# Patient Record
Sex: Male | Born: 1935 | Race: White | Hispanic: No | Marital: Married | Smoking: Former smoker
Health system: Southern US, Community
[De-identification: ages and names within clinical notes are randomized; demographics above are authoritative.]

## PROBLEM LIST (undated history)

## (undated) DIAGNOSIS — C61 Malignant neoplasm of prostate: Secondary | ICD-10-CM

## (undated) DIAGNOSIS — C801 Malignant (primary) neoplasm, unspecified: Secondary | ICD-10-CM

## (undated) HISTORY — PX: COLOSTOMY: SHX63

## (undated) HISTORY — PX: MELANOMA EXCISION: SHX5266

---

## 1989-05-30 HISTORY — PX: PROSTATE SURGERY: SHX751

## 2014-05-30 DIAGNOSIS — C19 Malignant neoplasm of rectosigmoid junction: Secondary | ICD-10-CM

## 2014-05-30 HISTORY — DX: Malignant neoplasm of rectosigmoid junction: C19

## 2019-02-22 ENCOUNTER — Telehealth: Payer: Self-pay | Admitting: Oncology

## 2019-02-22 NOTE — Telephone Encounter (Signed)
I received a referral from [facility] for Cody Hurley to establish care with [name]. I attempted 2x to call the number provider but the phone line was busy. I will try back at a later time.

## 2019-03-11 ENCOUNTER — Telehealth: Payer: Self-pay | Admitting: Oncology

## 2019-03-11 NOTE — Telephone Encounter (Signed)
Spoke with wife re 10/26 new patient appointment with [name]. Per wife insurance is medicare and freedom blue. Patient will bring insurance card to visit. Reached out per GI navigator, transfer from [place], [facility] would like to see patient before end of month.

## 2019-03-12 ENCOUNTER — Telehealth: Payer: Self-pay

## 2019-03-12 ENCOUNTER — Telehealth: Payer: Self-pay | Admitting: Oncology

## 2019-03-12 NOTE — Telephone Encounter (Signed)
Was able to reach patient's wife. She reports that patient records should be at [facility] in [place]. Request for information faxed to medical records.

## 2019-03-12 NOTE — Telephone Encounter (Signed)
Attempted to call patient multiple times. Busy signal each time.

## 2019-03-12 NOTE — Telephone Encounter (Signed)
Patient scheduled with [name] on 10/26 for initial appointment for rectal cancer surveillance. Dr. Morton Stall has sent referral packet with information regarding lower anterior resection of rectum performed in 2017. I called patient and left VM requesting a call back to discuss hx of chemotherapy so that records can be obtained an I am waiting call back. Staff message sent to HIM for assistance.

## 2019-03-12 NOTE — Telephone Encounter (Signed)
I attempted to call Cody Hurley to schedule an appt, but when I do, the phone line is busy. I will mail the pt a letter w/the appt on 10/26 w/[name].

## 2019-03-13 ENCOUNTER — Encounter: Payer: Self-pay | Admitting: Oncology

## 2019-03-13 ENCOUNTER — Telehealth: Payer: Self-pay | Admitting: Oncology

## 2019-03-13 NOTE — Telephone Encounter (Signed)
Letter mailed

## 2019-03-22 ENCOUNTER — Telehealth: Payer: Self-pay

## 2019-03-22 NOTE — Telephone Encounter (Signed)
Called and spoke with Cody Hurley to advise that I have not been successful getting copies of patient's oncology medical records. Oncologist's office was purchased by [employer]. She will reach out to medical records at [facility] to assist.

## 2019-03-25 ENCOUNTER — Telehealth: Payer: Self-pay | Admitting: Oncology

## 2019-03-25 ENCOUNTER — Inpatient Hospital Stay: Payer: Medicare Other | Attending: Oncology | Admitting: Oncology

## 2019-03-25 ENCOUNTER — Other Ambulatory Visit: Payer: Self-pay

## 2019-03-25 VITALS — BP 145/69 | HR 70 | Temp 98.7°F | Resp 18 | Wt 175.4 lb

## 2019-03-25 DIAGNOSIS — C20 Malignant neoplasm of rectum: Secondary | ICD-10-CM

## 2019-03-25 NOTE — Telephone Encounter (Signed)
Scheduled per 10/26 los, patient received after visit summary and calender.  °

## 2019-03-25 NOTE — Progress Notes (Signed)
[facility] New Patient Consult   Requesting MD: Cheryll Cockayne, [facility] [place],  [place] [address]   Rafael Bihari 85 y.o.  1925-07-11    Reason for Consult: Rectal cancer   HPI: Mr. Pacek was diagnosed with rectal cancer in November 2016 with a tumor at 10 cm from the anus.  He was treated with neoadjuvant Xeloda and radiation in [place] beginning on June 01, 2015.  He completed 20 out of 28 planned doses of radiation when he developed severe diarrhea and was hospitalized.  He was transferred to [facility].  He reports being hospitalized for over 2 weeks.  He was referred to Dr. Morton Stall.  The tumor was staged as a clinical T3 N0 rectal cancer based on an endoscopic ultrasound 05/15/2015.  He underwent a Hartman's procedure in September 2017 with the final pathology confirming a moderate to poorly differentiated adenocarcinoma,ypT4aN2a, with negative resection margins.  Mr. Garris was seen by medical oncology at [facility].  Adjuvant chemotherapy was not recommended.  He has been followed by medical oncology in [place] and by Dr. Rudean Hitt the past several years.  He last underwent CTs in April 2019.  There was a new 6 mm right upper lobe nodule, no evidence of metastatic disease in the abdomen or pelvis.  The CEA returned at 1.8 on 09/26/2017, the CEA was elevated at presentation.  Mr. Liefer lives in [place].  He relocates to [place] each winter.  He reports feeling well at present.  He is referred for ongoing oncology follow-up.  Past medical history: 1.  Stage III rectal cancer, 2017,ypT4aN2 2.  Prostate cancer in 1990s 3.  TIA 12 years ago 4.  Carpal tunnel syndrome 5.  History of multiple skin cancers  Past surgical history: 1.  Low anterior resection September 2017 2.  Prostatectomy 1990  Family history: No family history of cancer   Medications: Reviewed  Allergies: Penicillin G  Social  History:   He lives with his wife in [place].  He worked as a Manufacturing engineer.  He quit smoking cigarettes in 1951.  No alcohol use.  He received autologous blood at the prostatectomy procedure  ROS:   Positives include: Low back pain following rectal surgery, right hand pain-not improved by carpal tunnel surgery urinary incontinence since the prostatectomy procedure  A complete ROS was otherwise negative.  Physical Exam:  Blood pressure (!) 145/69, pulse 70, temperature 98.7 F (37.1 C), temperature source Temporal, resp. rate 18, weight 175 lb 6.4 oz (79.6 kg), SpO2 98 %.  HEENT: Neck without mass Lungs: Scattered end inspiratory rhonchi, no respiratory distress Cardiac: Regular rhythm with premature beats Abdomen: The liver edge is palpable a few fingers below the right costal margin at the medial right costal margin and lateral costal margin, the liver edge is smooth.  No tenderness.  No splenomegaly.  No mass.  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 Stage III rectal cancer,ypT4a,N2 status post a low anterior resection in September 2017  Presenting [place] 2016 with a mass at 10 cm from the anus, clinical stage II by EUS-T3N0  Neoadjuvant Xeloda and radiation beginning 06/01/2015, completed  20 of 28 planned fractions, discontinued secondary to toxicity  Elevated pretreatment CEA  2. Prostate cancer 1990 treated with a prostatectomy 3. History of a TIA 4. Carpal tunnel syndrome   Disposition:   Mr. Palacio was diagnosed with rectal cancer in November 2016.  He remains in clinical remission.  He last underwent restaging  CTs and a CEA in April 2019.  The CT showed no evidence of metastatic disease in the abdomen or pelvis.  He had a new lung nodule.  He is now 4 years out from diagnosis, but remains at risk of developing recurrent disease.  Mr. Weiand appears well.  I can feel the liver edge on exam today.  This is likely a benign finding.  I will refer him for an abdominal ultrasound to look for hepatomegaly and liver lesions.  We will check the CEA on the day of the ultrasound procedure.  We will also check a chest CT to follow-up on the lung nodule noted 2019.  Mr. Calvi will return for an office visit in 6 months if the above evaluation is negative.  Betsy Coder, MD  03/25/2019, 2:13 PM

## 2019-03-27 ENCOUNTER — Telehealth: Payer: Self-pay | Admitting: *Deleted

## 2019-03-27 NOTE — Telephone Encounter (Addendum)
Per [name]: Needs CEA day of abd [place] and also needs CT chest on same day to f/u nodule noted on CT at [facility] 08/2017. Left VM for patient to call to discuss MD new orders. Mrs. Deats returned call and was provided rationale for CEA and CT scan. She understands and agrees.

## 2019-04-08 ENCOUNTER — Other Ambulatory Visit: Payer: Medicare Other

## 2019-04-10 ENCOUNTER — Ambulatory Visit (HOSPITAL_COMMUNITY)
Admission: RE | Admit: 2019-04-10 | Discharge: 2019-04-10 | Disposition: A | Discharge status: Home / Self Care | Payer: Medicare Other | Source: Ambulatory Visit | Attending: Oncology | Admitting: Oncology

## 2019-04-10 ENCOUNTER — Ambulatory Visit
Admission: RE | Admit: 2019-04-10 | Discharge: 2019-04-10 | Disposition: A | Discharge status: Home / Self Care | Payer: Self-pay | Source: Ambulatory Visit | Attending: Oncology | Admitting: Oncology

## 2019-04-10 ENCOUNTER — Other Ambulatory Visit: Payer: Self-pay | Admitting: *Deleted

## 2019-04-10 ENCOUNTER — Encounter (HOSPITAL_COMMUNITY): Payer: Self-pay

## 2019-04-10 ENCOUNTER — Other Ambulatory Visit: Payer: Self-pay

## 2019-04-10 ENCOUNTER — Inpatient Hospital Stay: Payer: Medicare Other | Attending: Oncology

## 2019-04-10 DIAGNOSIS — C20 Malignant neoplasm of rectum: Secondary | ICD-10-CM

## 2019-04-10 HISTORY — DX: Malignant (primary) neoplasm, unspecified: C80.1

## 2019-04-10 LAB — CEA (IN HOUSE-CHCC): CEA (CHCC-In House): 1.67 ng/mL (ref 0.00–5.00)

## 2019-04-11 ENCOUNTER — Telehealth: Payer: Self-pay | Admitting: *Deleted

## 2019-04-11 NOTE — Telephone Encounter (Signed)
Wife called back to request last office note and xray reports be mailed to home since they go to [place] for the winter and want to have these records in case something happens there. Placed in mail as requested.

## 2019-04-11 NOTE — Telephone Encounter (Signed)
-----   Message from Ladell Pier, MD sent at 04/10/2019  8:51 PM EST ----- Please call patient, [place] and CT show no evidence of recurrent cancer, appears to have early changes of cirrhosis, can f/u with primary MD for this, f/u here as scheduled, copy CT and [place] report to primary MD

## 2019-04-11 NOTE — Telephone Encounter (Signed)
Per [name], called pt and talked to wife to make aware of [place] and CT showing no evidence of recurrent cancer, but appears to have changes of cirrhosis and can f/u with PCP for that and f/u here as scheduled. Wife verbalized understanding. Copy of CT and [place] report was faxed to PCP.

## 2019-10-24 ENCOUNTER — Inpatient Hospital Stay: Payer: Medicare Other | Attending: Oncology | Admitting: Oncology

## 2019-10-24 ENCOUNTER — Other Ambulatory Visit: Payer: Self-pay

## 2019-10-24 VITALS — BP 159/56 | HR 60 | Temp 98.1°F | Resp 16 | Wt 172.4 lb

## 2019-10-24 DIAGNOSIS — Z8673 Personal history of transient ischemic attack (TIA), and cerebral infarction without residual deficits: Secondary | ICD-10-CM | POA: Insufficient documentation

## 2019-10-24 DIAGNOSIS — C20 Malignant neoplasm of rectum: Secondary | ICD-10-CM | POA: Insufficient documentation

## 2019-10-24 DIAGNOSIS — Z8546 Personal history of malignant neoplasm of prostate: Secondary | ICD-10-CM | POA: Diagnosis not present

## 2019-10-24 DIAGNOSIS — Z9079 Acquired absence of other genital organ(s): Secondary | ICD-10-CM | POA: Insufficient documentation

## 2019-10-24 DIAGNOSIS — K760 Fatty (change of) liver, not elsewhere classified: Secondary | ICD-10-CM | POA: Insufficient documentation

## 2019-10-24 DIAGNOSIS — K469 Unspecified abdominal hernia without obstruction or gangrene: Secondary | ICD-10-CM | POA: Insufficient documentation

## 2019-10-24 DIAGNOSIS — Z933 Colostomy status: Secondary | ICD-10-CM | POA: Insufficient documentation

## 2019-10-24 DIAGNOSIS — G5600 Carpal tunnel syndrome, unspecified upper limb: Secondary | ICD-10-CM | POA: Diagnosis not present

## 2019-10-24 NOTE — Progress Notes (Signed)
  Cody Hurley   Diagnosis: Rectal cancer  INTERVAL HISTORY:   Cody Hurley returns as scheduled.  He has returned from several months in [place].  Good appetite and energy level.  He has discomfort at the bilateral sacral/hip areas.  This is worse with activity.  He has a hernia surrounding the colostomy.  Objective:  Vital signs in last 24 hours:  Blood pressure (!) 159/56, pulse 60, temperature 98.1 F (36.7 C), temperature source Temporal, resp. rate 16, weight 172 lb 6.4 oz (78.2 kg), SpO2 95 %.    Lymphatics: No cervical, supraclavicular, axillary, or inguinal nodes Resp: Scattered end inspiratory rhonchi Cardio: Regular rate and rhythm GI: Nontender, left lower quadrant colostomy with a parastomal hernia, liver edge is palpable a few fingers below the medial right costal margin.  No splenomegaly.  Nontender. Vascular: No leg edema   Lab Results:  No results found for: WBC, HGB, HCT, MCV, PLT, NEUTROABS  CMP  No results found for: NA, K, CL, CO2, GLUCOSE, BUN, CREATININE, CALCIUM, PROT, ALBUMIN, AST, ALT, ALKPHOS, BILITOT, GFRNONAA, GFRAA  Lab Results  Component Value Date   CEA1 1.67 04/10/2019    Medications: I have reviewed the patient's current medications.   Assessment/Plan: 1. Stage III rectal cancer,ypT4a,N2 status post a low anterior resection in September 2017  Presenting November 2016 with a mass at 10 cm from the anus, clinical stage II by EUS-T3N0  Neoadjuvant Xeloda and radiation beginning 06/01/2015, completed 20 of 28 planned fractions, discontinued secondary to toxicity  Elevated pretreatment CEA  CT chest 04/10/2019-no evidence of metastatic disease, changes of interstitial lung disease, changes suggestive of cirrhosis  2. Prostate cancer 1990 treated with a prostatectomy 3. History of a TIA 4. Carpal tunnel syndrome 5. Palpable liver edge-ultrasound abdomen 04/10/2019-fatty infiltration of the liver, normal  portal vein flow   Disposition: Cody Hurley is in clinical remission from rectal cancer.  He will return for an office visit and CEA in 6 months.  He is scheduled to see Dr. Morton Stall in August.  I recommended he discuss management of the parastomal hernia with Dr. Morton Stall. The lower back/hip discomfort is likely related to a benign musculoskeletal condition.   Betsy Coder, MD  10/24/2019  3:27 PM

## 2020-04-20 ENCOUNTER — Inpatient Hospital Stay: Payer: Medicare Other | Admitting: Oncology

## 2020-07-06 ENCOUNTER — Telehealth: Payer: Self-pay | Admitting: *Deleted

## 2020-07-06 NOTE — Telephone Encounter (Addendum)
Wife called--Trying to get an appointment at Hematology/Oncology in [place]. [place], [place] due to patient having diarrhea issues and they will not see him until they receive some records. Fax [phone]  AttEdwena Blow Ph # [phone] Last office note, demographics, CEA, CT report and med list faxed as requested by wife.

## 2020-10-21 ENCOUNTER — Telehealth: Payer: Self-pay

## 2020-10-21 NOTE — Telephone Encounter (Signed)
TC from Pt's wife stating she has to cancel appointment for 10/22/20 because she is not feeling well. Appointment canceled. Scheduling message sent to reschedule.

## 2020-10-22 ENCOUNTER — Ambulatory Visit: Payer: Medicare Other | Admitting: Oncology

## 2020-10-28 ENCOUNTER — Ambulatory Visit: Payer: Medicare Other | Admitting: Oncology

## 2020-11-16 ENCOUNTER — Ambulatory Visit: Payer: Medicare Other | Admitting: Oncology

## 2020-11-18 ENCOUNTER — Inpatient Hospital Stay: Payer: Medicare Other | Attending: Oncology | Admitting: Oncology

## 2020-11-18 ENCOUNTER — Other Ambulatory Visit: Payer: Self-pay

## 2020-11-18 ENCOUNTER — Inpatient Hospital Stay: Payer: Medicare Other

## 2020-11-18 VITALS — BP 124/65 | HR 95 | Temp 97.8°F | Resp 20 | Ht 67.0 in | Wt 148.0 lb

## 2020-11-18 DIAGNOSIS — C7971 Secondary malignant neoplasm of right adrenal gland: Secondary | ICD-10-CM | POA: Diagnosis present

## 2020-11-18 DIAGNOSIS — C20 Malignant neoplasm of rectum: Secondary | ICD-10-CM | POA: Insufficient documentation

## 2020-11-18 DIAGNOSIS — C7972 Secondary malignant neoplasm of left adrenal gland: Secondary | ICD-10-CM | POA: Diagnosis present

## 2020-11-18 DIAGNOSIS — Z8616 Personal history of COVID-19: Secondary | ICD-10-CM | POA: Diagnosis not present

## 2020-11-18 DIAGNOSIS — Z8673 Personal history of transient ischemic attack (TIA), and cerebral infarction without residual deficits: Secondary | ICD-10-CM | POA: Insufficient documentation

## 2020-11-18 DIAGNOSIS — R5381 Other malaise: Secondary | ICD-10-CM | POA: Diagnosis not present

## 2020-11-18 DIAGNOSIS — I2693 Single subsegmental pulmonary embolism without acute cor pulmonale: Secondary | ICD-10-CM | POA: Insufficient documentation

## 2020-11-18 DIAGNOSIS — R5383 Other fatigue: Secondary | ICD-10-CM

## 2020-11-18 DIAGNOSIS — Z8546 Personal history of malignant neoplasm of prostate: Secondary | ICD-10-CM | POA: Insufficient documentation

## 2020-11-18 LAB — CBC WITH DIFFERENTIAL (CANCER CENTER ONLY)
Abs Immature Granulocytes: 0.02 10*3/uL (ref 0.00–0.07)
Basophils Absolute: 0 10*3/uL (ref 0.0–0.1)
Basophils Relative: 1 %
Eosinophils Absolute: 0.2 10*3/uL (ref 0.0–0.5)
Eosinophils Relative: 2 %
HCT: 43.6 % (ref 39.0–52.0)
Hemoglobin: 14.5 g/dL (ref 13.0–17.0)
Immature Granulocytes: 0 %
Lymphocytes Relative: 11 %
Lymphs Abs: 0.7 10*3/uL (ref 0.7–4.0)
MCH: 34 pg (ref 26.0–34.0)
MCHC: 33.3 g/dL (ref 30.0–36.0)
MCV: 102.3 fL — ABNORMAL HIGH (ref 80.0–100.0)
Monocytes Absolute: 1 10*3/uL (ref 0.1–1.0)
Monocytes Relative: 16 %
Neutro Abs: 4.4 10*3/uL (ref 1.7–7.7)
Neutrophils Relative %: 70 %
Platelet Count: 115 10*3/uL — ABNORMAL LOW (ref 150–400)
RBC: 4.26 MIL/uL (ref 4.22–5.81)
RDW: 15.6 % — ABNORMAL HIGH (ref 11.5–15.5)
WBC Count: 6.3 10*3/uL (ref 4.0–10.5)
nRBC: 0 % (ref 0.0–0.2)

## 2020-11-18 LAB — CMP (CANCER CENTER ONLY)
ALT: 44 U/L (ref 0–44)
AST: 59 U/L — ABNORMAL HIGH (ref 15–41)
Albumin: 3.2 g/dL — ABNORMAL LOW (ref 3.5–5.0)
Alkaline Phosphatase: 80 U/L (ref 38–126)
Anion gap: 12 (ref 5–15)
BUN: 15 mg/dL (ref 8–23)
CO2: 27 mmol/L (ref 22–32)
Calcium: 10.4 mg/dL — ABNORMAL HIGH (ref 8.9–10.3)
Chloride: 97 mmol/L — ABNORMAL LOW (ref 98–111)
Creatinine: 0.57 mg/dL — ABNORMAL LOW (ref 0.61–1.24)
GFR, Estimated: >60 mL/min (ref >60)
Glucose, Bld: 70 mg/dL (ref 70–99)
Potassium: 4.1 mmol/L (ref 3.5–5.1)
Sodium: 136 mmol/L (ref 135–145)
Total Bilirubin: 1.3 mg/dL — ABNORMAL HIGH (ref 0.3–1.2)
Total Protein: 6.5 g/dL (ref 6.5–8.1)

## 2020-11-18 LAB — CEA (ACCESS): CEA (CHCC): 2.12 ng/mL (ref 0.00–5.00)

## 2020-11-18 NOTE — Progress Notes (Signed)
  [facility] OFFICE PROGRESS NOTE   Diagnosis: Rectal cancer  INTERVAL HISTORY:   Cody Hurley returns for a scheduled visit.  He is here with his wife.  He was diagnosed with COVID-19 infection approximately 10 days ago and completed a course of antiviral therapy.  He had a cough and sore throat.  He complains of malaise and anorexia.  He has lost approximately 20 pounds over the past 3 months.  He reports undergoing a negative restaging CT evaluation in March while in [place].  No pain or dyspnea.  No difficulty with the colostomy.  He has lower extremity edema.  Objective:  Vital signs in last 24 hours:  Blood pressure 124/65, pulse 95, temperature 97.8 F (36.6 C), temperature source Oral, resp. rate 20, height 5\' 7"  (1.702 m), weight 148 lb (67.1 kg), SpO2 97 %.    Lymphatics: No cervical, supraclavicular, axillary, or inguinal nodes Resp: Lungs clear bilaterally Cardio: Regular rate and rhythm GI: No splenomegaly, smooth palpable liver edge in the medial right subcostal region, no mass, nontender, left lower quadrant colostomy with brown stool Vascular: Trace pitting edema at the lower leg and ankle bilaterally    Lab Results:  Lab Results  Component Value Date   WBC 6.3 11/18/2020   HGB 14.5 11/18/2020   HCT 43.6 11/18/2020   MCV 102.3 (H) 11/18/2020   PLT 115 (L) 11/18/2020   NEUTROABS 4.4 11/18/2020    CMP  Lab Results  Component Value Date   NA 136 11/18/2020   K 4.1 11/18/2020   CL 97 (L) 11/18/2020   CO2 27 11/18/2020   GLUCOSE 70 11/18/2020   BUN 15 11/18/2020   CREATININE 0.57 (L) 11/18/2020   CALCIUM 10.4 (H) 11/18/2020   PROT 6.5 11/18/2020   ALBUMIN 3.2 (L) 11/18/2020   AST 59 (H) 11/18/2020   ALT 44 11/18/2020   ALKPHOS 80 11/18/2020   BILITOT 1.3 (H) 11/18/2020   GFRNONAA >60 11/18/2020    Lab Results  Component Value Date   CEA1 1.67 04/10/2019     Medications: I have reviewed the patient's current  medications.   Assessment/Plan:  Stage III rectal cancer,ypT4a,N2 status post a low anterior resection in September 2017 Presenting November 2016 with a mass at 10 cm from the anus, clinical stage II by EUS-T3N0 Neoadjuvant Xeloda and radiation beginning 06/01/2015, completed 20 of 28 planned fractions, discontinued secondary to toxicity Elevated pretreatment CEA CT chest 04/10/2019-no evidence of metastatic disease, changes of interstitial lung disease, changes suggestive of cirrhosis  Prostate cancer 1990 treated with a prostatectomy History of a TIA Carpal tunnel syndrome Palpable liver edge-ultrasound abdomen 04/10/2019-fatty infiltration of the liver, normal portal vein flow    Disposition: Cody Hurley has a history of rectal cancer.  He is almost 5 years out from diagnosis.  He remains in clinical remission.  The etiology of his weight loss and malaise is unclear.  I have a low clinical suspicion for recurrent rectal cancer.  He had a normal TSH at the [place] last month (his wife brought in lab reports).  We will check a CBC, chemistry panel, and CEA today.  He will return for an office visit in 1 month.  Betsy Coder, MD  11/18/2020  1:15 PM

## 2020-11-19 ENCOUNTER — Telehealth: Payer: Self-pay | Admitting: *Deleted

## 2020-11-19 LAB — CEA (IN HOUSE-CHCC): CEA (CHCC-In House): 2.57 ng/mL (ref 0.00–5.00)

## 2020-11-19 NOTE — Telephone Encounter (Signed)
Notified wife that an envelope with papers from [facility] were found in the restroom last night. She requests these be mailed to them with lab results from this week. Explained the CEA results to her on the phone as well as reason for the two results--parallel testing to compare lab methods.

## 2020-11-22 ENCOUNTER — Encounter (HOSPITAL_COMMUNITY): Payer: Self-pay | Admitting: *Deleted

## 2020-11-22 ENCOUNTER — Observation Stay (HOSPITAL_COMMUNITY)
Admission: EM | Admit: 2020-11-22 | Discharge: 2020-11-22 | Disposition: A | Discharge status: Home / Self Care | Payer: Medicare Other | Attending: Internal Medicine | Admitting: Internal Medicine

## 2020-11-22 ENCOUNTER — Emergency Department (HOSPITAL_COMMUNITY): Payer: Medicare Other

## 2020-11-22 ENCOUNTER — Other Ambulatory Visit: Payer: Self-pay

## 2020-11-22 DIAGNOSIS — R5383 Other fatigue: Secondary | ICD-10-CM | POA: Insufficient documentation

## 2020-11-22 DIAGNOSIS — U071 COVID-19: Secondary | ICD-10-CM

## 2020-11-22 DIAGNOSIS — R059 Cough, unspecified: Secondary | ICD-10-CM

## 2020-11-22 DIAGNOSIS — Z85038 Personal history of other malignant neoplasm of large intestine: Secondary | ICD-10-CM | POA: Diagnosis not present

## 2020-11-22 DIAGNOSIS — Z8546 Personal history of malignant neoplasm of prostate: Secondary | ICD-10-CM | POA: Diagnosis not present

## 2020-11-22 DIAGNOSIS — I2694 Multiple subsegmental pulmonary emboli without acute cor pulmonale: Secondary | ICD-10-CM

## 2020-11-22 DIAGNOSIS — R630 Anorexia: Secondary | ICD-10-CM | POA: Diagnosis not present

## 2020-11-22 DIAGNOSIS — R0602 Shortness of breath: Secondary | ICD-10-CM | POA: Diagnosis not present

## 2020-11-22 DIAGNOSIS — R531 Weakness: Secondary | ICD-10-CM | POA: Diagnosis present

## 2020-11-22 DIAGNOSIS — I2699 Other pulmonary embolism without acute cor pulmonale: Secondary | ICD-10-CM | POA: Diagnosis present

## 2020-11-22 HISTORY — DX: Malignant neoplasm of prostate: C61

## 2020-11-22 LAB — D-DIMER, QUANTITATIVE: D-Dimer, Quant: 10.82 ug/mL-FEU — ABNORMAL HIGH (ref 0.00–0.50)

## 2020-11-22 LAB — COMPREHENSIVE METABOLIC PANEL
ALT: 40 U/L (ref 0–44)
AST: 62 U/L — ABNORMAL HIGH (ref 15–41)
Albumin: 2.8 g/dL — ABNORMAL LOW (ref 3.5–5.0)
Alkaline Phosphatase: 89 U/L (ref 38–126)
Anion gap: 13 (ref 5–15)
BUN: 15 mg/dL (ref 8–23)
CO2: 25 mmol/L (ref 22–32)
Calcium: 10.1 mg/dL (ref 8.9–10.3)
Chloride: 97 mmol/L — ABNORMAL LOW (ref 98–111)
Creatinine, Ser: 0.63 mg/dL (ref 0.61–1.24)
GFR, Estimated: >60 mL/min (ref >60)
Glucose, Bld: 69 mg/dL — ABNORMAL LOW (ref 70–99)
Potassium: 4.1 mmol/L (ref 3.5–5.1)
Sodium: 135 mmol/L (ref 135–145)
Total Bilirubin: 1.1 mg/dL (ref 0.3–1.2)
Total Protein: 6.3 g/dL — ABNORMAL LOW (ref 6.5–8.1)

## 2020-11-22 LAB — CBC WITH DIFFERENTIAL/PLATELET
Abs Immature Granulocytes: 0.01 10*3/uL (ref 0.00–0.07)
Basophils Absolute: 0.1 10*3/uL (ref 0.0–0.1)
Basophils Relative: 1 %
Eosinophils Absolute: 0.2 10*3/uL (ref 0.0–0.5)
Eosinophils Relative: 4 %
HCT: 40.9 % (ref 39.0–52.0)
Hemoglobin: 13.5 g/dL (ref 13.0–17.0)
Immature Granulocytes: 0 %
Lymphocytes Relative: 16 %
Lymphs Abs: 0.8 10*3/uL (ref 0.7–4.0)
MCH: 34.5 pg — ABNORMAL HIGH (ref 26.0–34.0)
MCHC: 33 g/dL (ref 30.0–36.0)
MCV: 104.6 fL — ABNORMAL HIGH (ref 80.0–100.0)
Monocytes Absolute: 0.8 10*3/uL (ref 0.1–1.0)
Monocytes Relative: 17 %
Neutro Abs: 3.1 10*3/uL (ref 1.7–7.7)
Neutrophils Relative %: 62 %
Platelets: 120 10*3/uL — ABNORMAL LOW (ref 150–400)
RBC: 3.91 MIL/uL — ABNORMAL LOW (ref 4.22–5.81)
RDW: 15.4 % (ref 11.5–15.5)
WBC: 5 10*3/uL (ref 4.0–10.5)
nRBC: 0 % (ref 0.0–0.2)

## 2020-11-22 LAB — BRAIN NATRIURETIC PEPTIDE: B Natriuretic Peptide: 263 pg/mL — ABNORMAL HIGH (ref 0.0–100.0)

## 2020-11-22 LAB — TROPONIN I (HIGH SENSITIVITY)
Troponin I (High Sensitivity): 9 ng/L (ref <18)
Troponin I (High Sensitivity): 9 ng/L (ref <18)

## 2020-11-22 MED ORDER — IOHEXOL 350 MG/ML SOLN
100.0000 mL | Freq: Once | INTRAVENOUS | Status: AC | PRN
  Administered 2020-11-22: 100 mL via INTRAVENOUS

## 2020-11-22 MED ORDER — APIXABAN 5 MG PO TABS
10.0000 mg | ORAL_TABLET | Freq: Once | ORAL | Status: AC
  Administered 2020-11-22: 10 mg via ORAL
  Filled 2020-11-22: qty 2

## 2020-11-22 MED ORDER — APIXABAN (ELIQUIS) VTE STARTER PACK (10MG AND 5MG): ORAL_TABLET | ORAL | 0 refills | Status: DC

## 2020-11-22 NOTE — Progress Notes (Signed)
Called to admit patient Cody Hurley, 87 year old male with history of rectal and prostatic cancer, also colostomy status admitted with complaints of weakness, decreased appetite cough, and positive COVID test 6/13.  D-dimer markedly elevated at 10, subsequent CT chest with contrast showed subsegmental PE in lingula and left lower lobe, also scattered nonspecific airspace opacity, most conspicuous at the bilateral lung bases.  Also showed ill-defined soft tissue stranding about right kidney suspicious for malignancy, and new bilateral adrenal nodules concerning for metastatic disease.  Patient was treated with steroids and antibiotics as outpatient for COVID infection. On my entering the room, spouse present at bedside.  Patient and spouse asked if patient had a room right now they were tired of waiting and wanted to leave.  Unfortunately at the time of my evaluation patient did not have an assigned room, and they are both unwilling to wait.    On visual examination only, patient appeared comfortable, did not appear dyspneic, O2 sats > 93% on room air.  Assessment and plan- - would recommend starting Eliquis twice daily, pharmacy to determine dose.   -May benefit from continuing short course of steroids, ~ 5 more days for Ct lung findings.  No benefit to giving remdesivir at this point.   - Close follow-up with his oncologist as outpatient -considering CTA findings, he will need dedicated CT abdomen and pelvis with contrast.  LOS- No CHARGE.  Arlyce Dice, MD. TRH. 11/22/20 6:38 PM

## 2020-11-22 NOTE — ED Triage Notes (Signed)
Pt's wife reports pt was diagnosed with Covid on 11/09/20. She reports he was been very weak, had loss of appetite, hacking cough, new BLE swelling, loss of 20lbs. Pt was given antibiotic and steroids when he was diagnosed with Covid.

## 2020-11-22 NOTE — ED Provider Notes (Signed)
[facility] EMERGENCY DEPARTMENT Provider Note   CSN: [id number] Arrival date & time: 11/22/20  1043     History Chief Complaint  Patient presents with   Weakness    Covid +    Cody Hurley is a 87 y.o. male.  HPI  87 year old male with past medical history of prostate/colorectal cancer status post colostomy, presents to the emergency department with ongoing COVID symptoms.  Patient was diagnosed almost 2 weeks ago with COVID.  Initially placed on steroids and antibiotics.  The wife at bedside states that he has had a steady decline over the last week.  Becoming more weak, decreased appetite.  Intermittently productive cough and weight loss.  Patient has been having intermittent chest pain with coughs.  He feels short of breath and fatigued with any activity.  He has had mild swelling of his b/l ankles that gets worse throughout the day is resolved in the mornings.   Past Medical History:  Diagnosis Date   Colorectal cancer ([place]) 2016   Prostate cancer ([place])     There are no problems to display for this patient.   Past Surgical History:  Procedure Laterality Date   COLOSTOMY     MELANOMA EXCISION     ear   PROSTATE SURGERY  1991       No family history on file.  Social History   Tobacco Use   Smoking status: Never   Smokeless tobacco: Never  Vaping Use   Vaping Use: Never used  Substance Use Topics   Alcohol use: Never   Drug use: Never    Home Medications Prior to Admission medications   Medication Sig Start Date End Date Taking? Authorizing Provider  amLODipine (NORVASC) 5 MG tablet Take 5 mg by mouth daily. Patient not taking: Reported on 11/18/2020    [provider]  Cholecalciferol (VITAMIN D-1000 MAX ST) 25 MCG (1000 UT) tablet Take 2,000 Units by mouth daily.    [provider]  clopidogrel (PLAVIX) 75 MG tablet Take 75 mg by mouth daily. Patient not taking: Reported on 11/18/2020    [provider]  Multiple Vitamin  (MULTIVITAMIN) tablet Take 1 tablet by mouth daily.    [provider]  oxybutynin (DITROPAN) 5 MG tablet Take 5 mg by mouth daily. 09/18/20   [provider]  simvastatin (ZOCOR) 20 MG tablet Take 20 mg by mouth daily. Patient not taking: Reported on 11/18/2020    [provider]    Allergies    Penicillin g  Review of Systems   Review of Systems  Constitutional:  Positive for appetite change and fatigue. Negative for chills and fever.  HENT:  Negative for congestion.   Eyes:  Negative for visual disturbance.  Respiratory:  Positive for cough. Negative for shortness of breath.   Cardiovascular:  Positive for chest pain and leg swelling. Negative for palpitations.  Gastrointestinal:  Negative for abdominal pain, diarrhea and vomiting.  Genitourinary:  Negative for dysuria.  Musculoskeletal:  Negative for back pain and neck pain.  Skin:  Negative for rash.  Neurological:  Negative for headaches.   Physical Exam Updated Vital Signs BP 116/61   Pulse 72   Temp 98.6 F (37 C) (Oral)   Resp (!) 22   Ht 5\' 7"  (1.702 m)   Wt 67.1 kg   SpO2 96%   BMI 23.18 kg/m   Physical Exam Vitals and nursing note reviewed.  Constitutional:      General: He is not in acute  distress.    Appearance: Normal appearance. He is not diaphoretic.  HENT:     Head: Normocephalic.     Mouth/Throat:     Mouth: Mucous membranes are moist.  Cardiovascular:     Rate and Rhythm: Normal rate.  Pulmonary:     Effort: Pulmonary effort is normal. No respiratory distress.     Comments: Decreased at bases Abdominal:     Palpations: Abdomen is soft.     Tenderness: There is no abdominal tenderness.  Musculoskeletal:     Comments: Trace edema of b/l ankles, no calf swelling  Skin:    General: Skin is warm.  Neurological:     Mental Status: He is alert and oriented to person, place, and time. Mental status is at baseline.  Psychiatric:        Mood and Affect: Mood normal.     ED Results / Procedures / Treatments   Labs (all labs ordered are listed, but only abnormal results are displayed) Labs Reviewed  CBC WITH DIFFERENTIAL/PLATELET - Abnormal; Notable for the following components:      Result Value   RBC 3.91 (*)    MCV 104.6 (*)    MCH 34.5 (*)    Platelets 120 (*)    All other components within normal limits  COMPREHENSIVE METABOLIC PANEL  BRAIN NATRIURETIC PEPTIDE  TROPONIN I (HIGH SENSITIVITY)    EKG None  Radiology No results found.  Procedures Procedures   Medications Ordered in ED Medications - No data to display  ED Course  I have reviewed the triage vital signs and the nursing notes.  Pertinent labs & imaging results that were available during my care of the patient were reviewed by me and considered in my medical decision making (see chart for details).    MDM Rules/Calculators/A&P                          87 year old male presents emergency department with ongoing COVID symptoms including chest pain, shortness of breath, cough, fatigue, decreased appetite, ankle swelling.  He is intermittently tachypneic but otherwise stable vitals, afebrile.  Hard of hearing but seems comfortable at this time.  Lung sounds are clear, trace edema the bilateral ankles.  Blood work is reassuring, first troponin is negative, BNP is just slightly elevated, chest x-ray is clear without any evidence of pneumonia or CHF.  Given his history of cancer and COVID with chest pain/shortness of breath/tachypnea D-dimer was done which was significantly elevated.  He is pending CT PE study.  If this is negative I do anticipate that patient could go home to continue symptomatic treatment for COVID.  Patient and wife are currently in agreement with this plan.  Patient signed out pending CT PE study and reevaluation.  Final Clinical Impression(s) / ED Diagnoses Final diagnoses:  None    Rx / DC Orders ED Discharge Orders     None        Lorelle Gibbs, DO 11/22/20 1539

## 2020-11-22 NOTE — Discharge Instructions (Addendum)
Follow-up with [name].  Take the new medicine to help with the blood clot.

## 2020-11-22 NOTE — ED Notes (Signed)
Patient refusing to allow staff to assist with ADL. Repeatedly states that she needs to use bathroom and attempting to get OOB. Refusing further VS and yells when BP cuff placed on arm.

## 2020-11-22 NOTE — ED Provider Notes (Signed)
  Physical Exam  BP (!) 116/58   Pulse 72   Temp 98.6 F (37 C) (Oral)   Resp 20   Ht 5\' 7"  (1.702 m)   Wt 67.1 kg   SpO2 97%   BMI 23.18 kg/m   Physical Exam  ED Course/Procedures     Procedures  MDM  Received patient in signout.  Fatigue.  More shortness of breath and cough.  Diagnosed with COVID 2 weeks ago.  Recently seen by oncology.  Previous prostate and rectal cancer.  Reportedly had negative CT scan in March for restaging was negative.  However CT PE does show 2 small PEs but unfortunate does show some retroperitoneal abnormalities that are potentially metastatic disease versus lymphoma.  There is more swelling on the left leg compared to the right.  I think with patient doing worse overall.  Not eating and drinking.  Losing weight and pulmonary embolism in the setting of COVID and potentially malignancy I feels the patient would benefit from mission to the hospital for further work-up.  Will need reimaging of the abdomen pelvis.  Also likely imaging of the left lower extremity for the edema.  Will discuss with hospitalist       Davonna Belling, MD 11/22/20 [phone]

## 2020-11-23 ENCOUNTER — Telehealth: Payer: Self-pay

## 2020-11-23 NOTE — Telephone Encounter (Signed)
-----   Message from Ladell Pier, MD sent at 11/18/2020  1:25 PM EDT ----- Please call patient, calcium level is mildly elevated, repeat CMP in 2 weeks, add vitamin B12 and myeloma panel to labs from today

## 2020-11-23 NOTE — Telephone Encounter (Signed)
Called message left concerning most recent lab results on 11/18/2020  also encouraged to call for questions concerns or changes

## 2020-11-24 ENCOUNTER — Encounter: Payer: Self-pay | Admitting: *Deleted

## 2020-11-24 NOTE — Progress Notes (Signed)
Faxed labs of 11/18/20 to patient's home as he had requested.

## 2020-11-26 ENCOUNTER — Other Ambulatory Visit: Payer: Self-pay

## 2020-11-26 ENCOUNTER — Encounter (HOSPITAL_BASED_OUTPATIENT_CLINIC_OR_DEPARTMENT_OTHER): Payer: Self-pay | Admitting: Radiology

## 2020-11-26 ENCOUNTER — Inpatient Hospital Stay: Payer: Medicare Other

## 2020-11-26 ENCOUNTER — Ambulatory Visit (HOSPITAL_BASED_OUTPATIENT_CLINIC_OR_DEPARTMENT_OTHER)
Admission: RE | Admit: 2020-11-26 | Discharge: 2020-11-26 | Disposition: A | Discharge status: Home / Self Care | Payer: Medicare Other | Source: Ambulatory Visit | Attending: Oncology | Admitting: Oncology

## 2020-11-26 ENCOUNTER — Inpatient Hospital Stay (HOSPITAL_BASED_OUTPATIENT_CLINIC_OR_DEPARTMENT_OTHER): Payer: Medicare Other | Admitting: Oncology

## 2020-11-26 VITALS — BP 121/62 | HR 91 | Temp 98.4°F | Resp 18 | Ht 67.0 in | Wt 145.6 lb

## 2020-11-26 DIAGNOSIS — C20 Malignant neoplasm of rectum: Secondary | ICD-10-CM | POA: Diagnosis present

## 2020-11-26 LAB — BASIC METABOLIC PANEL - CANCER CENTER ONLY
Anion gap: 14 (ref 5–15)
BUN: 13 mg/dL (ref 8–23)
CO2: 23 mmol/L (ref 22–32)
Calcium: 10.4 mg/dL — ABNORMAL HIGH (ref 8.9–10.3)
Chloride: 97 mmol/L — ABNORMAL LOW (ref 98–111)
Creatinine: 0.58 mg/dL — ABNORMAL LOW (ref 0.61–1.24)
GFR, Estimated: >60 mL/min (ref >60)
Glucose, Bld: 67 mg/dL — ABNORMAL LOW (ref 70–99)
Potassium: 3.9 mmol/L (ref 3.5–5.1)
Sodium: 134 mmol/L — ABNORMAL LOW (ref 135–145)

## 2020-11-26 LAB — CORTISOL: Cortisol, Plasma: 8.7 ug/dL

## 2020-11-26 MED ORDER — IOHEXOL 300 MG/ML  SOLN
80.0000 mL | Freq: Once | INTRAMUSCULAR | Status: AC | PRN
  Administered 2020-11-26: 80 mL via INTRAVENOUS

## 2020-11-26 NOTE — Progress Notes (Signed)
[place] OFFICE PROGRESS NOTE   Diagnosis: Rectal cancer  INTERVAL HISTORY:   Cody Hurley was seen in the emergency room with shortness of breath and fatigue on 11/22/2020.  A CT of the chest revealed 2 foci of subsegmental pulmonary embolism in the left lung.  Scattered groundglass airspace opacity and interstitial opacity.  Changes of cirrhosis.  Bilateral adrenal nodules.  Extensive ill-defined soft tissue surrounding the right kidney within the adjacent perinephric fat.  He was placed on apixaban.  He is here today with his wife.  He continues to have anorexia and generalized weakness.  Objective:  Vital signs in last 24 hours:  Blood pressure 121/62, pulse 91, temperature 98.4 F (36.9 C), temperature source Oral, resp. rate 18, height 5\' 7"  (1.702 m), weight 145 lb 9.6 oz (66 kg), SpO2 97 %.     Resp: Lungs clear bilaterally Cardio: Regular rate and rhythm GI: No mass, no hepatosplenomegaly, left lower quadrant colostomy Vascular: Trace pitting edema at the left greater than right lower leg and foot  Lab Results:  Lab Results  Component Value Date   WBC 5.0 11/22/2020   HGB 13.5 11/22/2020   HCT 40.9 11/22/2020   MCV 104.6 (H) 11/22/2020   PLT 120 (L) 11/22/2020   NEUTROABS 3.1 11/22/2020    CMP  Lab Results  Component Value Date   NA 135 11/22/2020   K 4.1 11/22/2020   CL 97 (L) 11/22/2020   CO2 25 11/22/2020   GLUCOSE 69 (L) 11/22/2020   BUN 15 11/22/2020   CREATININE 0.63 11/22/2020   CALCIUM 10.1 11/22/2020   PROT 6.3 (L) 11/22/2020   ALBUMIN 2.8 (L) 11/22/2020   AST 62 (H) 11/22/2020   ALT 40 11/22/2020   ALKPHOS 89 11/22/2020   BILITOT 1.1 11/22/2020   GFRNONAA >60 11/22/2020    Lab Results  Component Value Date   CEA1 2.57 11/18/2020    No results found for: INR  Imaging:  CT Angio Chest PE W/Cm &/Or Wo Cm  Addendum Date: 11/22/2020   ADDENDUM REPORT: 11/22/2020 16:51 ADDENDUM: Addendum to correct original report:  Examination is positive for two foci of subsegmental embolus present in the lingula and left lower lobe. Findings were discussed by telephone with Dr. Alvino Chapel at 4:45 PM, 11/22/2020. Electronically Signed   By: Eddie Candle M.D.   On: 11/22/2020 16:51   Result Date: 11/22/2020 CLINICAL DATA:  Weakness, loss of appetite, cough, lower extremity swelling, concern for PE EXAM: CT ANGIOGRAPHY CHEST WITH CONTRAST TECHNIQUE: Multidetector CT imaging of the chest was performed using the standard protocol during bolus administration of intravenous contrast. Multiplanar CT image reconstructions and MIPs were obtained to evaluate the vascular anatomy. CONTRAST:  110mL OMNIPAQUE IOHEXOL 350 MG/ML SOLN COMPARISON:  04/10/2019 FINDINGS: Cardiovascular: Satisfactory opacification of the pulmonary arteries to the segmental level. No evidence of pulmonary embolism. Normal heart size. Three-vessel coronary artery calcifications and/or stents. No pericardial effusion. Aortic atherosclerosis. Mediastinum/Nodes: No enlarged mediastinal, hilar, or axillary lymph nodes. Small hiatal hernia. Thyroid gland, trachea, and esophagus demonstrate no significant findings. Lungs/Pleura: Moderate centrilobular emphysema. Scattered and dependent bilateral ground-glass airspace opacity. Irregular peripheral interstitial opacity interlobular septal thickening, most conspicuous at the bilateral lung bases. No pleural effusion or pneumothorax. Upper Abdomen: Coarse, nodular cirrhotic morphology of the liver. Trace perihepatic ascites. Numerous gallstones in gallbladder. There are new bilateral adrenal nodules, measuring 3.7 x 2.6 cm on the right (series 5, image 301) and 3.1 x 3.0 cm (series 5, image 347). There  is extensive, ill-defined soft tissue stranding about the right kidney and within the adjacent perinephric fat (series 5, image 285, 387). Musculoskeletal: No chest wall abnormality. No acute or significant osseous findings. Review of the MIP  images confirms the above findings. IMPRESSION: 1. Negative examination for pulmonary embolism. 2. Scattered and dependent bilateral ground-glass airspace opacity with irregular peripheral interstitial opacity and interlobular septal thickening, most conspicuous at the bilateral lung bases. Findings are nonspecific and infectious or inflammatory, including COVID-19 airspace disease if clinically suspected. 3. New bilateral adrenal nodules, concerning for metastatic disease. There is there is no intrathoracic evidence of malignancy. 4. There is extensive, ill-defined soft tissue stranding about the right kidney and within the adjacent perinephric fat. This is also highly suspicious for malignancy, perhaps lymphomatous involvement of the right kidney given appearance. 5. Recommend dedicated contrast enhanced imaging of the abdomen and pelvis to further evaluate above described abdominal findings. 6. Cholelithiasis. 7. Emphysema. 8. Coronary artery disease. Aortic Atherosclerosis (ICD10-I70.0) and Emphysema (ICD10-J43.9). Electronically Signed: By: Eddie Candle M.D. On: 11/22/2020 16:30   DG Chest Port 1 View  Result Date: 11/22/2020 CLINICAL DATA:  Pt's wife reports pt was diagnosed with Covid on 11/09/20. She reports he was been very weak, had loss of appetite, hacking cough, new BLE swelling, loss of 20lbs. Pt was given antibiotic and steroids when he was diagnosed with Covid EXAM: PORTABLE CHEST 1 VIEW COMPARISON:  01/18/2016 FINDINGS: Cardiac silhouette normal size.  No mediastinal or hilar masses. Prominent interstitial/bronchovascular markings are stable. No evidence of pneumonia or pulmonary edema. No pleural effusion or pneumothorax. Skeletal structures are grossly intact. IMPRESSION: No acute cardiopulmonary disease. Electronically Signed   By: Lajean Manes M.D.   On: 11/22/2020 14:26    Medications: I have reviewed the patient's current medications.   Assessment/Plan: Stage III rectal  cancer,ypT4a,N2 status post a low anterior resection in September 2017 Presenting November 2016 with a mass at 10 cm from the anus, clinical stage II by EUS-T3N0 Neoadjuvant Xeloda and radiation beginning 06/01/2015, completed 20 of 28 planned fractions, discontinued secondary to toxicity Elevated pretreatment CEA CT chest 04/10/2019-no evidence of metastatic disease, changes of interstitial lung disease, changes suggestive of cirrhosis CT chest 11/22/2020-scattered dependent groundglass airspace opacity and irregular interstitial opacity-nonspecific infectious or inflammatory, new bilateral adrenal nodules, extensive ill-defined soft tissue surrounding the right kidney suspicious for malignancy  Prostate cancer 1990 treated with a prostatectomy History of a TIA Carpal tunnel syndrome Palpable liver edge-ultrasound abdomen 04/10/2019-fatty infiltration of the liver, normal portal vein flow Left lung subsegmental pulmonary emboli 11/22/2020-apixaban Anorexia/weight loss COVID-19 infection June 2022      Disposition: Cody Hurley has a remote history of rectal cancer.  He has developed failure to thrive with anorexia/weight loss over the past several months.  He was recently diagnosed with COVID-19 infection.  He was seen in the emergency room on 11/22/2020.  A CT confirmed subsegmental pulmonary emboli in the left chest.  He is now taking apixaban.  The CT reveals evidence of metastatic disease involving the adrenal glands and perinephric fat.  I reviewed the CT images with Mr. Rainford and his wife.  He will be referred for a contrast CT abdomen/pelvis and return for an office visit next week.  We will check a cortisol level today.  The CEA returned normal when he was here on 11/18/2020.  Betsy Coder, MD  11/26/2020  11:39 AM

## 2020-12-01 ENCOUNTER — Inpatient Hospital Stay (HOSPITAL_COMMUNITY)
Admission: AD | Admit: 2020-12-01 | Discharge: 2020-12-19 | DRG: 840 | Disposition: A | Discharge status: Home / Self Care | Payer: Medicare Other | Source: Ambulatory Visit | Attending: Internal Medicine | Admitting: Internal Medicine

## 2020-12-01 ENCOUNTER — Inpatient Hospital Stay: Payer: Medicare Other | Attending: Oncology | Admitting: Oncology

## 2020-12-01 ENCOUNTER — Encounter (HOSPITAL_COMMUNITY): Payer: Self-pay | Admitting: Internal Medicine

## 2020-12-01 ENCOUNTER — Other Ambulatory Visit: Payer: Self-pay

## 2020-12-01 VITALS — BP 141/68 | HR 96 | Temp 98.0°F | Resp 20 | Ht 67.0 in | Wt 135.4 lb

## 2020-12-01 DIAGNOSIS — C8513 Unspecified B-cell lymphoma, intra-abdominal lymph nodes: Secondary | ICD-10-CM

## 2020-12-01 DIAGNOSIS — C8333 Diffuse large B-cell lymphoma, intra-abdominal lymph nodes: Secondary | ICD-10-CM

## 2020-12-01 DIAGNOSIS — U071 COVID-19: Secondary | ICD-10-CM | POA: Diagnosis not present

## 2020-12-01 DIAGNOSIS — Z86711 Personal history of pulmonary embolism: Secondary | ICD-10-CM

## 2020-12-01 DIAGNOSIS — Z8616 Personal history of COVID-19: Secondary | ICD-10-CM

## 2020-12-01 DIAGNOSIS — Z9049 Acquired absence of other specified parts of digestive tract: Secondary | ICD-10-CM

## 2020-12-01 DIAGNOSIS — R531 Weakness: Secondary | ICD-10-CM | POA: Diagnosis not present

## 2020-12-01 DIAGNOSIS — B370 Candidal stomatitis: Secondary | ICD-10-CM | POA: Diagnosis not present

## 2020-12-01 DIAGNOSIS — Z66 Do not resuscitate: Secondary | ICD-10-CM | POA: Diagnosis present

## 2020-12-01 DIAGNOSIS — C8580 Other specified types of non-Hodgkin lymphoma, unspecified site: Secondary | ICD-10-CM | POA: Diagnosis present

## 2020-12-01 DIAGNOSIS — K760 Fatty (change of) liver, not elsewhere classified: Secondary | ICD-10-CM | POA: Diagnosis present

## 2020-12-01 DIAGNOSIS — Z79899 Other long term (current) drug therapy: Secondary | ICD-10-CM

## 2020-12-01 DIAGNOSIS — R1900 Intra-abdominal and pelvic swelling, mass and lump, unspecified site: Secondary | ICD-10-CM | POA: Diagnosis present

## 2020-12-01 DIAGNOSIS — T451X5A Adverse effect of antineoplastic and immunosuppressive drugs, initial encounter: Secondary | ICD-10-CM | POA: Diagnosis not present

## 2020-12-01 DIAGNOSIS — I2699 Other pulmonary embolism without acute cor pulmonale: Secondary | ICD-10-CM | POA: Diagnosis present

## 2020-12-01 DIAGNOSIS — R634 Abnormal weight loss: Secondary | ICD-10-CM | POA: Diagnosis present

## 2020-12-01 DIAGNOSIS — J690 Pneumonitis due to inhalation of food and vomit: Secondary | ICD-10-CM | POA: Diagnosis not present

## 2020-12-01 DIAGNOSIS — E872 Acidosis, unspecified: Secondary | ICD-10-CM

## 2020-12-01 DIAGNOSIS — L89151 Pressure ulcer of sacral region, stage 1: Secondary | ICD-10-CM | POA: Diagnosis present

## 2020-12-01 DIAGNOSIS — Z0189 Encounter for other specified special examinations: Secondary | ICD-10-CM | POA: Diagnosis not present

## 2020-12-01 DIAGNOSIS — Z8546 Personal history of malignant neoplasm of prostate: Secondary | ICD-10-CM

## 2020-12-01 DIAGNOSIS — J189 Pneumonia, unspecified organism: Secondary | ICD-10-CM | POA: Diagnosis not present

## 2020-12-01 DIAGNOSIS — Z85048 Personal history of other malignant neoplasm of rectum, rectosigmoid junction, and anus: Secondary | ICD-10-CM | POA: Diagnosis not present

## 2020-12-01 DIAGNOSIS — H9193 Unspecified hearing loss, bilateral: Secondary | ICD-10-CM | POA: Diagnosis present

## 2020-12-01 DIAGNOSIS — R1909 Other intra-abdominal and pelvic swelling, mass and lump: Secondary | ICD-10-CM | POA: Diagnosis present

## 2020-12-01 DIAGNOSIS — Z933 Colostomy status: Secondary | ICD-10-CM

## 2020-12-01 DIAGNOSIS — C20 Malignant neoplasm of rectum: Secondary | ICD-10-CM

## 2020-12-01 DIAGNOSIS — M79672 Pain in left foot: Secondary | ICD-10-CM | POA: Diagnosis not present

## 2020-12-01 DIAGNOSIS — Z6821 Body mass index (BMI) 21.0-21.9, adult: Secondary | ICD-10-CM

## 2020-12-01 DIAGNOSIS — Z8673 Personal history of transient ischemic attack (TIA), and cerebral infarction without residual deficits: Secondary | ICD-10-CM | POA: Diagnosis not present

## 2020-12-01 DIAGNOSIS — Z515 Encounter for palliative care: Secondary | ICD-10-CM

## 2020-12-01 DIAGNOSIS — E162 Hypoglycemia, unspecified: Secondary | ICD-10-CM | POA: Diagnosis present

## 2020-12-01 DIAGNOSIS — D6959 Other secondary thrombocytopenia: Secondary | ICD-10-CM | POA: Diagnosis present

## 2020-12-01 DIAGNOSIS — R627 Adult failure to thrive: Secondary | ICD-10-CM

## 2020-12-01 DIAGNOSIS — Z7189 Other specified counseling: Secondary | ICD-10-CM | POA: Diagnosis not present

## 2020-12-01 DIAGNOSIS — Z7901 Long term (current) use of anticoagulants: Secondary | ICD-10-CM

## 2020-12-01 DIAGNOSIS — Z9079 Acquired absence of other genital organ(s): Secondary | ICD-10-CM

## 2020-12-01 DIAGNOSIS — E278 Other specified disorders of adrenal gland: Secondary | ICD-10-CM | POA: Diagnosis present

## 2020-12-01 DIAGNOSIS — Z880 Allergy status to penicillin: Secondary | ICD-10-CM

## 2020-12-01 DIAGNOSIS — K8020 Calculus of gallbladder without cholecystitis without obstruction: Secondary | ICD-10-CM | POA: Diagnosis present

## 2020-12-01 DIAGNOSIS — R0602 Shortness of breath: Secondary | ICD-10-CM

## 2020-12-01 DIAGNOSIS — Z923 Personal history of irradiation: Secondary | ICD-10-CM

## 2020-12-01 DIAGNOSIS — L8990 Pressure ulcer of unspecified site, unspecified stage: Secondary | ICD-10-CM | POA: Insufficient documentation

## 2020-12-01 DIAGNOSIS — I2510 Atherosclerotic heart disease of native coronary artery without angina pectoris: Secondary | ICD-10-CM | POA: Diagnosis present

## 2020-12-01 DIAGNOSIS — L89156 Pressure-induced deep tissue damage of sacral region: Secondary | ICD-10-CM | POA: Diagnosis not present

## 2020-12-01 DIAGNOSIS — C8590 Non-Hodgkin lymphoma, unspecified, unspecified site: Secondary | ICD-10-CM

## 2020-12-01 DIAGNOSIS — Z85820 Personal history of malignant melanoma of skin: Secondary | ICD-10-CM

## 2020-12-01 LAB — CBC WITH DIFFERENTIAL/PLATELET
Abs Immature Granulocytes: 0.03 10*3/uL (ref 0.00–0.07)
Basophils Absolute: 0.1 10*3/uL (ref 0.0–0.1)
Basophils Relative: 1 %
Eosinophils Absolute: 0.2 10*3/uL (ref 0.0–0.5)
Eosinophils Relative: 3 %
HCT: 44.2 % (ref 39.0–52.0)
Hemoglobin: 14.8 g/dL (ref 13.0–17.0)
Immature Granulocytes: 1 %
Lymphocytes Relative: 16 %
Lymphs Abs: 0.8 10*3/uL (ref 0.7–4.0)
MCH: 34.2 pg — ABNORMAL HIGH (ref 26.0–34.0)
MCHC: 33.5 g/dL (ref 30.0–36.0)
MCV: 102.1 fL — ABNORMAL HIGH (ref 80.0–100.0)
Monocytes Absolute: 0.8 10*3/uL (ref 0.1–1.0)
Monocytes Relative: 17 %
Neutro Abs: 3 10*3/uL (ref 1.7–7.7)
Neutrophils Relative %: 62 %
Platelets: 192 10*3/uL (ref 150–400)
RBC: 4.33 MIL/uL (ref 4.22–5.81)
RDW: 15.9 % — ABNORMAL HIGH (ref 11.5–15.5)
WBC: 4.8 10*3/uL (ref 4.0–10.5)
nRBC: 0 % (ref 0.0–0.2)

## 2020-12-01 LAB — COMPREHENSIVE METABOLIC PANEL
ALT: 32 U/L (ref 0–44)
AST: 63 U/L — ABNORMAL HIGH (ref 15–41)
Albumin: 3.3 g/dL — ABNORMAL LOW (ref 3.5–5.0)
Alkaline Phosphatase: 87 U/L (ref 38–126)
Anion gap: 19 — ABNORMAL HIGH (ref 5–15)
BUN: 15 mg/dL (ref 8–23)
CO2: 18 mmol/L — ABNORMAL LOW (ref 22–32)
Calcium: 11.3 mg/dL — ABNORMAL HIGH (ref 8.9–10.3)
Chloride: 99 mmol/L (ref 98–111)
Creatinine, Ser: 0.59 mg/dL — ABNORMAL LOW (ref 0.61–1.24)
GFR, Estimated: >60 mL/min (ref >60)
Glucose, Bld: 75 mg/dL (ref 70–99)
Potassium: 3.9 mmol/L (ref 3.5–5.1)
Sodium: 136 mmol/L (ref 135–145)
Total Bilirubin: 1.2 mg/dL (ref 0.3–1.2)
Total Protein: 7.2 g/dL (ref 6.5–8.1)

## 2020-12-01 LAB — TYPE AND SCREEN
ABO/RH(D): A POS
Antibody Screen: NEGATIVE

## 2020-12-01 LAB — HEPARIN LEVEL (UNFRACTIONATED): Heparin Unfractionated: >1.1 IU/mL — ABNORMAL HIGH (ref 0.30–0.70)

## 2020-12-01 LAB — APTT: aPTT: 39 seconds — ABNORMAL HIGH (ref 24–36)

## 2020-12-01 MED ORDER — ACETAMINOPHEN 325 MG PO TABS
650.0000 mg | ORAL_TABLET | Freq: Four times a day (QID) | ORAL | Status: DC | PRN
  Administered 2020-12-11 – 2020-12-18 (×5): 650 mg via ORAL
  Filled 2020-12-01 (×5): qty 2

## 2020-12-01 MED ORDER — BIOTENE DRY MOUTH MT LIQD: 15.0000 mL | OROMUCOSAL | Status: DC | PRN

## 2020-12-01 MED ORDER — ACETAMINOPHEN 650 MG RE SUPP: 650.0000 mg | Freq: Four times a day (QID) | RECTAL | Status: DC | PRN

## 2020-12-01 MED ORDER — HEPARIN (PORCINE) 25000 UT/250ML-% IV SOLN
1000.0000 [IU]/h | INTRAVENOUS | Status: DC
  Administered 2020-12-01: 850 [IU]/h via INTRAVENOUS
  Filled 2020-12-01: qty 250

## 2020-12-01 NOTE — Progress Notes (Signed)
[place] for IV heparin Indication: Recent Hx PE  Allergies  Allergen Reactions   Penicillin G Rash    Patient Measurements:   Heparin Dosing Weight: TBW  Vital Signs: Temp: 97.7 F (36.5 C) (07/05 1522) Temp Source: Oral (07/05 1522) BP: 141/76 (07/05 1522) Pulse Rate: 98 (07/05 1522)  Labs: Recent Labs    12/01/20 1715  HGB 14.8  HCT 44.2  PLT 192  APTT 39*  HEPARINUNFRC >1.10*  CREATININE 0.59*    Estimated Creatinine Clearance: 50.1 mL/min (A) (by C-G formula based on SCr of 0.59 mg/dL (L)).   Medical History: Past Medical History:  Diagnosis Date   Colorectal cancer ([place]) 2016   Prostate cancer ([place])     Medications:  Medications Prior to Admission  Medication Sig Dispense Refill Last Dose   acetaminophen (TYLENOL) 500 MG tablet Take 500-1,000 mg by mouth every 6 (six) hours as needed for mild pain (or headaches).   unk   APIXABAN (ELIQUIS) VTE STARTER PACK (10MG  AND 5MG ) Take as directed on package: start with two-5mg  tablets twice daily for 7 days. On day 8, switch to one-5mg  tablet twice daily. (Patient taking differently: Take 5-10 mg by mouth See admin instructions. Take 10 mg by mouth two times a day for 7 days, then decrease to 5 mg two times a day on "day 8") 1 each 0 12/01/2020 at 0730   Cholecalciferol (VITAMIN D-3) 25 MCG (1000 UT) CAPS Take 2,000 Units by mouth daily with breakfast.   12/01/2020 at am   Multiple Vitamin (MULTIVITAMIN) tablet Take 1 tablet by mouth daily.   12/01/2020   oxybutynin (DITROPAN) 5 MG tablet Take 5 mg by mouth daily.   12/01/2020 at am   vitamin B-12 (CYANOCOBALAMIN) 100 MCG tablet Take 100 mcg by mouth daily.   12/01/2020   Scheduled:  Infusions:   heparin     Assessment: 59 yoM with PMH rectal cancer, recently started on Eliquis for PE 11/22/20, admitted directly from [facility] for inpatient workup for possible metastatic malignancy. Eliquis held for retroperitoneal/renal Bx, with Pharmacy  to dose heparin in its place.  Baseline aPTT slightly elevated; baseline heparin level markedly elevated as expected with recent DOAC Prior anticoagulation: Eliquis 10 mg bid, transitioned to 5 mg BID today 7/5 with last dose at 0730  Significant events:  Today, 12/01/2020: CBC: WNL SCr WNL & at baseline No bleeding or infusion issues per nursing  Goal of Therapy: Heparin level 0.3-0.7 units/ml Monitor platelets by anticoagulation protocol: Yes  Plan: Heparin 850 units/hr (14 units/kg/hr) IV infusion to start at [place] today; no bolus Check aPTT 8 hrs after start Daily CBC and heparin level; aPTT as needed while DOAC anti-Xa effects persist Monitor for signs of bleeding or thrombosis F/u for ability to resume [place], PharmD, BCPS [phone] 12/01/2020, 7:10 PM

## 2020-12-01 NOTE — H&P (Signed)
History and Physical    Cody Hurley FXT:[id number] DOB: 1926/01/01 DOA: 12/01/2020  PCP: [name], MD  Patient coming from: Home.  Chief Complaint: Weakness.  HPI: Cody Hurley is a 87 y.o. male with history of rectal cancer last treated on 2017-experiencing increasing weakness and poor appetite.  Patient was diagnosed with COVID infection about 2 weeks ago was treated with antiviral and p.o. prednisone.  Subsequent which patient also had come to the ER on June 26 with shortness of breath and at the time patient CT angiogram showed 2 small pulmonary embolism and was placed on apixaban.  The CT scan also showed retroperitoneal lesions concerning for metastatic cancer.  Patient had subsequently followed with Dr. Malachy Mood patient's oncologist who ordered a CT abdomen pelvis confirms multiple lesions and since patient has been progressively worsening and finding it difficult to ambulate has poor appetite lost at least 20 to 30 pounds in the last month.  Patient admitted for further work-up.  On exam patient appears generally weak otherwise nonfocal.  Has mild swelling of the legs.  ED Course: Patient is a direct admit.  Review of Systems: As per HPI, rest all negative.   Past Medical History:  Diagnosis Date   Colorectal cancer ([place]) 2016   Prostate cancer [facility])     Past Surgical History:  Procedure Laterality Date   COLOSTOMY     MELANOMA EXCISION     ear   [place]     reports that he has never smoked. He has never used smokeless tobacco. He reports that he does not drink alcohol and does not use drugs.  Allergies  Allergen Reactions   Penicillin G Rash    History reviewed. No pertinent family history.  Prior to Admission medications   Medication Sig Start Date End Date Taking? Authorizing Provider  acetaminophen (TYLENOL) 500 MG tablet Take 500-1,000 mg by mouth every 6 (six) hours as needed for mild pain (or headaches).   Yes [provider]   APIXABAN Arne [place]) VTE STARTER PACK (10MG  AND 5MG ) Take as directed on package: start with two-5mg  tablets twice daily for 7 days. On day 8, switch to one-5mg  tablet twice daily. Patient taking differently: Take 5-10 mg by mouth See admin instructions. Take 10 mg by mouth two times a day for 7 days, then decrease to 5 mg two times a day on "day 8" 11/22/20  Yes Davonna Belling, MD  Cholecalciferol (VITAMIN D-3) 25 MCG (1000 UT) CAPS Take 2,000 Units by mouth daily with breakfast.   Yes [provider]  Multiple Vitamin (MULTIVITAMIN) tablet Take 1 tablet by mouth daily.   Yes [provider]  oxybutynin (DITROPAN) 5 MG tablet Take 5 mg by mouth daily. 09/18/20  Yes [provider]  vitamin B-12 (CYANOCOBALAMIN) 100 MCG tablet Take 100 mcg by mouth daily. 09/18/20  Yes [provider]    Physical Exam: Constitutional: Moderately built and nourished. Vitals:   12/01/20 1522  BP: (!) 141/76  Pulse: 98  Temp: 97.7 F (36.5 C)  TempSrc: Oral  SpO2: 96%   Eyes: Anicteric no pallor. ENMT: No discharge from the ears eyes nose and mouth. Neck: No mass felt.  No neck rigidity. Respiratory: No rhonchi or crepitations. Cardiovascular: S1-S2 heard. Abdomen: Soft nontender bowel sounds present. Musculoskeletal: Mild edema of the lower extremities. Skin: No rash. Neurologic: Alert awake oriented to time place and person.  Moves all extremities. Psychiatric: Appears normal.  Normal affect.   Labs on Admission:  I have personally reviewed following labs and imaging studies  CBC: Recent Labs  Lab 12/01/20 1715  WBC 4.8  NEUTROABS 3.0  HGB 14.8  HCT 44.2  MCV 102.1*  PLT 675   Basic Metabolic Panel: Recent Labs  Lab 11/26/20 1203  NA 134*  K 3.9  CL 97*  CO2 23  GLUCOSE 67*  BUN 13  CREATININE 0.58*  CALCIUM 10.4*   GFR: Estimated Creatinine Clearance: 50.1 mL/min (A) (by C-G formula based on SCr of 0.58 mg/dL (L)). Liver Function Tests: No  results for input(s): AST, ALT, ALKPHOS, BILITOT, PROT, ALBUMIN in the last 168 hours. No results for input(s): LIPASE, AMYLASE in the last 168 hours. No results for input(s): AMMONIA in the last 168 hours. Coagulation Profile: No results for input(s): INR, PROTIME in the last 168 hours. Cardiac Enzymes: No results for input(s): CKTOTAL, CKMB, CKMBINDEX, TROPONINI in the last 168 hours. BNP (last 3 results) No results for input(s): PROBNP in the last 8760 hours. HbA1C: No results for input(s): HGBA1C in the last 72 hours. CBG: No results for input(s): GLUCAP in the last 168 hours. Lipid Profile: No results for input(s): CHOL, HDL, LDLCALC, TRIG, CHOLHDL, LDLDIRECT in the last 72 hours. Thyroid Function Tests: No results for input(s): TSH, T4TOTAL, FREET4, T3FREE, THYROIDAB in the last 72 hours. Anemia Panel: No results for input(s): VITAMINB12, FOLATE, FERRITIN, TIBC, IRON, RETICCTPCT in the last 72 hours. Urine analysis: No results found for: COLORURINE, APPEARANCEUR, LABSPEC, PHURINE, GLUCOSEU, HGBUR, BILIRUBINUR, KETONESUR, PROTEINUR, UROBILINOGEN, NITRITE, LEUKOCYTESUR Sepsis Labs: @LABRCNTIP (procalcitonin:4,lacticidven:4) )No results found for this or any previous visit (from the past 240 hour(s)).   Radiological Exams on Admission: No results found.    Assessment/Plan Active Problems:   Retroperitoneal mass    Retroperitoneal renal lesions concerning for metastatic cancer for which Dr. Learta Codding has planned to get IR for biopsy.  Patient apixaban is on hold and place patient on heparin which has been stopped prior to biopsy. Recent diagnosis of pulm embolism on apixaban which is on hold in place.  On heparin and is discontinued will have to hold heparin prior to biopsy. History of rectal cancer and prostate cancer being followed by [name]. Recent COVID infection.  Since patient has retroperitoneal mass and also has PE on anticoagulation will need inpatient status to  work-up further.   Since patient had recent COVID infection 2 weeks ago.  COVID was not done.   All labs are pending.   DVT prophylaxis: Heparin. Code Status: Full code. Family Communication: Patient's wife at bedside. Disposition Plan: Home. Consults called: Oncologist. Admission status: Inpatient.   Rise Patience MD [facility] Pager [phone].  If 7PM-7AM, please contact night-coverage [email or web address] Password [facility]  12/01/2020, 5:52 PM

## 2020-12-01 NOTE — Progress Notes (Signed)
[name]   Diagnosis: Rectal cancer  INTERVAL HISTORY:   Cody Hurley returns as scheduled.  He is here with his wife.  He complains of generalized weakness and anorexia.  No pain.  No bleeding.  He last took apixaban this morning.  Cody Hurley is having difficulty caring for him in the home.  He has become very debilitated.  Objective:  Vital signs in last 24 hours:  Blood pressure (!) 141/68, pulse 96, temperature 98 F (36.7 C), temperature source Oral, resp. rate 20, height 5\' 7"  (1.702 m), weight 135 lb 6.4 oz (61.4 kg), SpO2 100 %.    HEENT: Mild thrush over the tongue and right buccal mucosa Lymphatics: No cervical, supraclavicular, axillary, or inguinal nodes Resp: Lungs clear bilaterally Cardio: Regular rate and rhythm GI: No hepatosplenomegaly, left lower quadrant colostomy Vascular: Pitting edema at the lower leg and ankle on the left greater than right Neuro: Alert and oriented   Portacath/PICC-without erythema  Lab Results:  Lab Results  Component Value Date   WBC 5.0 11/22/2020   HGB 13.5 11/22/2020   HCT 40.9 11/22/2020   MCV 104.6 (H) 11/22/2020   PLT 120 (L) 11/22/2020   NEUTROABS 3.1 11/22/2020    CMP  Lab Results  Component Value Date   NA 134 (L) 11/26/2020   K 3.9 11/26/2020   CL 97 (L) 11/26/2020   CO2 23 11/26/2020   GLUCOSE 67 (L) 11/26/2020   BUN 13 11/26/2020   CREATININE 0.58 (L) 11/26/2020   CALCIUM 10.4 (H) 11/26/2020   PROT 6.3 (L) 11/22/2020   ALBUMIN 2.8 (L) 11/22/2020   AST 62 (H) 11/22/2020   ALT 40 11/22/2020   ALKPHOS 89 11/22/2020   BILITOT 1.1 11/22/2020   GFRNONAA >60 11/26/2020    Lab Results  Component Value Date   CEA1 2.57 11/18/2020     Medications: I have reviewed the patient's current medications.   Assessment/Plan: Stage III rectal cancer,ypT4a,N2 status post a low anterior resection in September 2017 Presenting November 2016 with a mass at 10 cm from the anus,  clinical stage II by EUS-T3N0 Neoadjuvant Xeloda and radiation beginning 06/01/2015, completed 20 of 28 planned fractions, discontinued secondary to toxicity Elevated pretreatment CEA CT chest 04/10/2019-no evidence of metastatic disease, changes of interstitial lung disease, changes suggestive of cirrhosis CT chest 11/22/2020-scattered dependent groundglass airspace opacity and irregular interstitial opacity-nonspecific infectious or inflammatory, new bilateral adrenal nodules, extensive ill-defined soft tissue surrounding the right kidney suspicious for malignancy CT abdomen/pelvis 11/26/2020-bilateral adrenal masses, multinodular masslike soft tissue density throughout the right perinephric space and renal sinus, new left periaortic and retroperitoneal adenopathy, cirrhosis  Prostate cancer 1990 treated with a prostatectomy History of a TIA Carpal tunnel syndrome Palpable liver edge-ultrasound abdomen 04/10/2019-fatty infiltration of the liver, normal portal vein flow Left lung subsegmental pulmonary emboli 11/22/2020-apixaban Anorexia/weight loss COVID-19 infection June 2022     Disposition: Cody Hurley has a history of rectal cancer dating to September 2017.  He has developed failure to thrive and anorexia/weight loss over the past several months.  A CT of the chest 11/22/2020 and a CT abdomen/pelvis 11/26/2020 revealed evidence of malignancy.  He has bilateral adrenal masses and soft tissue nodularity surrounding the right kidney.  I discussed the CT findings and reviewed the images with Cody Hurley and his wife.  We discussed the differential diagnosis.  He most likely has recurrent rectal cancer, but the CT findings could be related to lymphoma or another malignancy.  He has a very poor performance status at present.  His wife can no longer manage his care at home.  I recommend hospital admission for a diagnostic biopsy.  We can decide on treatment versus hospice care based on the biopsy  finding.  The cortisol level returned in the low normal range last week.  I recommend a repeat cortisol level during this admission to be sure he has not developed adrenal insufficiency.  I discussed CODE STATUS with Cody Hurley and his wife.  He has not previously f thought about resuscitation.  We will continue these discussions while he is in the hospital.  I discussed the case with the hospitalist service.  He has been accepted for admission to the medical service at [facility].  Recommendations: Admit to [facility] Interventional radiology consult for biopsy of right perinephric soft tissue Hold apixaban Bilateral leg Dopplers  Betsy Coder, MD  12/01/2020  11:52 AM

## 2020-12-02 ENCOUNTER — Telehealth: Payer: Self-pay | Admitting: Oncology

## 2020-12-02 DIAGNOSIS — R627 Adult failure to thrive: Secondary | ICD-10-CM

## 2020-12-02 DIAGNOSIS — Z85048 Personal history of other malignant neoplasm of rectum, rectosigmoid junction, and anus: Secondary | ICD-10-CM

## 2020-12-02 DIAGNOSIS — I2699 Other pulmonary embolism without acute cor pulmonale: Secondary | ICD-10-CM

## 2020-12-02 DIAGNOSIS — Z8546 Personal history of malignant neoplasm of prostate: Secondary | ICD-10-CM

## 2020-12-02 DIAGNOSIS — U071 COVID-19: Secondary | ICD-10-CM

## 2020-12-02 DIAGNOSIS — R1900 Intra-abdominal and pelvic swelling, mass and lump, unspecified site: Secondary | ICD-10-CM

## 2020-12-02 LAB — CORTISOL-AM, BLOOD: Cortisol - AM: 12.1 ug/dL (ref 6.7–22.6)

## 2020-12-02 LAB — CBC
HCT: 42 % (ref 39.0–52.0)
Hemoglobin: 14.2 g/dL (ref 13.0–17.0)
MCH: 34.6 pg — ABNORMAL HIGH (ref 26.0–34.0)
MCHC: 33.8 g/dL (ref 30.0–36.0)
MCV: 102.4 fL — ABNORMAL HIGH (ref 80.0–100.0)
Platelets: 182 10*3/uL (ref 150–400)
RBC: 4.1 MIL/uL — ABNORMAL LOW (ref 4.22–5.81)
RDW: 16 % — ABNORMAL HIGH (ref 11.5–15.5)
WBC: 5.6 10*3/uL (ref 4.0–10.5)
nRBC: 0 % (ref 0.0–0.2)

## 2020-12-02 LAB — ABO/RH: ABO/RH(D): A POS

## 2020-12-02 LAB — APTT
aPTT: 64 seconds — ABNORMAL HIGH (ref 24–36)
aPTT: 123 seconds — ABNORMAL HIGH (ref 24–36)
aPTT: 133 seconds — ABNORMAL HIGH (ref 24–36)

## 2020-12-02 LAB — PROTIME-INR
INR: 1.3 — ABNORMAL HIGH (ref 0.8–1.2)
Prothrombin Time: 16 seconds — ABNORMAL HIGH (ref 11.4–15.2)

## 2020-12-02 MED ORDER — LIP MEDEX EX OINT
TOPICAL_OINTMENT | CUTANEOUS | Status: DC | PRN
  Filled 2020-12-02: qty 7

## 2020-12-02 MED ORDER — HEPARIN (PORCINE) 25000 UT/250ML-% IV SOLN
900.0000 [IU]/h | INTRAVENOUS | Status: DC
  Filled 2020-12-02: qty 250

## 2020-12-02 MED ORDER — SODIUM CHLORIDE 0.9 % IV SOLN: INTRAVENOUS | Status: DC

## 2020-12-02 MED ORDER — HEPARIN (PORCINE) 25000 UT/250ML-% IV SOLN
750.0000 [IU]/h | INTRAVENOUS | Status: DC
  Filled 2020-12-02 (×2): qty 250

## 2020-12-02 NOTE — Progress Notes (Signed)
IP PROGRESS NOTE  Subjective:   Cody Hurley complains of a dry mouth.  His wife is at the bedside.  He remains very weak.  He is not capable of self-care.  Cody Hurley says she can not take care of him in his current condition.  Objective: Vital signs in last 24 hours: Blood pressure 115/61, pulse 96, temperature 97.9 F (36.6 C), temperature source Oral, resp. rate 16, SpO2 95 %.  Intake/Output from previous day: 07/05 0701 - 07/06 0700 In: 327.3 [P.O.:240; I.V.:87.3] Out: 400 [Urine:400]  Physical Exam:  HEENT: Dryness of the tongue and buccal mucosa Lungs: Clear anteriorly Cardiac: Regular rate and rhythm Abdomen: No hepatosplenomegaly, no mass, nontender, left lower quadrant colostomy Extremities: No leg edema Neurologic: Alert, follows commands, oriented   Lab Results: Recent Labs    12/01/20 1715 12/02/20 0247  WBC 4.8 5.6  HGB 14.8 14.2  HCT 44.2 42.0  PLT 192 182    BMET Recent Labs    12/01/20 1715  NA 136  K 3.9  CL 99  CO2 18*  GLUCOSE 75  BUN 15  CREATININE 0.59*  CALCIUM 11.3*    Lab Results  Component Value Date   CEA1 2.57 11/18/2020    Studies/Results: No results found.  Medications: I have reviewed the patient's current medications.  Assessment/Plan: Stage III rectal cancer,ypT4a,N2 status post a low anterior resection in September 2017 Presenting November 2016 with a mass at 10 cm from the anus, clinical stage II by [id number] Neoadjuvant Xeloda and radiation beginning 06/01/2015, completed 20 of 28 planned fractions, discontinued secondary to toxicity Elevated pretreatment CEA CT chest 04/10/2019-no evidence of metastatic disease, changes of interstitial lung disease, changes suggestive of cirrhosis CT chest 11/22/2020-scattered dependent groundglass airspace opacity and irregular interstitial opacity-nonspecific infectious or inflammatory, new bilateral adrenal nodules, extensive ill-defined soft tissue surrounding the right kidney  suspicious for malignancy CT abdomen/pelvis 11/26/2020-bilateral adrenal masses, multinodular masslike soft tissue density throughout the right perinephric space and renal sinus, new left periaortic and retroperitoneal adenopathy, cirrhosis   Prostate cancer 1990 treated with a prostatectomy History of a TIA Carpal tunnel syndrome Palpable liver edge-ultrasound abdomen 04/10/2019-fatty infiltration of the liver, normal portal vein flow Left lung subsegmental pulmonary emboli 11/22/2020-apixaban Anorexia/weight loss COVID-19 infection June 2022 Admission 12/01/2020 with failure to thrive Elevated calcium-likely hypercalcemia of malignancy     Cody Hurley appears unchanged.  He continues to have anorexia and weakness.  I suspect he has a progressive malignancy, most likely recurrent rectal cancer.  He is scheduled for a diagnostic biopsy tomorrow.  I will initiate a transition of care consult to begin discussions with his wife regarding placement.  The calcium was elevated today.  I will increase the intravenous hydration and repeat the calcium level tomorrow morning.  If still elevated we will prescribe biphosphonate therapy.  I discussed CODE STATUS with Cody Hurley and his wife again today.  He will remain on a full CODE STATUS for now.   LOS: 1 day   Betsy Coder, MD   12/02/2020, 6:59 AM

## 2020-12-02 NOTE — Progress Notes (Signed)
[place] for IV heparin (PTA apixaban) Indication: Recent Hx PE  Allergies  Allergen Reactions   Penicillin G Rash    Patient Measurements:   Heparin Dosing Weight: TBW  Vital Signs: Temp: 97.9 F (36.6 C) (07/06 0355) Temp Source: Oral (07/06 0355) BP: 115/61 (07/06 0355) Pulse Rate: 96 (07/06 0355)  Labs: Recent Labs    12/01/20 1715 12/02/20 0247 12/02/20 1044  HGB 14.8 14.2  --   HCT 44.2 42.0  --   PLT 192 182  --   APTT 39* 64* 123*  LABPROT  --   --  16.0*  INR  --   --  1.3*  HEPARINUNFRC >1.10*  --   --   CREATININE 0.59*  --   --      Estimated Creatinine Clearance: 50.1 mL/min (A) (by C-G formula based on SCr of 0.59 mg/dL (L)).   Medical History: Past Medical History:  Diagnosis Date   Colorectal cancer ([place]) 2016   Prostate cancer ([place])     Medications:  Medications Prior to Admission  Medication Sig Dispense Refill Last Dose   acetaminophen (TYLENOL) 500 MG tablet Take 500-1,000 mg by mouth every 6 (six) hours as needed for mild pain (or headaches).   unk   APIXABAN (ELIQUIS) VTE STARTER PACK (10MG  AND 5MG ) Take as directed on package: start with two-5mg  tablets twice daily for 7 days. On day 8, switch to one-5mg  tablet twice daily. (Patient taking differently: Take 5-10 mg by mouth See admin instructions. Take 10 mg by mouth two times a day for 7 days, then decrease to 5 mg two times a day on "day 8") 1 each 0 12/01/2020 at 0730   Cholecalciferol (VITAMIN D-3) 25 MCG (1000 UT) CAPS Take 2,000 Units by mouth daily with breakfast.   12/01/2020 at am   Multiple Vitamin (MULTIVITAMIN) tablet Take 1 tablet by mouth daily.   12/01/2020   oxybutynin (DITROPAN) 5 MG tablet Take 5 mg by mouth daily.   12/01/2020 at am   vitamin B-12 (CYANOCOBALAMIN) 100 MCG tablet Take 100 mcg by mouth daily.   12/01/2020   Scheduled:  Infusions:   sodium chloride 75 mL/hr at 12/02/20 0953   heparin 1,000 Units/hr (12/02/20 0341)    Assessment: 70 yoM with PMH rectal cancer, recently started on Eliquis for PE 11/22/20, admitted directly from [facility] for inpatient workup for possible metastatic malignancy. Eliquis held for retroperitoneal/renal Bx, with Pharmacy to dose heparin in its place.  Baseline aPTT slightly elevated; baseline heparin level markedly elevated as expected with recent DOAC Prior anticoagulation: Eliquis 10 mg bid, transitioned to 5 mg BID today 7/5 with last dose at 0730  Significant events:  Today, 12/02/2020: aPTT now SUPRAtherapeutic on current IV heparin rate of 1000 units/hr CBC: WNL Per RN, no bleeding or issues to report  Goal of Therapy: aPTT 66-102 sec Heparin level 0.3-0.7 units/ml Monitor platelets by anticoagulation protocol: Yes  Plan: Decrease Heparin from 1000 units/hr to 900 units/hr Recheck aPTT 8 hrs after heparin rate decrease  Daily CBC and heparin level; aPTT as needed while DOAC anti-Xa effects persist Monitor for signs of bleeding or thrombosis F/u for ability to resume Eliquis Biopsy tentatively scheduled for 7/7 - f/up need to hold heparin prior to procedure and when to restart post biopsy    Adrian Saran, PharmD, BCPS Secure Chat if ?s 12/02/2020 12:01 PM

## 2020-12-02 NOTE — Progress Notes (Addendum)
PROGRESS NOTE    Cody Hurley  UTM:[id number] DOB: 05/18/1926 DOA: 12/01/2020 PCP: Cody Sos, MD    No chief complaint on file.   Brief Narrative:  HPI per Dr. Doylene Hurley is a 87 y.o. male with history of rectal cancer last treated on 2017-experiencing increasing weakness and poor appetite.  Patient was diagnosed with COVID infection about 2 weeks ago was treated with antiviral and p.o. prednisone.  Subsequent which patient also had come to the ER on June 26 with shortness of breath and at the time patient CT angiogram showed 2 small pulmonary embolism and was placed on apixaban.  The CT scan also showed retroperitoneal lesions concerning for metastatic cancer.  Patient had subsequently followed with Dr. Benay Hurley patient's oncologist who ordered a CT abdomen pelvis confirms multiple lesions and since patient has been progressively worsening and finding it difficult to ambulate has poor appetite lost at least 20 to 30 pounds in the last month.  Patient admitted for further work-up.  On exam patient appears generally weak otherwise nonfocal.  Has mild swelling of the legs.     Assessment & Plan:   Principal Problem:   Retroperitoneal mass Active Problems:   Pulmonary embolism (HCC)   FTT (failure to thrive) in adult   Hypercalcemia   History of rectal cancer   History of prostate cancer   COVID-19 virus infection: Recent COVID-19 infection status posttreatment  #1 retroperitoneal renal lesions concerning for metastatic cancer -Patient seen in consultation by oncology who have consulted with IR for evaluation for biopsy hopefully to be done in the next 24 hours. -Continue to hold apixaban and patient currently on heparin. -Per oncology, IR.  2.  Recent diagnosis of PE -Apixaban on hold in anticipation of biopsy. -Continue heparin.  3.  History of rectal cancer and prostate cancer -Being followed by oncology, Dr. Benay Hurley.  4.  Hypercalcemia -Likely  hypercalcemia of malignancy. -Ionized calcium ordered per oncology. -Check a magnesium level, phosphorus level. -Patient started on IV fluids. -If no improvement with hypercalcemia may need a trial of bisphosphonates.  5.  Recent COVID-19 infection -Status posttreatment with antiviral and steroids. -Patient with sats of 96% on room air.  6.  Failure to thrive -Likely secondary to #1, #3. -Biopsies pending. -Check a random cortisol level in the a.m. -May need palliative care involvement once biopsies are finalized and if patient felt not a candidate for any further chemotherapy. -We will defer timing of palliative care involvement to oncology.   DVT prophylaxis: Heparin Code Status: Full Family Communication: Updated patient, wife, daughter at bedside. Disposition:   Status is: Inpatient  Remains inpatient appropriate because:Inpatient level of care appropriate due to severity of illness  Dispo: The patient is from: Home              Anticipated d/c is to: Home              Patient currently is not medically stable to d/c.   Difficult to place patient No       Consultants:  Oncology: Dr. Benay Hurley IR: Dr. Laurence Hurley 12/02/2020  Procedures:  None  Antimicrobials:  None   Subjective: Sitting up in chair.  Denies any chest pain.  No shortness of breath.  Family at bedside.  Family feels patient more alert and sitting up today.  Objective: Vitals:   12/01/20 2323 12/02/20 0355 12/02/20 1507 12/02/20 2110  BP: (!) 118/55 115/61 (!) 127/57 (!) 123/57  Pulse: (!) 102 96 91 90  Resp: 16 16 16 16   Temp: 97.6 F (36.4 C) 97.9 F (36.6 C) 97.9 F (36.6 C) 97.8 F (36.6 C)  TempSrc: Oral Oral Oral Oral  SpO2: 95% 95% 95% 96%    Intake/Output Summary (Last 24 hours) at 12/02/2020 2122 Last data filed at 12/02/2020 1645 Gross per 24 hour  Intake 831 ml  Output 950 ml  Net -119 ml   There were no vitals filed for this visit.  Examination:  General exam: Appears  calm and comfortable  Respiratory system: Clear to auscultation. Respiratory effort normal. Cardiovascular system: S1 & S2 heard, RRR. No JVD, murmurs, rubs, gallops or clicks. No pedal edema. Gastrointestinal system: Abdomen is nondistended, soft and nontender. No organomegaly or masses felt. Normal bowel sounds heard. Central nervous system: Alert and oriented. No focal neurological deficits. Extremities: Symmetric 5 x 5 power. Skin: No rashes, lesions or ulcers Psychiatry: Judgement and insight appear normal. Mood & affect appropriate.     Data Reviewed: I have personally reviewed following labs and imaging studies  CBC: Recent Labs  Lab 12/01/20 1715 12/02/20 0247  WBC 4.8 5.6  NEUTROABS 3.0  --   HGB 14.8 14.2  HCT 44.2 42.0  MCV 102.1* 102.4*  PLT 192 630    Basic Metabolic Panel: Recent Labs  Lab 11/26/20 1203 12/01/20 1715  NA 134* 136  K 3.9 3.9  CL 97* 99  CO2 23 18*  GLUCOSE 67* 75  BUN 13 15  CREATININE 0.58* 0.59*  CALCIUM 10.4* 11.3*    GFR: Estimated Creatinine Clearance: 50.1 mL/min (A) (by C-G formula based on SCr of 0.59 mg/dL (L)).  Liver Function Tests: Recent Labs  Lab 12/01/20 1715  AST 63*  ALT 32  ALKPHOS 87  BILITOT 1.2  PROT 7.2  ALBUMIN 3.3*    CBG: No results for input(s): GLUCAP in the last 168 hours.   No results found for this or any previous visit (from the past 240 hour(s)).       Radiology Studies: No results found.      Scheduled Meds: Continuous Infusions:  sodium chloride 125 mL/hr at 12/02/20 1359   heparin       LOS: 1 day    Time spent: 35 minutes    Cody Seal, MD [facility]   To contact the attending provider between 7A-7P or the covering provider during after hours 7P-7A, please log into the web site [email or web address] and access using universal  password for that web site. If you do not have the password, please call the hospital operator.  12/02/2020, 9:22 PM

## 2020-12-02 NOTE — Progress Notes (Signed)
[place] for IV heparin Indication: Recent Hx PE  Allergies  Allergen Reactions   Penicillin G Rash    Patient Measurements:   Heparin Dosing Weight: TBW  Vital Signs: Temp: 97.6 F (36.4 C) (07/05 2323) Temp Source: Oral (07/05 2323) BP: 118/55 (07/05 2323) Pulse Rate: 102 (07/05 2323)  Labs: Recent Labs    12/01/20 1715 12/02/20 0247  HGB 14.8 14.2  HCT 44.2 42.0  PLT 192 182  APTT 39* 64*  HEPARINUNFRC >1.10*  --   CREATININE 0.59*  --      Estimated Creatinine Clearance: 50.1 mL/min (A) (by C-G formula based on SCr of 0.59 mg/dL (L)).   Medical History: Past Medical History:  Diagnosis Date   Colorectal cancer ([place]) 2016   Prostate cancer ([place])     Medications:  Medications Prior to Admission  Medication Sig Dispense Refill Last Dose   acetaminophen (TYLENOL) 500 MG tablet Take 500-1,000 mg by mouth every 6 (six) hours as needed for mild pain (or headaches).   unk   APIXABAN (ELIQUIS) VTE STARTER PACK (10MG  AND 5MG ) Take as directed on package: start with two-5mg  tablets twice daily for 7 days. On day 8, switch to one-5mg  tablet twice daily. (Patient taking differently: Take 5-10 mg by mouth See admin instructions. Take 10 mg by mouth two times a day for 7 days, then decrease to 5 mg two times a day on "day 8") 1 each 0 12/01/2020 at 0730   Cholecalciferol (VITAMIN D-3) 25 MCG (1000 UT) CAPS Take 2,000 Units by mouth daily with breakfast.   12/01/2020 at am   Multiple Vitamin (MULTIVITAMIN) tablet Take 1 tablet by mouth daily.   12/01/2020   oxybutynin (DITROPAN) 5 MG tablet Take 5 mg by mouth daily.   12/01/2020 at am   vitamin B-12 (CYANOCOBALAMIN) 100 MCG tablet Take 100 mcg by mouth daily.   12/01/2020   Scheduled:  Infusions:   heparin 850 Units/hr (12/01/20 2006)   Assessment: 38 yoM with PMH rectal cancer, recently started on Eliquis for PE 11/22/20, admitted directly from [facility] for inpatient workup for possible  metastatic malignancy. Eliquis held for retroperitoneal/renal Bx, with Pharmacy to dose heparin in its place.  Baseline aPTT slightly elevated; baseline heparin level markedly elevated as expected with recent DOAC Prior anticoagulation: Eliquis 10 mg bid, transitioned to 5 mg BID today 7/5 with last dose at 0730  Significant events:  Today, 12/02/2020: aPTT = 64 sec (subtherapeutic) with heparin gtt @ 850 units/hr CBC: WNL No bleeding or infusion issuesreported by nursing  Goal of Therapy: aPTT 66-102 sec Heparin level 0.3-0.7 units/ml Monitor platelets by anticoagulation protocol: Yes  Plan: Increase Heparin to 1000 units/hr  Check aPTT 8 hrs after heparin rate increase  Daily CBC and heparin level; aPTT as needed while DOAC anti-Xa effects persist Monitor for signs of bleeding or thrombosis F/u for ability to resume Eliquis  Leone Haven, PharmD 12/02/2020, 3:40 AM

## 2020-12-02 NOTE — Telephone Encounter (Signed)
Per 7/5 los  : Follow-up, to be arranged

## 2020-12-02 NOTE — Progress Notes (Signed)
[place] for IV heparin (PTA apixaban) Indication: Recent Hx PE  Allergies  Allergen Reactions   Penicillin G Rash    Patient Measurements:   Heparin Dosing Weight: TBW  Vital Signs: Temp: 97.9 F (36.6 C) (07/06 1507) Temp Source: Oral (07/06 1507) BP: 127/57 (07/06 1507) Pulse Rate: 91 (07/06 1507)  Labs: Recent Labs    12/01/20 1715 12/02/20 0247 12/02/20 1044 12/02/20 2023  HGB 14.8 14.2  --   --   HCT 44.2 42.0  --   --   PLT 192 182  --   --   APTT 39* 64* 123* 133*  LABPROT  --   --  16.0*  --   INR  --   --  1.3*  --   HEPARINUNFRC >1.10*  --   --   --   CREATININE 0.59*  --   --   --      Estimated Creatinine Clearance: 50.1 mL/min (A) (by C-G formula based on SCr of 0.59 mg/dL (L)).   Medical History: Past Medical History:  Diagnosis Date   Colorectal cancer ([place]) 2016   Prostate cancer ([place])     Medications:  Medications Prior to Admission  Medication Sig Dispense Refill Last Dose   acetaminophen (TYLENOL) 500 MG tablet Take 500-1,000 mg by mouth every 6 (six) hours as needed for mild pain (or headaches).   unk   APIXABAN (ELIQUIS) VTE STARTER PACK (10MG  AND 5MG ) Take as directed on package: start with two-5mg  tablets twice daily for 7 days. On day 8, switch to one-5mg  tablet twice daily. (Patient taking differently: Take 5-10 mg by mouth See admin instructions. Take 10 mg by mouth two times a day for 7 days, then decrease to 5 mg two times a day on "day 8") 1 each 0 12/01/2020 at 0730   Cholecalciferol (VITAMIN D-3) 25 MCG (1000 UT) CAPS Take 2,000 Units by mouth daily with breakfast.   12/01/2020 at am   Multiple Vitamin (MULTIVITAMIN) tablet Take 1 tablet by mouth daily.   12/01/2020   oxybutynin (DITROPAN) 5 MG tablet Take 5 mg by mouth daily.   12/01/2020 at am   vitamin B-12 (CYANOCOBALAMIN) 100 MCG tablet Take 100 mcg by mouth daily.   12/01/2020   Scheduled:  Infusions:   sodium chloride 125 mL/hr at 12/02/20  1359   heparin 900 Units/hr (12/02/20 1218)   Assessment: 70 yoM with PMH rectal cancer, recently started on Eliquis for PE 11/22/20, admitted directly from [facility] for inpatient workup for possible metastatic malignancy. Eliquis held for retroperitoneal/renal Bx, with Pharmacy to dose heparin in its place.  Baseline aPTT slightly elevated; baseline heparin level markedly elevated as expected with recent DOAC Prior anticoagulation: Eliquis 10 mg bid, transitioned to 5 mg BID today 7/5 with last dose at 0730  Significant events:  Today, 12/02/2020: aPTT remains SUPRAtherapeutic despite heparin rate decrease to 900 units/hr Per RN, no bleeding or issues to report; confirmed that lab was drawn from opposite arm that heparin is infusing.  Goal of Therapy: aPTT 66-102 sec Heparin level 0.3-0.7 units/ml Monitor platelets by anticoagulation protocol: Yes  Plan: Hold heparin infusion x 1 hour When resumes, decrease Heparin to 750 units/hr Recheck aPTT in 8 hrs  Daily CBC and heparin level; aPTT as needed while DOAC anti-Xa effects persist Monitor for signs of bleeding or thrombosis F/u for ability to resume Eliquis Biopsy tentatively scheduled for 7/7 - f/up need to hold heparin prior to procedure and  when to restart post biopsy   Peggyann Juba, PharmD, BCPS Pharmacy: [phone] 12/02/2020 9:08 PM

## 2020-12-02 NOTE — Consult Note (Signed)
Chief Complaint: Patient was seen in consultation today for CT-guided biopsy of right perirenal mass  Referring Physician(s): Sherrill,B  Supervising Physician: Sandi Mariscal  Patient Status: [facility] - In-pt  History of Present Illness: Cody Hurley is a 87 y.o. 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  He now presents to [facility] with failure to thrive, anorexia/weight loss, dyspnea, heel pain.  Recent imaging has revealed :   . Scattered and dependent bilateral ground-glass airspace opacity with irregular peripheral interstitial opacity and interlobular septal thickening, most conspicuous at the bilateral lung bases. Findings are nonspecific and infectious or inflammatory, including COVID-19 airspace disease if clinically suspected.  New bilateral adrenal nodules, concerning for metastatic disease. There is there is no intrathoracic evidence of malignancy.  There is extensive, ill-defined soft tissue stranding about the right kidney and within the adjacent perinephric fat. This is also highly suspicious for malignancy, perhaps lymphomatous involvement of the right kidney given appearance.  Recommend dedicated contrast enhanced imaging of the abdomen and pelvis to further evaluate above described abdominal findings.  Cholelithiasis.  Emphysema. . Coronary artery disease  Current labs include PT 16, INR 1.3, WBC 5.6, hemoglobin 14.2, platelets 182k, creatinine 0.59; request now received from oncology for CT-guided right perirenal mass biopsy for further evaluation.  Past Medical History:  Diagnosis  Date   Colorectal cancer ([place]) 2016   Prostate cancer [facility])     Past Surgical History:  Procedure Laterality Date   COLOSTOMY     MELANOMA EXCISION     ear   PROSTATE SURGERY  1991    Allergies: Penicillin g  Medications: Prior to Admission medications   Medication Sig Start Date End Date Taking? Authorizing Provider  acetaminophen (TYLENOL) 500 MG tablet Take 500-1,000 mg by mouth every 6 (six) hours as needed for mild pain (or headaches).   Yes [provider]  APIXABAN Arne [place]) VTE STARTER PACK (10MG  AND 5MG ) Take as directed on package: start with two-5mg  tablets twice daily for 7 days. On day 8, switch to one-5mg  tablet twice daily. Patient taking differently: Take 5-10 mg by mouth See admin instructions. Take 10 mg by mouth two times a day for 7 days, then decrease to 5 mg two times a day on "day 8" 11/22/20  Yes Davonna Belling, MD  Cholecalciferol (VITAMIN D-3) 25 MCG (1000 UT) CAPS Take 2,000 Units by mouth daily with breakfast.   Yes [provider]  Multiple Vitamin (MULTIVITAMIN) tablet Take 1 tablet by mouth daily.   Yes [provider]  oxybutynin (DITROPAN) 5 MG tablet Take 5 mg by mouth daily. 09/18/20  Yes [provider]  vitamin B-12 (CYANOCOBALAMIN) 100 MCG tablet Take 100 mcg by mouth daily. 09/18/20  Yes [provider]     History reviewed. No pertinent family history.  Social History   Socioeconomic History   Marital status: Married    Spouse name: Not on file   Number of children: Not on file   Years of education: Not on file   Highest education level: Not on file  Occupational History   Not on file  Tobacco Use   Smoking status: Never   Smokeless tobacco:  Never  Vaping Use   Vaping Use: Never used  Substance and Sexual Activity   Alcohol use: Never   Drug use: Never   Sexual activity: Not on file  Other Topics Concern   Not on file  Social History Narrative   Not on file   Social Determinants  of Health   Financial Resource Strain: Not on file  Food Insecurity: Not on file  Transportation Needs: Not on file  Physical Activity: Not on file  Stress: Not on file  Social Connections: Not on file      Review of Systems see above; denies fever, headache, chest pain, worsening cough, abdominal/back pain, nausea, vomiting or bleeding; he is hard of hearing  Vital Signs: BP 115/61 (BP Location: Left Arm)   Pulse 96   Temp 97.9 F (36.6 C) (Oral)   Resp 16   SpO2 95%   Physical Exam awake, answering simple questions okay, very hard of hearing; chest with distant breath sounds bilaterally.  Heart with regular rate /rhythm, positive murmur.  Abdomen soft, left lower quadrant colostomy intact, currently nontender.  Pitting edema bilateral lower extremities, left greater than right.  Imaging: CT Angio Chest PE W/Cm &/Or Wo Cm  Addendum Date: 11/22/2020   ADDENDUM REPORT: 11/22/2020 16:51 ADDENDUM: Addendum to correct original report: Examination is positive for two foci of subsegmental embolus present in the lingula and left lower lobe. Findings were discussed by telephone with Dr. Alvino Chapel at 4:45 PM, 11/22/2020. Electronically Signed   By: Eddie Candle M.D.   On: 11/22/2020 16:51   Result Date: 11/22/2020 CLINICAL DATA:  Weakness, loss of appetite, cough, lower extremity swelling, concern for PE EXAM: CT ANGIOGRAPHY CHEST WITH CONTRAST TECHNIQUE: Multidetector CT imaging of the chest was performed using the standard protocol during bolus administration of intravenous contrast. Multiplanar CT image reconstructions and MIPs were obtained to evaluate the vascular anatomy. CONTRAST:  149mL OMNIPAQUE IOHEXOL 350 MG/ML SOLN COMPARISON:  04/10/2019 FINDINGS: Cardiovascular: Satisfactory opacification of the pulmonary arteries to the segmental level. No evidence of pulmonary embolism. Normal heart size. Three-vessel coronary artery calcifications and/or stents. No pericardial effusion. Aortic  atherosclerosis. Mediastinum/Nodes: No enlarged mediastinal, hilar, or axillary lymph nodes. Small hiatal hernia. Thyroid gland, trachea, and esophagus demonstrate no significant findings. Lungs/Pleura: Moderate centrilobular emphysema. Scattered and dependent bilateral ground-glass airspace opacity. Irregular peripheral interstitial opacity interlobular septal thickening, most conspicuous at the bilateral lung bases. No pleural effusion or pneumothorax. Upper Abdomen: Coarse, nodular cirrhotic morphology of the liver. Trace perihepatic ascites. Numerous gallstones in gallbladder. There are new bilateral adrenal nodules, measuring 3.7 x 2.6 cm on the right (series 5, image 301) and 3.1 x 3.0 cm (series 5, image 347). There is extensive, ill-defined soft tissue stranding about the right kidney and within the adjacent perinephric fat (series 5, image 285, 387). Musculoskeletal: No chest wall abnormality. No acute or significant osseous findings. Review of the MIP images confirms the above findings. IMPRESSION: 1. Negative examination for pulmonary embolism. 2. Scattered and dependent bilateral ground-glass airspace opacity with irregular peripheral interstitial opacity and interlobular septal thickening, most conspicuous at the bilateral lung bases. Findings are nonspecific and infectious or inflammatory, including COVID-19 airspace disease if clinically suspected. 3. New bilateral adrenal nodules, concerning for metastatic disease. There is there is no intrathoracic evidence of malignancy. 4. There is extensive, ill-defined soft tissue stranding about the right kidney and within the adjacent perinephric fat. This is also highly suspicious for malignancy, perhaps lymphomatous involvement of the right kidney given appearance.  5. Recommend dedicated contrast enhanced imaging of the abdomen and pelvis to further evaluate above described abdominal findings. 6. Cholelithiasis. 7. Emphysema. 8. Coronary artery disease.  Aortic Atherosclerosis (ICD10-I70.0) and Emphysema (ICD10-J43.9). Electronically Signed: By: Eddie Candle M.D. On: 11/22/2020 16:30   CT ABDOMEN PELVIS W CONTRAST  Result Date: 11/27/2020 CLINICAL DATA:  Primary Cancer Type: Rectal Imaging Indication: Restaging for new clinical signs or symptoms Interval therapy since last imaging? No Initial Cancer Diagnosis Date: 05/15/2015; established by: Biopsy-proven Detailed Pathology: Stage III rectal cancer. Moderate to poorly differentiated adenocarcinoma. Primary Tumor location: Mass at 10 cm from the anus. New metastatic disease involving the adrenal glands and perinephric fat. Surgeries: Low anterior resection 01/2016. Descending colostomy and Hartmann's pouch. Chemotherapy: Yes; Ongoing?  No; Most recent administration: 2018 Immunotherapy? No Radiation therapy?  Yes; Date Range: 2017 Other Cancer Therapies: Prostate cancer; prostatectomy 1990. EXAM: CT ABDOMEN AND PELVIS WITH CONTRAST TECHNIQUE: Multidetector CT imaging of the abdomen and pelvis was performed using the standard protocol following bolus administration of intravenous contrast. CONTRAST:  13mL OMNIPAQUE IOHEXOL 300 MG/ML  SOLN COMPARISON:  09/26/2017. FINDINGS: Lower chest: No acute findings. Hepatobiliary: The liver shows decreased steatosis but increased capsular nodularity and caudate hypertrophy, highly suspicious for cirrhosis. No hepatic masses are identified. Multiple calcified gallstones are seen, however there is no evidence of cholecystitis or biliary ductal dilatation. Pancreas:  No mass or inflammatory changes. Spleen:  Within normal limits in size and appearance. Adrenals/Urinary Tract: New left adrenal mass is seen measuring 2.7 x 1.6 cm. New bilobed right adrenal mass is also seen with area involving the body of the gland measuring 2.6 x 1 point 8 cm on image 16/2. No renal parenchymal masses are identified, however there is bulky multinodular mass-like soft tissue density seen  throughout the right perinephric space and renal sinus, new since prior exam. No evidence of ureteral calculi or hydronephrosis. Unremarkable unopacified urinary bladder. Stomach/Bowel: Postop changes are seen from previous low anterior resection with left lower quadrant colostomy. A small parastomal hernia is seen containing a small bowel loop, and a 2nd small midline paraumbilical hernia is seen also containing a single small bowel loop. Presacral soft tissue density remains stable, consistent with post treatment changes. Diverticulosis is again seen involving the transverse and descending colon, without evidence of diverticulitis. No evidence of bowel obstruction, focal inflammatory process, or abscess. Vascular/Lymphatic: New retroperitoneal lymphadenopathy is seen in the left paraaortic region measuring 2.3 cm short axis. No pelvic lymphadenopathy identified. No acute vascular findings. Aortic atherosclerotic calcification noted. Other:  Minimal ascites seen in right paracolic gutter. Musculoskeletal:  No suspicious bone lesions identified. IMPRESSION: New bilateral adrenal masses, consistent with metastatic disease. Bulky multinodular mass-like soft tissue density throughout the right perinephric space and renal sinus, consistent with malignancy. Differential diagnosis includes metastatic disease and lymphoma. New left paraaortic retroperitoneal lymphadenopathy, consistent with malignancy. Differential diagnosis includes metastatic disease and lymphoma. Cirrhosis.  No evidence of hepatic neoplasm. Two small ventral abdominal wall hernias, as described above. Cholelithiasis. No radiographic evidence of cholecystitis. Colonic diverticulosis, without radiographic evidence of diverticulitis. Aortic Atherosclerosis (ICD10-I70.0). Electronically Signed   By: Marlaine Hind M.D.   On: 11/27/2020 11:35   DG Chest Port 1 View  Result Date: 11/22/2020 CLINICAL DATA:  Pt's wife reports pt was diagnosed with Covid on  11/09/20. She reports he was been very weak, had loss of appetite, hacking cough, new BLE swelling, loss of 20lbs. Pt was given antibiotic and steroids when he was diagnosed with Covid  EXAM: PORTABLE CHEST 1 VIEW COMPARISON:  01/18/2016 FINDINGS: Cardiac silhouette normal size.  No mediastinal or hilar masses. Prominent interstitial/bronchovascular markings are stable. No evidence of pneumonia or pulmonary edema. No pleural effusion or pneumothorax. Skeletal structures are grossly intact. IMPRESSION: No acute cardiopulmonary disease. Electronically Signed   By: Lajean Manes M.D.   On: 11/22/2020 14:26    Labs:  CBC: Recent Labs    11/18/20 1218 11/22/20 1341 12/01/20 1715 12/02/20 0247  WBC 6.3 5.0 4.8 5.6  HGB 14.5 13.5 14.8 14.2  HCT 43.6 40.9 44.2 42.0  PLT 115* 120* 192 182    COAGS: Recent Labs    12/01/20 1715 12/02/20 0247 12/02/20 1044  INR  --   --  1.3*  APTT 39* 64* 123*    BMP: Recent Labs    11/18/20 1218 11/22/20 1341 11/26/20 1203 12/01/20 1715  NA 136 135 134* 136  K 4.1 4.1 3.9 3.9  CL 97* 97* 97* 99  CO2 27 25 23  18*  GLUCOSE 70 69* 67* 75  BUN 15 15 13 15   CALCIUM 10.4* 10.1 10.4* 11.3*  CREATININE 0.57* 0.63 0.58* 0.59*  GFRNONAA >60 >60 >60 >60    LIVER FUNCTION TESTS: Recent Labs    11/18/20 1218 11/22/20 1341 12/01/20 1715  BILITOT 1.3* 1.1 1.2  AST 59* 62* 63*  ALT 44 40 32  ALKPHOS 80 89 87  PROT 6.5 6.3* 7.2  ALBUMIN 3.2* 2.8* 3.3*    TUMOR MARKERS: Recent Labs    11/18/20 1218  CEA 2.12    Assessment and Plan: 87 y.o. 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  He now presents to [facility] with failure to thrive, anorexia/weight loss, dyspnea, heel pain.  Recent imaging has revealed :   . Scattered and dependent bilateral ground-glass airspace opacity with irregular peripheral interstitial opacity and interlobular septal thickening, most conspicuous at the bilateral lung bases. Findings are nonspecific and infectious or inflammatory, including COVID-19 airspace disease if clinically suspected.  New bilateral adrenal nodules, concerning for metastatic disease. There is there is no intrathoracic evidence of malignancy.  There is extensive, ill-defined soft tissue stranding about the right kidney and within the adjacent perinephric fat. This is also highly suspicious for malignancy, perhaps lymphomatous involvement of the right kidney given appearance.  Recommend dedicated contrast enhanced imaging of the abdomen and pelvis to further evaluate above described abdominal findings.  Cholelithiasis.  Emphysema. . Coronary artery disease  Current labs include PT 16, INR 1.3, WBC 5.6, hemoglobin 14.2, platelets 182k, creatinine 0.59; request now received from oncology for CT-guided right perirenal mass biopsy for further evaluation.  Imaging studies were reviewed by Dr. Laurence Ferrari.Risks and benefits of procedure was discussed with the patient /spouse including, but not limited to bleeding, infection, damage to adjacent structures or low yield requiring additional tests.  All of the questions were answered and there is agreement to proceed.  Consent signed and in chart.  Procedure scheduled for 7/7.    Thank you for this interesting consult.  I greatly enjoyed meeting Cody Hurley and look forward to participating in their care.  A copy of this report was sent to the  requesting provider on this date.  Electronically Signed: D. Rowe Robert, PA-C 12/02/2020, 11:55 AM   I spent a total of  25 minutes   in face to face in  clinical consultation, greater than 50% of which was counseling/coordinating care for CT-guided biopsy of right perirenal mass

## 2020-12-02 NOTE — Evaluation (Signed)
Physical Therapy Evaluation Patient Details Name: Cody Hurley MRN: [id number] DOB: [birth year] Today's Date: 12/02/2020   History of Present Illness  87 yo male admitted with retroperitoneal mass, weakness, FTT. Hx of stage III rectal ca, LAR 2017, prostate ca, PE, anorexia, COVID  Clinical Impression  On eval, pt required Min-Mod A for mobility. He walked ~100 feet with a RW. Pt presents with general weakness, decreased activity tolerance, and impaired gait and balance. Pt tolerated activity well. Discussed d/c plan with wife-she wants to be able to take pt back home if he is strong enough. She can provide limited physical assistance only. If they return home, would benefit from [place], [place], and a home health aide. She is open to SNF rehab but only if it is necessary. Will plan to follow and progress activity as tolerated.     Follow Up Recommendations Home health PT;Supervision/Assistance - 24 hour vs SNF (depending on progress. Wife prefers to take pt home but understands that they may need to consider rehab)    Equipment Recommendations  None recommended by PT    Recommendations for Other Services       Precautions / Restrictions Precautions Precautions: Fall Precaution Comments: colostomy Restrictions Weight Bearing Restrictions: No      Mobility  Bed Mobility Overal bed mobility: Needs Assistance Bed Mobility: Supine to Sit     Supine to sit: HOB elevated;Mod assist     General bed mobility comments: Assist for LEs and trunk and to scoot to EOB. Increased time.    Transfers   Equipment used: Rolling walker (2 wheeled) Transfers: Sit to/from Stand Sit to Stand: From elevated surface;Min assist         General transfer comment: Assist to power up, steady, control descent. Cues for safety, hand placement.  Ambulation/Gait Ambulation/Gait assistance: Min assist Gait Distance (Feet): 100 Feet Assistive device: Rolling walker (2 wheeled) Gait Pattern/deviations:  Step-through pattern;Decreased stride length     General Gait Details: Cues for safety, posture, RW proximity. Assist to stabilize throughout distance. Pt tolerated distance well.  Stairs            Wheelchair Mobility    Modified Rankin (Stroke Patients Only)       Balance Overall balance assessment: Needs assistance         Standing balance support: Bilateral upper extremity supported Standing balance-Leahy Scale: Poor                               Pertinent Vitals/Pain Pain Assessment: No/denies pain    Home Living Family/patient expects to be discharged to:: Unsure Living Arrangements: Spouse/significant other Available Help at Discharge: Family Type of Home: House Home Access: Stairs to enter Entrance Stairs-Rails: None Entrance Stairs-Number of Steps: 2 Home Layout: One level Home Equipment: Environmental consultant - 2 wheels      Prior Function Level of Independence: Needs assistance   Gait / Transfers Assistance Needed: Mod I with RW up until ~1 week prior to admission  ADL's / Homemaking Assistance Needed: Mod I up until ~1 week prior to admission        Hand Dominance        Extremity/Trunk Assessment   Upper Extremity Assessment Upper Extremity Assessment: Defer to OT evaluation    Lower Extremity Assessment Lower Extremity Assessment: Generalized weakness    Cervical / Trunk Assessment Cervical / Trunk Assessment: Kyphotic  Communication   Communication: HOH  Cognition Arousal/Alertness: Awake/alert Behavior During  Therapy: WFL for tasks assessed/performed Overall Cognitive Status: Within Functional Limits for tasks assessed                                 General Comments: very HOH      General Comments      Exercises     Assessment/Plan    PT Assessment Patient needs continued PT services  PT Problem List Decreased strength;Decreased mobility;Decreased activity tolerance;Decreased balance;Decreased  knowledge of use of DME       PT Treatment Interventions DME instruction;Gait training;Therapeutic exercise;Balance training;Functional mobility training;Therapeutic activities;Patient/family education    PT Goals (Current goals can be found in the Care Plan section)  Acute Rehab PT Goals Patient Stated Goal: get stronger and return home PT Goal Formulation: With patient/family Time For Goal Achievement: 12/16/20 Potential to Achieve Goals: Fair    Frequency Min 3X/week   Barriers to discharge        Co-evaluation               AM-PAC PT "6 Clicks" Mobility  Outcome Measure Help needed turning from your back to your side while in a flat bed without using bedrails?: A Little Help needed moving from lying on your back to sitting on the side of a flat bed without using bedrails?: A Lot Help needed moving to and from a bed to a chair (including a wheelchair)?: A Little Help needed standing up from a chair using your arms (e.g., wheelchair or bedside chair)?: A Little Help needed to walk in hospital room?: A Little Help needed climbing 3-5 steps with a railing? : A Lot 6 Click Score: 16    End of Session Equipment Utilized During Treatment: Gait belt Activity Tolerance: Patient tolerated treatment well Patient left: in chair;with call bell/phone within reach;with chair alarm set;with family/visitor present   PT Visit Diagnosis: Muscle weakness (generalized) (M62.81);Difficulty in walking, not elsewhere classified (R26.2)    Time: 0321-2248 PT Time Calculation (min) (ACUTE ONLY): 33 min   Charges:   PT Evaluation $PT Eval Moderate Complexity: 1 Mod PT Treatments $Gait Training: 8-22 mins           Doreatha Massed, PT Acute Rehabilitation  Office: [phone] Pager: [phone]

## 2020-12-03 ENCOUNTER — Inpatient Hospital Stay (HOSPITAL_COMMUNITY): Payer: Medicare Other

## 2020-12-03 DIAGNOSIS — Z85048 Personal history of other malignant neoplasm of rectum, rectosigmoid junction, and anus: Secondary | ICD-10-CM | POA: Diagnosis not present

## 2020-12-03 DIAGNOSIS — R1900 Intra-abdominal and pelvic swelling, mass and lump, unspecified site: Secondary | ICD-10-CM | POA: Diagnosis not present

## 2020-12-03 DIAGNOSIS — R627 Adult failure to thrive: Secondary | ICD-10-CM | POA: Diagnosis not present

## 2020-12-03 DIAGNOSIS — U071 COVID-19: Secondary | ICD-10-CM | POA: Diagnosis not present

## 2020-12-03 LAB — COMPREHENSIVE METABOLIC PANEL
ALT: 27 U/L (ref 0–44)
AST: 56 U/L — ABNORMAL HIGH (ref 15–41)
Albumin: 2.7 g/dL — ABNORMAL LOW (ref 3.5–5.0)
Alkaline Phosphatase: 68 U/L (ref 38–126)
Anion gap: 16 — ABNORMAL HIGH (ref 5–15)
BUN: 11 mg/dL (ref 8–23)
CO2: 16 mmol/L — ABNORMAL LOW (ref 22–32)
Calcium: 9.6 mg/dL (ref 8.9–10.3)
Chloride: 103 mmol/L (ref 98–111)
Creatinine, Ser: 0.41 mg/dL — ABNORMAL LOW (ref 0.61–1.24)
GFR, Estimated: >60 mL/min (ref >60)
Glucose, Bld: 63 mg/dL — ABNORMAL LOW (ref 70–99)
Potassium: 3.6 mmol/L (ref 3.5–5.1)
Sodium: 135 mmol/L (ref 135–145)
Total Bilirubin: 1.1 mg/dL (ref 0.3–1.2)
Total Protein: 6 g/dL — ABNORMAL LOW (ref 6.5–8.1)

## 2020-12-03 LAB — PHOSPHORUS: Phosphorus: 2.1 mg/dL — ABNORMAL LOW (ref 2.5–4.6)

## 2020-12-03 LAB — CBC
HCT: 39.8 % (ref 39.0–52.0)
Hemoglobin: 13.4 g/dL (ref 13.0–17.0)
MCH: 34.6 pg — ABNORMAL HIGH (ref 26.0–34.0)
MCHC: 33.7 g/dL (ref 30.0–36.0)
MCV: 102.8 fL — ABNORMAL HIGH (ref 80.0–100.0)
Platelets: 196 10*3/uL (ref 150–400)
RBC: 3.87 MIL/uL — ABNORMAL LOW (ref 4.22–5.81)
RDW: 16.2 % — ABNORMAL HIGH (ref 11.5–15.5)
WBC: 3.6 10*3/uL — ABNORMAL LOW (ref 4.0–10.5)
nRBC: 0 % (ref 0.0–0.2)

## 2020-12-03 LAB — MAGNESIUM: Magnesium: 1.7 mg/dL (ref 1.7–2.4)

## 2020-12-03 LAB — HEPARIN LEVEL (UNFRACTIONATED): Heparin Unfractionated: 0.57 IU/mL (ref 0.30–0.70)

## 2020-12-03 LAB — CORTISOL: Cortisol, Plasma: 21.2 ug/dL

## 2020-12-03 LAB — APTT: aPTT: 103 seconds — ABNORMAL HIGH (ref 24–36)

## 2020-12-03 MED ORDER — HEPARIN (PORCINE) 25000 UT/250ML-% IV SOLN
800.0000 [IU]/h | INTRAVENOUS | Status: DC
  Administered 2020-12-04: 700 [IU]/h via INTRAVENOUS
  Filled 2020-12-03 (×4): qty 250

## 2020-12-03 MED ORDER — FENTANYL CITRATE (PF) 100 MCG/2ML IJ SOLN
INTRAMUSCULAR | Status: AC
  Filled 2020-12-03: qty 2

## 2020-12-03 MED ORDER — MIDAZOLAM HCL 2 MG/2ML IJ SOLN
INTRAMUSCULAR | Status: AC | PRN
  Administered 2020-12-03: 0.5 mg via INTRAVENOUS

## 2020-12-03 MED ORDER — FLUCONAZOLE 100 MG PO TABS
100.0000 mg | ORAL_TABLET | Freq: Every day | ORAL | Status: AC
  Administered 2020-12-03 – 2020-12-07 (×5): 100 mg via ORAL
  Filled 2020-12-03 (×5): qty 1

## 2020-12-03 MED ORDER — LIDOCAINE HCL (PF) 1 % IJ SOLN
INTRAMUSCULAR | Status: AC | PRN
  Administered 2020-12-03: 10 mL

## 2020-12-03 MED ORDER — NYSTATIN 100000 UNIT/ML MT SUSP
5.0000 mL | Freq: Four times a day (QID) | OROMUCOSAL | Status: AC
  Administered 2020-12-03 – 2020-12-08 (×21): 500000 [IU] via ORAL
  Filled 2020-12-03 (×21): qty 5

## 2020-12-03 MED ORDER — K PHOS MONO-SOD PHOS DI & MONO 155-852-130 MG PO TABS
250.0000 mg | ORAL_TABLET | Freq: Two times a day (BID) | ORAL | Status: AC
  Administered 2020-12-03 – 2020-12-05 (×6): 250 mg via ORAL
  Filled 2020-12-03 (×6): qty 1

## 2020-12-03 MED ORDER — PREDNISONE 20 MG PO TABS
20.0000 mg | ORAL_TABLET | Freq: Every day | ORAL | Status: DC
  Administered 2020-12-04 – 2020-12-09 (×6): 20 mg via ORAL
  Filled 2020-12-03 (×6): qty 1

## 2020-12-03 MED ORDER — FENTANYL CITRATE (PF) 100 MCG/2ML IJ SOLN
INTRAMUSCULAR | Status: AC | PRN
  Administered 2020-12-03: 25 ug via INTRAVENOUS

## 2020-12-03 MED ORDER — ADULT MULTIVITAMIN W/MINERALS CH
1.0000 | ORAL_TABLET | Freq: Every day | ORAL | Status: DC
  Administered 2020-12-04 – 2020-12-09 (×6): 1 via ORAL
  Filled 2020-12-03 (×6): qty 1

## 2020-12-03 MED ORDER — ENSURE ENLIVE PO LIQD
237.0000 mL | Freq: Two times a day (BID) | ORAL | Status: DC
  Administered 2020-12-03 – 2020-12-19 (×23): 237 mL via ORAL

## 2020-12-03 MED ORDER — STERILE WATER FOR INJECTION IV SOLN
INTRAVENOUS | Status: DC
  Filled 2020-12-03: qty 150
  Filled 2020-12-03: qty 1000
  Filled 2020-12-03: qty 150
  Filled 2020-12-03: qty 1000
  Filled 2020-12-03: qty 150

## 2020-12-03 MED ORDER — MAGNESIUM SULFATE 2 GM/50ML IV SOLN
2.0000 g | Freq: Once | INTRAVENOUS | Status: AC
  Administered 2020-12-03: 2 g via INTRAVENOUS
  Filled 2020-12-03: qty 50

## 2020-12-03 MED ORDER — MIDAZOLAM HCL 2 MG/2ML IJ SOLN
INTRAMUSCULAR | Status: AC
  Filled 2020-12-03: qty 2

## 2020-12-03 NOTE — Progress Notes (Addendum)
PROGRESS NOTE    Cody Hurley  DZH:[id number] DOB: 11-08-1925 DOA: 12/01/2020 PCP: [name], MD    No chief complaint on file.   Brief Narrative:  HPI per [name] is a 87 y.o. male with history of rectal cancer last treated on 2017-experiencing increasing weakness and poor appetite.  Patient was diagnosed with COVID infection about 2 weeks ago was treated with antiviral and p.o. prednisone.  Subsequent which patient also had come to the ER on June 26 with shortness of breath and at the time patient CT angiogram showed 2 small pulmonary embolism and was placed on apixaban.  The CT scan also showed retroperitoneal lesions concerning for metastatic cancer.  Patient had subsequently followed with [name] patient's oncologist who ordered a CT abdomen pelvis confirms multiple lesions and since patient has been progressively worsening and finding it difficult to ambulate has poor appetite lost at least 20 to 30 pounds in the last month.  Patient admitted for further work-up.  On exam patient appears generally weak otherwise nonfocal.  Has mild swelling of the legs.     Assessment & Plan:   Principal Problem:   Retroperitoneal mass Active Problems:   Pulmonary embolism (HCC)   FTT (failure to thrive) in adult   Hypercalcemia   History of rectal cancer   History of prostate cancer   COVID-19 virus infection: Recent COVID-19 infection status posttreatment  1 retroperitoneal renal lesions concerning for metastatic cancer -Patient seen in consultation by oncology who have consulted with IR for evaluation for biopsy which was done early on this morning. -Continue to hold apixaban and patient currently on heparin. -Per oncology, IR.  2.  Recent diagnosis of PE -Apixaban on hold for biopsy which was done this morning.   -Continue heparin for now and will need to discuss with IR when patient's apixaban can be resumed hopefully in the next 24 to 48 hours.    3.   History of rectal cancer and prostate cancer -Being followed by oncology, [name].  4.  Hypercalcemia -Likely hypercalcemia of malignancy. -Ionized calcium pending.   -Corrected calcium of 10.64.   -Phosphorus level at 2.1 which we will replete.   -Magnesium at 1.7, magnesium sulfate 2 g IV x1.   -Continue IV fluids.   -Repeat labs in the morning and if no continued improvement may need a trial of bisphosphonates.   -Per oncology.    5.  Recent COVID-19 infection -Status posttreatment with antiviral and steroids. -Asymptomatic. -Sats of 98% on 2 L nasal cannula.  6.  Failure to thrive -Likely secondary to #1, #3. -Biopsies pending. -Random cortisol at 21.2.  -May need palliative care involvement once biopsies are finalized and if patient felt not a candidate for any further chemotherapy. -We will defer timing of palliative care involvement to oncology.  7.  Acidosis -Questionable etiology. -Check a UA with cultures and sensitivities, chest x-ray. -Change IV fluids to bicarb drip. -Repeat labs in the morning.  8.  Oral thrush -Patient started on fluconazole 100 mg daily per oncology. -Add nystatin swish and swallow.  9.  Hypophosphatemia -K-Phos Neutral 250 mg p.o. twice daily x3 days.   DVT prophylaxis: Heparin Code Status: Full Family Communication: Updated patient, wife, daughter at bedside. Disposition:   Status is: Inpatient  Remains inpatient appropriate because:Inpatient level of care appropriate due to severity of illness  Dispo: The patient is from: Home  Anticipated d/c is to: Home              Patient currently is not medically stable to d/c.   Difficult to place patient No       Consultants:  Oncology: [name] IR: Dr. Laurence Ferrari 12/02/2020  Procedures:  CT-guided core needle biopsy of infiltrative right retroperitoneal/perirenal mass per Dr. Pascal Lux IR 12/03/2020  Antimicrobials:  None   Subjective: Sitting up trying to  eat his lunch.  No chest pain.  No shortness of breath.  Patient with poor appetite per wife at bedside.   Objective: Vitals:   12/03/20 1115 12/03/20 1130 12/03/20 1135 12/03/20 1140  BP: (!) 104/52 (!) 112/47 (!) 109/46 (!) 114/47  Pulse: 85 87 88 88  Resp: 20 (!) 22 (!) 24 (!) 22  Temp:      TempSrc:      SpO2: 98% 99% 99% 98%    Intake/Output Summary (Last 24 hours) at 12/03/2020 1343 Last data filed at 12/03/2020 0600 Gross per 24 hour  Intake 2578.66 ml  Output 300 ml  Net 2278.66 ml    There were no vitals filed for this visit.  Examination:  General exam: NAD.  Oral thrush. Respiratory system: CTA B.  No wheezes, no crackles, no rhonchi.  Normal respiratory effort.   Cardiovascular system: Regular rate rhythm no murmurs rubs or gallops.  No JVD.  No lower extremity edema.  Gastrointestinal system: Abdomen is soft, nontender, nondistended, positive bowel sounds.  Ostomy bag intact with loose stool.  Central nervous system: Alert.  Moving extremities spontaneously.  No focal neurological deficits.   Extremities: Symmetric 5 x 5 power. Skin: No rashes, lesions or ulcers Psychiatry: Judgement and insight appear normal. Mood & affect appropriate.     Data Reviewed: I have personally reviewed following labs and imaging studies  CBC: Recent Labs  Lab 12/01/20 1715 12/02/20 0247 12/03/20 0657  WBC 4.8 5.6 3.6*  NEUTROABS 3.0  --   --   HGB 14.8 14.2 13.4  HCT 44.2 42.0 39.8  MCV 102.1* 102.4* 102.8*  PLT 192 182 196     Basic Metabolic Panel: Recent Labs  Lab 12/01/20 1715 12/03/20 0657  NA 136 135  K 3.9 3.6  CL 99 103  CO2 18* 16*  GLUCOSE 75 63*  BUN 15 11  CREATININE 0.59* 0.41*  CALCIUM 11.3* 9.6  MG  --  1.7  PHOS  --  2.1*     GFR: Estimated Creatinine Clearance: 50.1 mL/min (A) (by C-G formula based on SCr of 0.41 mg/dL (L)).  Liver Function Tests: Recent Labs  Lab 12/01/20 1715 12/03/20 0657  AST 63* 56*  ALT 32 27  ALKPHOS 87 68   BILITOT 1.2 1.1  PROT 7.2 6.0*  ALBUMIN 3.3* 2.7*     CBG: No results for input(s): GLUCAP in the last 168 hours.   No results found for this or any previous visit (from the past 240 hour(s)).       Radiology Studies: DG CHEST PORT 1 VIEW  Result Date: 12/03/2020 CLINICAL DATA:  87 y.o. male with history of rectal cancer last treated on 2017-experiencing increasing weakness and poor appetite. Patient was diagnosed with COVID infection about 2 weeks ago was treated with antiviral and p.o. prednisone. EXAM: PORTABLE CHEST - 1 VIEW COMPARISON:  11/22/2020 FINDINGS: Worsening right mid lung and suprahilar interstitial opacities. Coarse peripheral bronchovascular markings bilaterally as before. Heart size and mediastinal contours are within normal limits. Aortic Atherosclerosis (ICD10-170.0). No  effusion.  No pneumothorax. Visualized bones unremarkable. IMPRESSION: Interval worsening of right mid lung and suprahilar pulmonary opacities since prior study Electronically Signed   By: Lucrezia Europe M.D.   On: 12/03/2020 11:35        Scheduled Meds:  feeding supplement  237 mL Oral BID BM   fentaNYL       fluconazole  100 mg Oral Daily   midazolam       multivitamin with minerals  1 tablet Oral Daily   phosphorus  250 mg Oral BID   [START ON 12/04/2020] predniSONE  20 mg Oral Q breakfast   Continuous Infusions:  heparin      sodium bicarbonate (isotonic) infusion in sterile water 125 mL/hr at 12/03/20 1015     LOS: 2 days    Time spent: 35 minutes    Irine Seal, MD [facility]   To contact the attending provider between 7A-7P or the covering provider during after hours 7P-7A, please log into the web site [email or web address] and access using universal  password for that web site. If you do not have the password, please call the hospital operator.  12/03/2020, 1:43 PM

## 2020-12-03 NOTE — Progress Notes (Addendum)
IP PROGRESS NOTE  Subjective:   Cody Hurley continues to complain of dry mouth.  Still very weak overall but he has no other complaints this morning  Objective: Vital signs in last 24 hours: Blood pressure (!) 114/56, pulse 90, temperature 98.6 F (37 C), temperature source Oral, resp. rate 16, SpO2 93 %.  Intake/Output from previous day: 07/06 0701 - 07/07 0700 In: 2938.7 [P.O.:360; I.V.:2578.7] Out: 550 [Urine:550]  Physical Exam:  HEENT: Dryness of the tongue and buccal mucosa, thrush noted on tongue Lungs: Clear anteriorly Cardiac: Regular rate and rhythm Abdomen: No hepatosplenomegaly, no mass, nontender, left lower quadrant colostomy Extremities: No leg edema Neurologic: Alert, follows commands, oriented   Lab Results: Recent Labs    12/02/20 0247 12/03/20 0657  WBC 5.6 3.6*  HGB 14.2 13.4  HCT 42.0 39.8  PLT 182 196    BMET Recent Labs    12/01/20 1715 12/03/20 0657  NA 136 135  K 3.9 3.6  CL 99 103  CO2 18* 16*  GLUCOSE 75 63*  BUN 15 11  CREATININE 0.59* 0.41*  CALCIUM 11.3* 9.6    Lab Results  Component Value Date   CEA1 2.57 11/18/2020    Studies/Results: No results found.  Medications: I have reviewed the patient's current medications.  Assessment/Plan: Stage III rectal cancer,ypT4a,N2 status post a low anterior resection in September 2017 Presenting November 2016 with a mass at 10 cm from the anus, clinical stage II by EUS-T3N0 Neoadjuvant Xeloda and radiation beginning 06/01/2015, completed 20 of 28 planned fractions, discontinued secondary to toxicity Elevated pretreatment CEA CT chest 04/10/2019-no evidence of metastatic disease, changes of interstitial lung disease, changes suggestive of cirrhosis CT chest 11/22/2020-scattered dependent groundglass airspace opacity and irregular interstitial opacity-nonspecific infectious or inflammatory, new bilateral adrenal nodules, extensive ill-defined soft tissue surrounding the right kidney  suspicious for malignancy CT abdomen/pelvis 11/26/2020-bilateral adrenal masses, multinodular masslike soft tissue density throughout the right perinephric space and renal sinus, new left periaortic and retroperitoneal adenopathy, cirrhosis   Prostate cancer 1990 treated with a prostatectomy History of a TIA Carpal tunnel syndrome Palpable liver edge-ultrasound abdomen 04/10/2019-fatty infiltration of the liver, normal portal vein flow Left lung subsegmental pulmonary emboli 11/22/2020-apixaban Anorexia/weight loss COVID-19 infection June 2022 Admission 12/01/2020 with failure to thrive Elevated calcium-likely hypercalcemia of malignancy     Cody Hurley appears unchanged.  He continues to have anorexia and weakness.  I suspect he has a progressive malignancy, most likely recurrent rectal cancer.  He is scheduled for a diagnostic biopsy by IR later today.  We will have further discussions regarding goals of care pending these results.  If he has recurrent rectal cancer, may consider referral to hospice.  However, if biopsy confirms lymphoma, which is thought to be less likely, that he may be a candidate for treatment.  He has developed oral candidiasis.  We will begin fluconazole 100 mg daily.  Calcium improving with IV hydration.  Corrected calcium is now 10.2.  Ionized calcium pending.  Hypercalcemia likely due to underlying malignancy.  May consider bisphosphonate therapy if calcium level starts to rise.  Recommendations: 1.  Proceed with CT-guided biopsy of the right perirenal mass. 2.  Continue IV hydration and monitor calcium level closely.  We will follow-up on ionized calcium. 3.  Begin fluconazole 100 mg daily for oral candidiasis. 4.  Further discussion regarding goals of care pending biopsy results.   LOS: 2 days   Mikey Bussing, NP   12/03/2020, 8:25 AM Cody Hurley was interviewed and  examined.  He appears stable.  He has mild oral candidiasis.  We will prescribe Diflucan.  He is  scheduled to undergo biopsy of a retroperitoneal mass today.  We will follow-up on the pathology and decide on comfort care versus a trial of systemic therapy.  The calcium level has improved today.  He will continue intravenous hydration and we will follow-up on the ionized calcium level.  He will begin prednisone as an appetite stimulant.  I was present for greater than 50% of today's visit.  I performed medical decision making.

## 2020-12-03 NOTE — Procedures (Signed)
Pre procedural Dx: Peri-renal mass  Post procedural Dx: Same  Technically successful CT guided biopsy of indeterminate right sided peri-renal mass   EBL: Trace Complications: None immediate.   Ronny Bacon, MD Pager #: [phone]

## 2020-12-03 NOTE — Evaluation (Signed)
Occupational Therapy Evaluation Patient Details Name: Cody Hurley MRN: [id number] DOB: 08-01-1925 Today's Date: 12/03/2020    History of Present Illness 87 yo male admitted with retroperitoneal mass, weakness, FTT. Hx of stage III rectal ca, LAR 2017, prostate ca, PE, anorexia, recent COVID   Clinical Impression   Patient is a 87 year old male admitted for above diagnosis. Patient was noted to be lethargic on this date with decreased activity tolerance impacting patients ability to participate in ADLS and ability to transition back home with wife. Patient could benefit from skilled OT services in acute setting to mitigate barriers for patient to be able to transition back home with reduced caregiver burden. Patients wife wants to be able to take pt back home if he is strong enough. She can provide limited physical assistance only. If they return home, would benefit from home health. She is open to SNF rehab but only if it is necessary. Patient and wife are awaiting biopsy results prior to making any transitioning decisions at this time. Will plan to follow and progress activity as tolerated.       Follow Up Recommendations  Home health OT;SNF;Supervision/Assistance - 24 hour                 Precautions / Restrictions Precautions Precautions: Fall Precaution Comments: colostomy Restrictions Weight Bearing Restrictions: No      Mobility Bed Mobility Overal bed mobility: Needs Assistance Bed Mobility: Supine to Sit     Supine to sit: Min assist     General bed mobility comments: min A for supine to sit on edge of bed with education on proper hand and foot placement.    Transfers   Equipment used: Rolling walker (2 wheeled) Transfers: Sit to/from Stand Sit to Stand: From elevated surface;Min assist         General transfer comment: Assist to power up, steady, control descent. Cues for safety, hand placement.    Balance Overall balance assessment: Needs  assistance Sitting-balance support: Bilateral upper extremity supported Sitting balance-Leahy Scale: Fair     Standing balance support: Bilateral upper extremity supported Standing balance-Leahy Scale: Poor Standing balance comment: patient required min A to maintain standing balance when waving to family                           ADL either performed or assessed with clinical judgement   ADL Overall ADL's : Needs assistance/impaired Eating/Feeding: Set up;Sitting Eating/Feeding Details (indicate cue type and reason): patient was sitting on edge of bed attempting to eat. patient was educated on using chair during meals to conserve energy Grooming: Oral care;Set up;Sitting   Upper Body Bathing: Minimal assistance   Lower Body Bathing: Minimal assistance   Upper Body Dressing : Set up   Lower Body Dressing: Minimal assistance;Cueing for safety;Sit to/from stand   Toilet Transfer: Minimal assistance;RW;Ambulation   Toileting- Clothing Manipulation and Hygiene: Minimal assistance;Cueing for safety;Sit to/from stand       Functional mobility during ADLs: Minimal assistance;Rolling walker;Cueing for sequencing General ADL Comments: cues to keep walker close to patient.     Vision   Vision Assessment?: No apparent visual deficits     Perception     Praxis      Pertinent Vitals/Pain Pain Assessment: No/denies pain     Hand Dominance     Extremity/Trunk Assessment Upper Extremity Assessment Upper Extremity Assessment: Overall WFL for tasks assessed       Cervical / Trunk  Assessment Cervical / Trunk Assessment: Kyphotic   Communication Communication Communication: HOH   Cognition Arousal/Alertness: Awake/alert Behavior During Therapy: WFL for tasks assessed/performed Overall Cognitive Status: Within Functional Limits for tasks assessed                                 General Comments: very HOH with hearing aids in place.   General  Comments       Exercises     Shoulder Instructions      Home Living Family/patient expects to be discharged to:: Unsure (awaiting biopsy results at this time.) Living Arrangements: Spouse/significant other Available Help at Discharge: Family Type of Home: House Home Access: Stairs to enter [employer] of Steps: 2 Entrance Stairs-Rails: None Home Layout: One level               Home Equipment: Walker - 4 wheels   Additional Comments: patients wife reported using rollator for ADLs.      Prior Functioning/Environment    Gait / Transfers Assistance Needed: Mod I with RW up until ~1 week prior to admission ADL's / Homemaking Assistance Needed: Mod I up until ~1 week prior to admission. patients wife would check in on ADLs as needed            OT Problem List: Decreased strength;Decreased safety awareness;Decreased activity tolerance;Decreased coordination;Impaired balance (sitting and/or standing)      OT Treatment/Interventions: Self-care/ADL training;DME and/or AE instruction;Therapeutic activities;Balance training;Therapeutic exercise;Neuromuscular education;Patient/family education    OT Goals(Current goals can be found in the care plan section)    OT Frequency: Min 2X/week   Barriers to D/C:            Co-evaluation              AM-PAC OT "6 Clicks" Daily Activity     Outcome Measure Help from another person eating meals?: A Little Help from another person taking care of personal grooming?: A Little Help from another person toileting, which includes using toliet, bedpan, or urinal?: A Lot Help from another person bathing (including washing, rinsing, drying)?: A Lot Help from another person to put on and taking off regular upper body clothing?: A Little Help from another person to put on and taking off regular lower body clothing?: A Lot 6 Click Score: 15   End of Session Equipment Utilized During Treatment: Gait belt;Rolling walker Nurse  Communication:  (nurse cleared patient to participate in therapy.)  Activity Tolerance: Patient tolerated treatment well Patient left: in chair;with call bell/phone within reach;with family/visitor present;with chair alarm set  OT Visit Diagnosis: Unsteadiness on feet (R26.81);Muscle weakness (generalized) (M62.81)                Time: 4888-9169 OT Time Calculation (min): 22 min Charges:  OT General Charges $OT Visit: 1 Visit OT Evaluation $OT Eval Low Complexity: 1 Low  Jackelyn Poling OTR/L, MS Acute Rehabilitation Department Office# [phone] Pager# [phone]   [place] 12/03/2020, 4:17 PM

## 2020-12-03 NOTE — Progress Notes (Signed)
Initial Nutrition Assessment  INTERVENTION:   Once diet advanced: -Ensure Enlive po BID, each supplement provides 350 kcal and 20 grams of protein  -Magic cup TID with meals, each supplement provides 290 kcal and 9 grams of protein  -Multivitamin with minerals daily  NUTRITION DIAGNOSIS:   Inadequate oral intake related to acute illness as evidenced by per patient/family report.  GOAL:   Patient will meet greater than or equal to 90% of their needs  MONITOR:   PO intake, Supplement acceptance, Labs, Weight trends, I & O's  REASON FOR ASSESSMENT:   Malnutrition Screening Tool    ASSESSMENT:   87 y.o. male with history of rectal cancer last treated on 2017-experiencing increasing weakness and poor appetite.  Patient was diagnosed with COVID infection about 2 weeks ago was treated with antiviral and p.o. prednisone.  Subsequent which patient also had come to the ER on June 26 with shortness of breath and at the time patient CT angiogram showed 2 small pulmonary embolism and was placed on apixaban.  The CT scan also showed retroperitoneal lesions concerning for metastatic cancer.  Patient not in room at time of visit. Having biopsy of right peritoneal mass.  Pt's wife and daughter at bedside. State he has not been eating well for the past 3 weeks to a month. He tries to eat his meals but gets full quickly. States he eats bites and gets overwhelmed by trays that have too many items on them. Pt also with thrush but they deny he's had any taste changes. States he does have some difficulty swallowing d/t thrush.  His caretaker at night was able to get him to eat some crackers and pudding last night. His wife states he was trying to drink 1-2 high protein shakes at home.  Discussed ordering 1 entree and 1 side for meals. RD to order Ensure supplements between meals and Magic cups on meal trays.  Per pt's wife, he has lost 10 lbs over the past month. Per weight records, pt has lost 12 lbs  since 6/22 (8% wt loss x 3 weeks, significant for time frame).  Medications: IV Mg sulfate  Labs reviewed:  Low Phos (2.1)  NUTRITION - FOCUSED PHYSICAL EXAM:  Unable to complete  Diet Order:   Diet Order             Diet NPO time specified Except for: Sips with Meds  Diet effective midnight                   EDUCATION NEEDS:   No education needs have been identified at this time  Skin:  Skin Assessment: Reviewed RN Assessment  Last BM:  7/6  Height:   Ht Readings from Last 1 Encounters:  12/01/20 5\' 7"  (1.702 m)    Weight:   Wt Readings from Last 1 Encounters:  12/01/20 61.4 kg   BMI:  21.1 kg/m^2  Estimated Nutritional Needs:   Kcal:  1800-2000  Protein:  85-95g  Fluid:  2L/day  Clayton Bibles, MS, RD, LDN Inpatient Clinical Dietitian Contact information available via Amion

## 2020-12-03 NOTE — Progress Notes (Addendum)
[place] for IV heparin (PTA apixaban) Indication: Recent Hx PE  Allergies  Allergen Reactions   Penicillin G Rash    Patient Measurements:   Heparin Dosing Weight: TBW  Vital Signs: Temp: 98.6 F (37 C) (07/07 0459) Temp Source: Oral (07/07 0459) BP: 114/56 (07/07 0459) Pulse Rate: 90 (07/07 0459)  Labs: Recent Labs    12/01/20 1715 12/02/20 0247 12/02/20 1044 12/02/20 2023 12/03/20 0657  HGB 14.8 14.2  --   --  13.4  HCT 44.2 42.0  --   --  39.8  PLT 192 182  --   --  196  APTT 39* 64* 123* 133* 103*  LABPROT  --   --  16.0*  --   --   INR  --   --  1.3*  --   --   HEPARINUNFRC >1.10*  --   --   --  0.57  CREATININE 0.59*  --   --   --  0.41*     Estimated Creatinine Clearance: 50.1 mL/min (A) (by C-G formula based on SCr of 0.41 mg/dL (L)).   Medical History: Past Medical History:  Diagnosis Date   Colorectal cancer ([place]) 2016   Prostate cancer ([place])     Medications:  Medications Prior to Admission  Medication Sig Dispense Refill Last Dose   acetaminophen (TYLENOL) 500 MG tablet Take 500-1,000 mg by mouth every 6 (six) hours as needed for mild pain (or headaches).   unk   APIXABAN (ELIQUIS) VTE STARTER PACK (10MG  AND 5MG ) Take as directed on package: start with two-5mg  tablets twice daily for 7 days. On day 8, switch to one-5mg  tablet twice daily. (Patient taking differently: Take 5-10 mg by mouth See admin instructions. Take 10 mg by mouth two times a day for 7 days, then decrease to 5 mg two times a day on "day 8") 1 each 0 12/01/2020 at 0730   Cholecalciferol (VITAMIN D-3) 25 MCG (1000 UT) CAPS Take 2,000 Units by mouth daily with breakfast.   12/01/2020 at am   Multiple Vitamin (MULTIVITAMIN) tablet Take 1 tablet by mouth daily.   12/01/2020   oxybutynin (DITROPAN) 5 MG tablet Take 5 mg by mouth daily.   12/01/2020 at am   vitamin B-12 (CYANOCOBALAMIN) 100 MCG tablet Take 100 mcg by mouth daily.   12/01/2020   Scheduled:   Infusions:   sodium chloride 125 mL/hr at 12/03/20 2703   heparin 750 Units/hr (12/02/20 2252)   Assessment: 6 yoM with PMH rectal cancer, recently started on Eliquis for PE 11/22/20, admitted directly from [facility] for inpatient workup for possible metastatic malignancy. Eliquis held for retroperitoneal/renal Bx, with Pharmacy to dose heparin in its place.  Baseline aPTT slightly elevated; baseline heparin level markedly elevated as expected with recent DOAC Prior anticoagulation: Eliquis 10 mg bid, transitioned to 5 mg BID today 7/5 with last dose at 0730  Significant events:  Today, 12/03/2020: HL/aPTT correlation this AM with HL at goal at 0.57 (though aPTT slightly above goal @ 103) Hgb down slightly to 13.4 Per RN, no bleeding or issues to report  Goal of Therapy: Heparin level 0.3-0.7 units/ml Monitor platelets by anticoagulation protocol: Yes  Plan: Stop heparin per IR for planned biopsy today F/U after biopsy for resuming heparin and next HL Plan to resume at 700 units/hr  Daily CBC and heparin levelMonitor for signs of bleeding or thrombosis F/u for ability to resume Eliquis   Ulice Dash D  12/03/2020 8:14  AM  Addendum:   Procedure: bx peri-renal mass    Completed: 7/7 ~ 1200  Post-Procedural bleeding risk per IR MD assessment:  standard  Antithrombotic medications on inpatient or PTA profile prior to procedure:   heparin infusion    Recommended restart time per IR Post-Procedure Guidelines:  6 hours post-procedure   Other considerations:      Plan:     Will resume heparin infusion at 1800 (~ 6 hours post-procedure) and check a level 8 hours later.   Ulice Dash D 12:49 PM

## 2020-12-04 DIAGNOSIS — E872 Acidosis: Secondary | ICD-10-CM

## 2020-12-04 DIAGNOSIS — Z85048 Personal history of other malignant neoplasm of rectum, rectosigmoid junction, and anus: Secondary | ICD-10-CM | POA: Diagnosis not present

## 2020-12-04 DIAGNOSIS — J189 Pneumonia, unspecified organism: Secondary | ICD-10-CM

## 2020-12-04 DIAGNOSIS — U071 COVID-19: Secondary | ICD-10-CM | POA: Diagnosis not present

## 2020-12-04 DIAGNOSIS — R1900 Intra-abdominal and pelvic swelling, mass and lump, unspecified site: Secondary | ICD-10-CM | POA: Diagnosis not present

## 2020-12-04 DIAGNOSIS — R627 Adult failure to thrive: Secondary | ICD-10-CM | POA: Diagnosis not present

## 2020-12-04 LAB — CBC
HCT: 37.3 % — ABNORMAL LOW (ref 39.0–52.0)
Hemoglobin: 12.4 g/dL — ABNORMAL LOW (ref 13.0–17.0)
MCH: 34.3 pg — ABNORMAL HIGH (ref 26.0–34.0)
MCHC: 33.2 g/dL (ref 30.0–36.0)
MCV: 103.3 fL — ABNORMAL HIGH (ref 80.0–100.0)
Platelets: 159 10*3/uL (ref 150–400)
RBC: 3.61 MIL/uL — ABNORMAL LOW (ref 4.22–5.81)
RDW: 16.3 % — ABNORMAL HIGH (ref 11.5–15.5)
WBC: 3.9 10*3/uL — ABNORMAL LOW (ref 4.0–10.5)
nRBC: 0 % (ref 0.0–0.2)

## 2020-12-04 LAB — URINALYSIS, ROUTINE W REFLEX MICROSCOPIC
Bilirubin Urine: NEGATIVE
Glucose, UA: NEGATIVE mg/dL
Hgb urine dipstick: NEGATIVE
Ketones, ur: 80 mg/dL — AB
Leukocytes,Ua: NEGATIVE
Nitrite: NEGATIVE
Protein, ur: NEGATIVE mg/dL
Specific Gravity, Urine: 1.019 (ref 1.005–1.030)
pH: 5 (ref 5.0–8.0)

## 2020-12-04 LAB — RENAL FUNCTION PANEL
Albumin: 2.5 g/dL — ABNORMAL LOW (ref 3.5–5.0)
Anion gap: 16 — ABNORMAL HIGH (ref 5–15)
BUN: 12 mg/dL (ref 8–23)
CO2: 19 mmol/L — ABNORMAL LOW (ref 22–32)
Calcium: 9.6 mg/dL (ref 8.9–10.3)
Chloride: 100 mmol/L (ref 98–111)
Creatinine, Ser: 0.62 mg/dL (ref 0.61–1.24)
GFR, Estimated: >60 mL/min (ref >60)
Glucose, Bld: 80 mg/dL (ref 70–99)
Phosphorus: 2.2 mg/dL — ABNORMAL LOW (ref 2.5–4.6)
Potassium: 3.3 mmol/L — ABNORMAL LOW (ref 3.5–5.1)
Sodium: 135 mmol/L (ref 135–145)

## 2020-12-04 LAB — CALCIUM, IONIZED: Calcium, Ionized, Serum: 5.1 mg/dL (ref 4.5–5.6)

## 2020-12-04 LAB — MAGNESIUM: Magnesium: 2 mg/dL (ref 1.7–2.4)

## 2020-12-04 LAB — HEPARIN LEVEL (UNFRACTIONATED): Heparin Unfractionated: 0.46 IU/mL (ref 0.30–0.70)

## 2020-12-04 LAB — STREP PNEUMONIAE URINARY ANTIGEN: Strep Pneumo Urinary Antigen: NEGATIVE

## 2020-12-04 MED ORDER — POTASSIUM CHLORIDE CRYS ER 20 MEQ PO TBCR
40.0000 meq | EXTENDED_RELEASE_TABLET | Freq: Once | ORAL | Status: AC
  Administered 2020-12-04: 40 meq via ORAL
  Filled 2020-12-04: qty 2

## 2020-12-04 MED ORDER — SODIUM CHLORIDE 0.9 % IV SOLN
2.0000 g | INTRAVENOUS | Status: DC
  Administered 2020-12-04 – 2020-12-07 (×4): 2 g via INTRAVENOUS
  Filled 2020-12-04 (×3): qty 2
  Filled 2020-12-04: qty 20
  Filled 2020-12-04: qty 2

## 2020-12-04 MED ORDER — METRONIDAZOLE 500 MG/100ML IV SOLN
500.0000 mg | Freq: Three times a day (TID) | INTRAVENOUS | Status: AC
  Administered 2020-12-04 – 2020-12-08 (×14): 500 mg via INTRAVENOUS
  Filled 2020-12-04 (×14): qty 100

## 2020-12-04 NOTE — Progress Notes (Addendum)
IP PROGRESS NOTE  Subjective:   Cody Hurley has no specific complaints.  Family friend at the bedside reports that he may be feeling a little bit stronger today.  Objective: Vital signs in last 24 hours: Blood pressure (!) 119/58, pulse 88, temperature 97.9 F (36.6 C), temperature source Oral, resp. rate 16, height 5\' 7"  (1.702 m), weight 61.4 kg, SpO2 90 %.  Intake/Output from previous day: 07/07 0701 - 07/08 0700 In: 1914 [P.O.:120; I.V.:1375] Out: -   Physical Exam:  HEENT: Dryness of the tongue and buccal mucosa, thrush noted on tongue which appears improved Lungs: Clear anteriorly Cardiac: Regular rate and rhythm Abdomen: No hepatosplenomegaly, no mass, nontender, left lower quadrant colostomy Extremities: No leg edema Neurologic: Alert, follows commands, oriented   Lab Results: Recent Labs    12/03/20 0657 12/04/20 0201  WBC 3.6* 3.9*  HGB 13.4 12.4*  HCT 39.8 37.3*  PLT 196 159    BMET Recent Labs    12/03/20 0657 12/04/20 0201  NA 135 135  K 3.6 3.3*  CL 103 100  CO2 16* 19*  GLUCOSE 63* 80  BUN 11 12  CREATININE 0.41* 0.62  CALCIUM 9.6 9.6    Lab Results  Component Value Date   CEA1 2.57 11/18/2020    Studies/Results: CT BIOPSY  Result Date: 12/03/2020 INDICATION: History of rectal cancer, now with infiltrative mass within the right retroperitoneum/pararenal space. Please perform CT-guided biopsy for tissue diagnostic purposes. EXAM: CT-GUIDED BIOPSY OF INFILTRATIVE RIGHT RETROPERITONEAL/PERIRENAL MASS COMPARISON:  CT abdomen and pelvis-11/26/2020 MEDICATIONS: None. ANESTHESIA/SEDATION: Fentanyl 100 mcg IV; Versed 0.5 mg IV Sedation time: 15 minutes; The patient was continuously monitored during the procedure by the interventional radiology nurse under my direct supervision. CONTRAST:  None. COMPLICATIONS: None immediate. PROCEDURE: Informed consent was obtained from the patient and the patient's wife following an explanation of the procedure, risks,  benefits and alternatives. A time out was performed prior to the initiation of the procedure. The patient was positioned supine, slightly LPO on the CT gantry and a limited CT was performed for procedural planning demonstrating unchanged size and appearance of the infiltrative mass within the right-side of the retroperitoneum/perirenal space with dominant ill-defined nodular component about the caudal aspect measuring at least 6.8 x 4.6 cm (image 51, series 2). The procedure was planned. The operative site was prepped and draped in the usual sterile fashion. Appropriate trajectory was confirmed with a 22 gauge spinal needle after the adjacent tissues were anesthetized with 1% Lidocaine with epinephrine. Under intermittent CT guidance, a 17 gauge coaxial needle was advanced into the peripheral aspect of the mass. Appropriate positioning was confirmed and a core needle biopsy samples were obtained with an 18 gauge core needle biopsy device. The co-axial needle was removed following administration of a Gel-Foam slurry and superficial hemostasis was achieved with manual compression. A limited postprocedural CT was negative for hemorrhage or additional complication. A dressing was placed. The patient tolerated the procedure well without immediate postprocedural complication. IMPRESSION: Technically successful CT guided core needle biopsy of infiltrative right retroperitoneal/peri renal mass. Electronically Signed   By: Sandi Mariscal M.D.   On: 12/03/2020 14:39   DG CHEST PORT 1 VIEW  Result Date: 12/03/2020 CLINICAL DATA:  87 y.o. male with history of rectal cancer last treated on 2017-experiencing increasing weakness and poor appetite. Patient was diagnosed with COVID infection about 2 weeks ago was treated with antiviral and p.o. prednisone. EXAM: PORTABLE CHEST - 1 VIEW COMPARISON:  11/22/2020 FINDINGS: Worsening right mid  lung and suprahilar interstitial opacities. Coarse peripheral bronchovascular markings  bilaterally as before. Heart size and mediastinal contours are within normal limits. Aortic Atherosclerosis (ICD10-170.0). No effusion.  No pneumothorax. Visualized bones unremarkable. IMPRESSION: Interval worsening of right mid lung and suprahilar pulmonary opacities since prior study Electronically Signed   By: Lucrezia Europe M.D.   On: 12/03/2020 11:35    Medications: I have reviewed the patient's current medications.  Assessment/Plan: Stage III rectal cancer,ypT4a,N2 status post a low anterior resection in September 2017 Presenting November 2016 with a mass at 10 cm from the anus, clinical stage II by [id number] Neoadjuvant Xeloda and radiation beginning 06/01/2015, completed 20 of 28 planned fractions, discontinued secondary to toxicity Elevated pretreatment CEA CT chest 04/10/2019-no evidence of metastatic disease, changes of interstitial lung disease, changes suggestive of cirrhosis CT chest 11/22/2020-scattered dependent groundglass airspace opacity and irregular interstitial opacity-nonspecific infectious or inflammatory, new bilateral adrenal nodules, extensive ill-defined soft tissue surrounding the right kidney suspicious for malignancy CT abdomen/pelvis 11/26/2020-bilateral adrenal masses, multinodular masslike soft tissue density throughout the right perinephric space and renal sinus, new left periaortic and retroperitoneal adenopathy, cirrhosis   Prostate cancer 1990 treated with a prostatectomy History of a TIA Carpal tunnel syndrome Palpable liver edge-ultrasound abdomen 04/10/2019-fatty infiltration of the liver, normal portal vein flow Left lung subsegmental pulmonary emboli 11/22/2020-apixaban Anorexia/weight loss COVID-19 infection June 2022 Admission 12/01/2020 with failure to thrive Elevated calcium-likely hypercalcemia of malignancy     [name] appears unchanged.  He continues to have anorexia and weakness.  I suspect he has a progressive malignancy, most likely recurrent rectal  cancer. He underwent a diagnostic biopsy by IR on 7/7.  I have called for preliminary results and left my number for return call.  We will have further discussions regarding goals of care pending these results.  If he has recurrent rectal cancer, may consider referral to hospice.  However, if biopsy confirms lymphoma, which is thought to be less likely, that he may be a candidate for treatment.  He remains on fluconazole 100 mg daily for oral candidiasis.  Calcium improving with IV hydration. Ionized calcium pending.  Hypercalcemia likely due to underlying malignancy.  We will continue to hold off on bisphosphonate therapy for now.  Recommendations: 1.  Await results of retroperitoneal biopsy.  I have called and left a message with pathology for preliminary results. 2.  Continue IV hydration and monitor calcium level closely.  We will follow-up on ionized calcium. 3.  Continue fluconazole 100 mg daily for oral candidiasis. 4.  Begin prednisone for appetite stimulation. 5.  Further discussion regarding goals of care pending biopsy results. 6.  Resume anticoagulation therapy when okay with interventional radiology 7.  Discussed placement options with patient and wife   LOS: 3 days   Cody Bussing, NP   12/04/2020, 7:44 AM Mr. Bhagat appears stable.  The calcium level remains lower.  He will begin prednisone as an appetite stimulant. The pathologist reports the biopsy is diagnostic of malignancy, poorly differentiated.  The differential diagnosis includes colorectal cancer and lymphoma.  Additional results should be available early next week.  I discussed the preliminary pathology findings with Ms. Slaydon by telephone.  Please call oncology as needed over the weekend.  I will see him on 12/07/2020  I was present for greater than 50% of today's visit.  I performed medical decision making.

## 2020-12-04 NOTE — Plan of Care (Addendum)
Pt able to comfortably sleep through the night; home help at beside; no s/s of acute distress or pain reported or observed; call light within reach and bed at lowest position for safety. UA and UC sent to lab

## 2020-12-04 NOTE — Progress Notes (Signed)
PROGRESS NOTE    Cody Hurley  FWY:[id number] DOB: 02-09-1926 DOA: 12/01/2020 PCP: [name], MD    No chief complaint on file.   Brief Narrative:  HPI per [name] is a 87 y.o. male with history of rectal cancer last treated on 2017-experiencing increasing weakness and poor appetite.  Patient was diagnosed with COVID infection about 2 weeks ago was treated with antiviral and p.o. prednisone.  Subsequent which patient also had come to the ER on June 26 with shortness of breath and at the time patient CT angiogram showed 2 small pulmonary embolism and was placed on apixaban.  The CT scan also showed retroperitoneal lesions concerning for metastatic cancer.  Patient had subsequently followed with [name] patient's oncologist who ordered a CT abdomen pelvis confirms multiple lesions and since patient has been progressively worsening and finding it difficult to ambulate has poor appetite lost at least 20 to 30 pounds in the last month.  Patient admitted for further work-up.  On exam patient appears generally weak otherwise nonfocal.  Has mild swelling of the legs.     Assessment & Plan:   Principal Problem:   Retroperitoneal mass Active Problems:   Pulmonary embolism (HCC)   FTT (failure to thrive) in adult   Hypercalcemia   History of rectal cancer   History of prostate cancer   COVID-19 virus infection: Recent COVID-19 infection status posttreatment  1 retroperitoneal renal lesions concerning for metastatic cancer -Patient seen in consultation by oncology who have consulted with IR for evaluation for biopsy which was done 12/03/2020 with results pending.  -Continue to hold apixaban and patient currently on heparin. -Per oncology, IR.  2.  Recent diagnosis of PE -Apixaban on hold for biopsy which was done this morning.   -Continue heparin for now and will need to discuss with IR when patient's apixaban can be resumed hopefully in the next 24 to 48 hours.     3.  History of rectal cancer and prostate cancer -Being followed by oncology, [name].  4.  Hypercalcemia -Likely hypercalcemia of malignancy. -Ionized calcium pending.   -Corrected calcium of 10.64.   -Phosphorus level at 2.2 which we will replete.   -Magnesium repleted currently at 2.0 . -Continue gentle hydration.  -Repeat labs in the morning and if no continued improvement may need a trial of bisphosphonates.   -Per oncology.    5.  Recent COVID-19 infection -Status posttreatment with antiviral and steroids. -Asymptomatic. -Sats of 90% on room air.   6.  Failure to thrive -Likely secondary to #1, #3. -Biopsies pending. -Random cortisol at 21.2.  -May need palliative care involvement once biopsies are finalized and if patient felt not a candidate for any further chemotherapy. -We will defer timing of palliative care involvement to oncology. -Patient started on prednisone to stimulate appetite per oncology.  7.  Acidosis -Questionable etiology. -Concern for possible CAP versus aspiration pneumonia. -Urinalysis unremarkable.   -Chest x-ray with interval worsening of right midlung and suprahilar pulmonary opacities since prior study.   -Acidosis improving on bicarb drip.   -Place empirically on IV antibiotics for probable pneumonia.   -SLP evaluation.   -Follow.    8.  Oral thrush -Patient started on fluconazole 100 mg daily per oncology. -Continue nystatin swish and swallow.  9.  Hypophosphatemia -Improving on K-Phos Neutral 250 mg p.o. twice daily. -Repeat labs in the morning.  10.??  CAP versus aspiration pneumonia -Patient noted to have developed an acidosis. -Chest x-ray  done with interval worsening of right midlung and suprahilar pulmonary opacities since prior study. -Patient afebrile -Per wife patient with some coughing episodes when drinking liquids. -Check urine Legionella antigen, urine pneumococcus antigen. -Place empirically on IV Rocephin, IV  Flagyl to cover for possible CAP versus aspiration pneumonia. -SLP evaluation.   DVT prophylaxis: Heparin Hur[place] Status: Full Family Communication: Updated patient, wife, daughter at bedside. Disposition:   Status is: Inpatient  Remains inpatient appropriate because:Inpatient level of care appropriate due to severity of illness  Dispo: The patient is from: Home              Anticipated d/c is to: Home              Patient currently is not medically stable to d/c.   Difficult to place patient No       Consultants:  Oncology: [name] IR: Dr. Laurence Ferrari 12/02/2020  Procedures:  CT-guided core needle biopsy of infiltrative right retroperitoneal/perirenal mass per Dr. Pascal Lux IR 12/03/2020 Chest x-ray 12/03/2020  Antimicrobials:  IV Rocephin 12/04/2020>>>> IV Flagyl 12/04/2020>>>>   Subjective: Sitting up eating.  Family at bedside.  Patient seems more alert today.  Denies any chest pain.  No significant shortness of breath.  Patient noted to have a cough per family.  Per family patient with some coughing when drinking liquids.    Objective: Vitals:   12/03/20 1448 12/03/20 2221 12/04/20 0406 12/04/20 0600  BP: (!) 117/55 (!) 117/50  (!) 119/58  Pulse: 86 93  88  Resp: 16 16  16   Temp: 97.7 F (36.5 C) 98 F (36.7 C)  97.9 F (36.6 C)  TempSrc: Oral Oral  Oral  SpO2: 91% (!) 88%  90%  Weight:   61.4 kg   Height:   5\' 7"  (1.702 m)     Intake/Output Summary (Last 24 hours) at 12/04/2020 1330 Last data filed at 12/04/2020 0500 Gross per 24 hour  Intake 1495 ml  Output --  Net 1495 ml    Filed Weights   12/04/20 0406  Weight: 61.4 kg    Examination:  General exam: NAD.  Oral thrush. Respiratory system: Lungs clear to auscultation bilaterally.  No wheezes, no crackles, no rhonchi.  Normal respiratory effort. Cardiovascular system: RRR no murmurs rubs or gallops.  No JVD.  No lower extremity edema.   Gastrointestinal system: Abdomen is soft, nontender, nondistended,  positive bowel sounds.  Ostomy bag intact with loose stools. Central nervous system: Alert.  No focal neurological deficits. Extremities: Symmetric 5 x 5 power. Skin: No rashes, lesions or ulcers Psychiatry: Judgement and insight appear fair. Mood & affect appropriate.     Data Reviewed: I have personally reviewed following labs and imaging studies  CBC: Recent Labs  Lab 12/01/20 1715 12/02/20 0247 12/03/20 0657 12/04/20 0201  WBC 4.8 5.6 3.6* 3.9*  NEUTROABS 3.0  --   --   --   HGB 14.8 14.2 13.4 12.4*  HCT 44.2 42.0 39.8 37.3*  MCV 102.1* 102.4* 102.8* 103.3*  PLT 192 182 196 159     Basic Metabolic Panel: Recent Labs  Lab 12/01/20 1715 12/03/20 0657 12/04/20 0201  NA 136 135 135  K 3.9 3.6 3.3*  CL 99 103 100  CO2 18* 16* 19*  GLUCOSE 75 63* 80  BUN 15 11 12   CREATININE 0.59* 0.41* 0.62  CALCIUM 11.3* 9.6 9.6  MG  --  1.7 2.0  PHOS  --  2.1* 2.2*     GFR: Estimated Creatinine  Clearance: 50.1 mL/min (by C-G formula based on SCr of 0.62 mg/dL).  Liver Function Tests: Recent Labs  Lab 12/01/20 1715 2020-12-27 0657 12/04/20 0201  AST 63* 56*  --   ALT 32 27  --   ALKPHOS 87 68  --   BILITOT 1.2 1.1  --   PROT 7.2 6.0*  --   ALBUMIN 3.3* 2.7* 2.5*     CBG: No results for input(s): GLUCAP in the last 168 hours.   No results found for this or any previous visit (from the past 240 hour(s)).       Radiology Studies: CT BIOPSY  Result Date: 12-27-2020 INDICATION: History of rectal cancer, now with infiltrative mass within the right retroperitoneum/pararenal space. Please perform CT-guided biopsy for tissue diagnostic purposes. EXAM: CT-GUIDED BIOPSY OF INFILTRATIVE RIGHT RETROPERITONEAL/PERIRENAL MASS COMPARISON:  CT abdomen and pelvis-11/26/2020 MEDICATIONS: None. ANESTHESIA/SEDATION: Fentanyl 100 mcg IV; Versed 0.5 mg IV Sedation time: 15 minutes; The patient was continuously monitored during the procedure by the interventional radiology nurse under  my direct supervision. CONTRAST:  None. COMPLICATIONS: None immediate. PROCEDURE: Informed consent was obtained from the patient and the patient's wife following an explanation of the procedure, risks, benefits and alternatives. A time out was performed prior to the initiation of the procedure. The patient was positioned supine, slightly LPO on the CT gantry and a limited CT was performed for procedural planning demonstrating unchanged size and appearance of the infiltrative mass within the right-side of the retroperitoneum/perirenal space with dominant ill-defined nodular component about the caudal aspect measuring at least 6.8 x 4.6 cm (image 51, series 2). The procedure was planned. The operative site was prepped and draped in the usual sterile fashion. Appropriate trajectory was confirmed with a 22 gauge spinal needle after the adjacent tissues were anesthetized with 1% Lidocaine with epinephrine. Under intermittent CT guidance, a 17 gauge coaxial needle was advanced into the peripheral aspect of the mass. Appropriate positioning was confirmed and a core needle biopsy samples were obtained with an 18 gauge core needle biopsy device. The co-axial needle was removed following administration of a Gel-Foam slurry and superficial hemostasis was achieved with manual compression. A limited postprocedural CT was negative for hemorrhage or additional complication. A dressing was placed. The patient tolerated the procedure well without immediate postprocedural complication. IMPRESSION: Technically successful CT guided core needle biopsy of infiltrative right retroperitoneal/peri renal mass. Electronically Signed   By: Sandi Mariscal M.D.   On: 12/27/20 14:39   DG CHEST PORT 1 VIEW  Result Date: 12/27/2020 CLINICAL DATA:  87 y.o. male with history of rectal cancer last treated on 2017-experiencing increasing weakness and poor appetite. Patient was diagnosed with COVID infection about 2 weeks ago was treated with  antiviral and p.o. prednisone. EXAM: PORTABLE CHEST - 1 VIEW COMPARISON:  11/22/2020 FINDINGS: Worsening right mid lung and suprahilar interstitial opacities. Coarse peripheral bronchovascular markings bilaterally as before. Heart size and mediastinal contours are within normal limits. Aortic Atherosclerosis (ICD10-170.0). No effusion.  No pneumothorax. Visualized bones unremarkable. IMPRESSION: Interval worsening of right mid lung and suprahilar pulmonary opacities since prior study Electronically Signed   By: Lucrezia Europe M.D.   On: 2020-12-27 11:35        Scheduled Meds:  feeding supplement  237 mL Oral BID BM   fluconazole  100 mg Oral Daily   multivitamin with minerals  1 tablet Oral Daily   nystatin  5 mL Oral QID   phosphorus  250 mg Oral BID   predniSONE  20 mg Oral Q breakfast   Continuous Infusions:  cefTRIAXone (ROCEPHIN)  IV 2 g (12/04/20 1214)   heparin 700 Units/hr (12/03/20 2250)   metronidazole 500 mg (12/04/20 1104)    sodium bicarbonate (isotonic) infusion in sterile water 125 mL/hr at 12/04/20 0641     LOS: 3 days    Time spent: 35 minutes    Irine Seal, MD [facility]   To contact the attending provider between 7A-7P or the covering provider during after hours 7P-7A, please log into the web site [email or web address] and access using universal [place] password for that web site. If you do not have the password, please call the hospital operator.  12/04/2020, 1:30 PM

## 2020-12-04 NOTE — Progress Notes (Signed)
Physical Therapy Treatment Patient Details Name: Cody Hurley MRN: [id number] DOB: September 08, 1925 Today's Date: 12/04/2020    History of Present Illness 87 yo male admitted with retroperitoneal mass, weakness, FTT. Hx of stage III rectal ca, LAR 2017, prostate ca, PE, anorexia, recent COVID    PT Comments    Assisted pt OOB to ambulate in hall. Pt was min assit to come to EOB with some cues needed for hand placement. Pt was able to complete 2 sit to stands with min assist to don void and then to don brief. Pt am walked ~140 feet with RW with no complaints of SOB or dizziness. Pt continues to present with weakness and decreased activity tolerance during ambulation with decreased gait speed and flexed trunk and lateral postural sway. Pt positioned in recliner with wife and daughter present. Pt will benefit from continued PT to improve functional mobility and increase safety before D/C.    Follow Up Recommendations  Home health PT;Supervision/Assistance - 24 hour;SNF     Equipment Recommendations  None recommended by PT    Recommendations for Other Services       Precautions / Restrictions Precautions Precautions: Fall Precaution Comments: colostomy Restrictions Weight Bearing Restrictions: No    Mobility  Bed Mobility Overal bed mobility: Needs Assistance Bed Mobility: Supine to Sit     Supine to sit: Min assist          Transfers   Equipment used: Rolling walker (2 wheeled) Transfers: Sit to/from Stand Sit to Stand: From elevated surface;Min assist         General transfer comment: min assist to power up. cues for hand placement.  Ambulation/Gait Ambulation/Gait assistance: Min guard Gait Distance (Feet): 140 Feet Assistive device: Rolling walker (2 wheeled) Gait Pattern/deviations: Step-through pattern;Decreased stride length Gait velocity: decr   General Gait Details: Cues for posture and RW proximity.  Pt tolerated distance well with no c/o of dissiness or  SOB.   Stairs             Wheelchair Mobility    Modified Rankin (Stroke Patients Only)       Balance Overall balance assessment: Needs assistance Sitting-balance support: Bilateral upper extremity supported Sitting balance-Leahy Scale: Fair     Standing balance support: Bilateral upper extremity supported Standing balance-Leahy Scale: Poor                              Cognition Arousal/Alertness: Awake/alert Behavior During Therapy: WFL for tasks assessed/performed Overall Cognitive Status: Within Functional Limits for tasks assessed                                 General Comments: very HOH with hearing aids in place.      Exercises      General Comments        Pertinent Vitals/Pain Pain Assessment: No/denies pain    Home Living                      Prior Function            PT Goals (current goals can now be found in the care plan section) Acute Rehab PT Goals Patient Stated Goal: get stronger and return home PT Goal Formulation: With patient/family Time For Goal Achievement: 12/16/20 Potential to Achieve Goals: Fair Progress towards PT goals: Progressing toward goals    Frequency  Min 3X/week      PT Plan      Co-evaluation              AM-PAC PT "6 Clicks" Mobility   Outcome Measure  Help needed turning from your back to your side while in a flat bed without using bedrails?: A Little Help needed moving from lying on your back to sitting on the side of a flat bed without using bedrails?: A Lot Help needed moving to and from a bed to a chair (including a wheelchair)?: A Little Help needed standing up from a chair using your arms (e.g., wheelchair or bedside chair)?: A Little Help needed to walk in hospital room?: A Little Help needed climbing 3-5 steps with a railing? : A Lot 6 Click Score: 16    End of Session Equipment Utilized During Treatment: Gait belt Activity Tolerance: Patient  tolerated treatment well Patient left: in chair;with call bell/phone within reach;with chair alarm set;with family/visitor present   PT Visit Diagnosis: Muscle weakness (generalized) (M62.81);Difficulty in walking, not elsewhere classified (R26.2)     Time:  -     Charges:                        Ernst Spell, PTA Student  Acute Rehabilitation Services Pager : [phone] Office : [place] 12/04/2020, 12:45 PM

## 2020-12-04 NOTE — Evaluation (Signed)
SLP Note  Patient Details Name: Cody Hurley MRN: [id number] DOB: [birth year]   Cancelled treatment:       Reason Eval/Treat Not Completed: Other (comment) (Received order for swallow evaluation - thank you.  Eval to be completed on 12/05/2020,) Kathleen Lime, MS [facility] SLP Acute Rehab Services Office [phone] Pager [phone]   Macario Golds 12/04/2020, 6:58 PM

## 2020-12-04 NOTE — Progress Notes (Signed)
[place] for IV heparin (PTA apixaban) Indication: Recent Hx PE  Allergies  Allergen Reactions   Penicillin G Rash    Patient Measurements:   Heparin Dosing Weight: TBW  Vital Signs: Temp: 98 F (36.7 C) (07/07 2221) Temp Source: Oral (07/07 2221) BP: 117/50 (07/07 2221) Pulse Rate: 93 (07/07 2221)  Labs: Recent Labs    12/01/20 1715 12/02/20 0247 12/02/20 1044 12/02/20 2023 12/03/20 0657 12/04/20 0201  HGB 14.8 14.2  --   --  13.4 12.4*  HCT 44.2 42.0  --   --  39.8 37.3*  PLT 192 182  --   --  196 159  APTT 39* 64* 123* 133* 103*  --   LABPROT  --   --  16.0*  --   --   --   INR  --   --  1.3*  --   --   --   HEPARINUNFRC >1.10*  --   --   --  0.57 0.46  CREATININE 0.59*  --   --   --  0.41* 0.62     Estimated Creatinine Clearance: 50.1 mL/min (by C-G formula based on SCr of 0.62 mg/dL).   Medical History: Past Medical History:  Diagnosis Date   Colorectal cancer ([place]) 2016   Prostate cancer ([place])     Medications:  Medications Prior to Admission  Medication Sig Dispense Refill Last Dose   acetaminophen (TYLENOL) 500 MG tablet Take 500-1,000 mg by mouth every 6 (six) hours as needed for mild pain (or headaches).   unk   APIXABAN (ELIQUIS) VTE STARTER PACK (10MG  AND 5MG ) Take as directed on package: start with two-5mg  tablets twice daily for 7 days. On day 8, switch to one-5mg  tablet twice daily. (Patient taking differently: Take 5-10 mg by mouth See admin instructions. Take 10 mg by mouth two times a day for 7 days, then decrease to 5 mg two times a day on "day 8") 1 each 0 12/01/2020 at 0730   Cholecalciferol (VITAMIN D-3) 25 MCG (1000 UT) CAPS Take 2,000 Units by mouth daily with breakfast.   12/01/2020 at am   Multiple Vitamin (MULTIVITAMIN) tablet Take 1 tablet by mouth daily.   12/01/2020   oxybutynin (DITROPAN) 5 MG tablet Take 5 mg by mouth daily.   12/01/2020 at am   vitamin B-12 (CYANOCOBALAMIN) 100 MCG tablet Take 100  mcg by mouth daily.   12/01/2020   Scheduled:   feeding supplement  237 mL Oral BID BM   fluconazole  100 mg Oral Daily   multivitamin with minerals  1 tablet Oral Daily   nystatin  5 mL Oral QID   phosphorus  250 mg Oral BID   predniSONE  20 mg Oral Q breakfast   Infusions:   heparin 700 Units/hr (12/03/20 1754)    sodium bicarbonate (isotonic) infusion in sterile water 125 mL/hr at 12/03/20 2105   Assessment: 58 yoM with PMH rectal cancer, recently started on Eliquis for PE 11/22/20, admitted directly from [facility] for inpatient workup for possible metastatic malignancy. Eliquis held for retroperitoneal/renal Bx, with Pharmacy to dose heparin in its place.  Baseline aPTT slightly elevated; baseline heparin level markedly elevated as expected with recent DOAC Prior anticoagulation: Eliquis 10 mg bid, transitioned to 5 mg BID today 7/5 with last dose at 0730  Significant events: 7/7: Bx of R peri-renal mass  Today, 12/04/2020: Heparin level 0.46 - therapeutic on heparin 700 units/hr CBC: Hgb down 12.4; pltc down  to 159 Per RN, no bleeding or issues to report  Goal of Therapy: Heparin level 0.3-0.7 units/ml Monitor platelets by anticoagulation protocol: Yes  Plan: Continue IV heparin infusion at 700 units/hr  Daily CBC and heparin level Monitor for signs of bleeding or thrombosis F/u for ability to resume Eliquis  Netta Cedars PharmD 12/04/2020 3:13 AM

## 2020-12-04 NOTE — Care Management Important Message (Signed)
Important Message  Patient Details IM Letter placed in Patient's room. Name: Cody Hurley MRN: [id number] Date of Birth: 06-18-1925   Medicare Important Message Given:  Yes     Kerin Salen 12/04/2020, 9:20 AM

## 2020-12-05 DIAGNOSIS — Z85048 Personal history of other malignant neoplasm of rectum, rectosigmoid junction, and anus: Secondary | ICD-10-CM | POA: Diagnosis not present

## 2020-12-05 DIAGNOSIS — R627 Adult failure to thrive: Secondary | ICD-10-CM | POA: Diagnosis not present

## 2020-12-05 DIAGNOSIS — U071 COVID-19: Secondary | ICD-10-CM | POA: Diagnosis not present

## 2020-12-05 DIAGNOSIS — R1900 Intra-abdominal and pelvic swelling, mass and lump, unspecified site: Secondary | ICD-10-CM | POA: Diagnosis not present

## 2020-12-05 LAB — CBC
HCT: 39.4 % (ref 39.0–52.0)
Hemoglobin: 13.3 g/dL (ref 13.0–17.0)
MCH: 34.2 pg — ABNORMAL HIGH (ref 26.0–34.0)
MCHC: 33.8 g/dL (ref 30.0–36.0)
MCV: 101.3 fL — ABNORMAL HIGH (ref 80.0–100.0)
Platelets: 179 10*3/uL (ref 150–400)
RBC: 3.89 MIL/uL — ABNORMAL LOW (ref 4.22–5.81)
RDW: 16.3 % — ABNORMAL HIGH (ref 11.5–15.5)
WBC: 4.4 10*3/uL (ref 4.0–10.5)
nRBC: 0 % (ref 0.0–0.2)

## 2020-12-05 LAB — BASIC METABOLIC PANEL
Anion gap: 18 — ABNORMAL HIGH (ref 5–15)
BUN: 15 mg/dL (ref 8–23)
CO2: 19 mmol/L — ABNORMAL LOW (ref 22–32)
Calcium: 9.6 mg/dL (ref 8.9–10.3)
Chloride: 99 mmol/L (ref 98–111)
Creatinine, Ser: 0.6 mg/dL — ABNORMAL LOW (ref 0.61–1.24)
GFR, Estimated: >60 mL/min (ref >60)
Glucose, Bld: 64 mg/dL — ABNORMAL LOW (ref 70–99)
Potassium: 3.6 mmol/L (ref 3.5–5.1)
Sodium: 136 mmol/L (ref 135–145)

## 2020-12-05 LAB — HEPARIN LEVEL (UNFRACTIONATED)
Heparin Unfractionated: 0.2 IU/mL — ABNORMAL LOW (ref 0.30–0.70)
Heparin Unfractionated: 0.33 IU/mL (ref 0.30–0.70)
Heparin Unfractionated: 0.48 IU/mL (ref 0.30–0.70)

## 2020-12-05 LAB — URINE CULTURE: Culture: NO GROWTH

## 2020-12-05 LAB — GLUCOSE, CAPILLARY
Glucose-Capillary: 82 mg/dL (ref 70–99)
Glucose-Capillary: 134 mg/dL — ABNORMAL HIGH (ref 70–99)
Glucose-Capillary: 136 mg/dL — ABNORMAL HIGH (ref 70–99)

## 2020-12-05 MED ORDER — SODIUM BICARBONATE 8.4 % IV SOLN
INTRAVENOUS | Status: DC
  Filled 2020-12-05: qty 1000
  Filled 2020-12-05 (×3): qty 150
  Filled 2020-12-05: qty 1000

## 2020-12-05 NOTE — Progress Notes (Signed)
[place] for IV heparin (PTA apixaban) Indication: Recent Hx PE  Allergies  Allergen Reactions   Penicillin G Rash    Patient Measurements: Height: 5\' 7"  (170.2 cm) Weight: 61.4 kg (135 lb 6.4 oz) IBW/kg (Calculated) : 66.1 Heparin Dosing Weight: TBW  Vital Signs: Temp: 97.9 F (36.6 C) (07/09 2037) Temp Source: Oral (07/09 2037) BP: 114/63 (07/09 2037) Pulse Rate: 90 (07/09 2037)  Labs: Recent Labs    12/03/20 0657 12/04/20 0201 12/05/20 0546 12/05/20 1500 12/05/20 2310  HGB 13.4 12.4* 13.3  --   --   HCT 39.8 37.3* 39.4  --   --   PLT 196 159 179  --   --   APTT 103*  --   --   --   --   HEPARINUNFRC 0.57 0.46 0.20* 0.33 0.48  CREATININE 0.41* 0.62 0.60*  --   --      Estimated Creatinine Clearance: 50.1 mL/min (A) (by C-G formula based on SCr of 0.6 mg/dL (L)).   Medical History: Past Medical History:  Diagnosis Date   Colorectal cancer ([place]) 2016   Prostate cancer ([place])     Medications:  Medications Prior to Admission  Medication Sig Dispense Refill Last Dose   acetaminophen (TYLENOL) 500 MG tablet Take 500-1,000 mg by mouth every 6 (six) hours as needed for mild pain (or headaches).   unk   APIXABAN (ELIQUIS) VTE STARTER PACK (10MG  AND 5MG ) Take as directed on package: start with two-5mg  tablets twice daily for 7 days. On day 8, switch to one-5mg  tablet twice daily. (Patient taking differently: Take 5-10 mg by mouth See admin instructions. Take 10 mg by mouth two times a day for 7 days, then decrease to 5 mg two times a day on "day 8") 1 each 0 12/01/2020 at 0730   Cholecalciferol (VITAMIN D-3) 25 MCG (1000 UT) CAPS Take 2,000 Units by mouth daily with breakfast.   12/01/2020 at am   Multiple Vitamin (MULTIVITAMIN) tablet Take 1 tablet by mouth daily.   12/01/2020   oxybutynin (DITROPAN) 5 MG tablet Take 5 mg by mouth daily.   12/01/2020 at am   vitamin B-12 (CYANOCOBALAMIN) 100 MCG tablet Take 100 mcg by mouth daily.    12/01/2020   Scheduled:   feeding supplement  237 mL Oral BID BM   fluconazole  100 mg Oral Daily   multivitamin with minerals  1 tablet Oral Daily   nystatin  5 mL Oral QID   predniSONE  20 mg Oral Q breakfast   Infusions:   cefTRIAXone (ROCEPHIN)  IV 2 g (12/05/20 0941)   heparin 800 Units/hr (12/05/20 3845)   metronidazole 500 mg (12/05/20 1909)   sodium bicarbonate 150 mEq in D5W infusion 100 mL/hr at 12/05/20 2210   Assessment: 22 yoM with PMH rectal cancer, recently started on Eliquis for PE 11/22/20, admitted directly from [facility] for inpatient workup for possible metastatic malignancy. Eliquis held for retroperitoneal/renal Bx, with Pharmacy to dose heparin in its place.  Baseline aPTT slightly elevated; baseline heparin level markedly elevated as expected with recent DOAC Prior anticoagulation: Eliquis 10 mg bid, transitioned to 5 mg BID today 7/5 with last dose at 0730  Significant events: 7/7: Bx of R peri-renal mass  Today, 12/05/2020: Confirmatory Heparin level @ 2310- 0.48 - therapeutic x2 on heparin 800 units/hr CBC: WNL- Hgb 13.3; pltc 79 Per RN, no bleeding or issues to report  Goal of Therapy: Heparin level 0.3-0.7 units/ml Monitor platelets  by anticoagulation protocol: Yes  Plan: Continue IV heparin infusion at 800 units/hr  Daily CBC and heparin level Monitor for signs of bleeding or thrombosis F/u for ability to resume Eliquis  Netta Cedars PharmD 12/05/2020 11:55 PM

## 2020-12-05 NOTE — Progress Notes (Signed)
PROGRESS NOTE    Cody Hurley  WRU:[id number] DOB: [birth year] DOA: 12/01/2020 PCP: [name], MD    No chief complaint on file.   Brief Narrative:  HPI per [name] is a 87 y.o. male with history of rectal cancer last treated on 2017-experiencing increasing weakness and poor appetite.  Patient was diagnosed with COVID infection about 2 weeks ago was treated with antiviral and p.o. prednisone.  Subsequent which patient also had come to the ER on June 26 with shortness of breath and at the time patient CT angiogram showed 2 small pulmonary embolism and was placed on apixaban.  The CT scan also showed retroperitoneal lesions concerning for metastatic cancer.  Patient had subsequently followed with [name] patient's oncologist who ordered a CT abdomen pelvis confirms multiple lesions and since patient has been progressively worsening and finding it difficult to ambulate has poor appetite lost at least 20 to 30 pounds in the last month.  Patient admitted for further work-up.  On exam patient appears generally weak otherwise nonfocal.  Has mild swelling of the legs.     Assessment & Plan:   Principal Problem:   Retroperitoneal mass Active Problems:   Pulmonary embolism (HCC)   FTT (failure to thrive) in adult   Hypercalcemia   History of rectal cancer   History of prostate cancer   COVID-19 virus infection: Recent COVID-19 infection status posttreatment  1 retroperitoneal renal lesions concerning for metastatic cancer -Patient seen in consultation by oncology who have consulted with IR for evaluation for biopsy which was done 12/03/2020 with results pending.  -Continue heparin for now while apixaban on hold.   -Could likely resume apixaban in the next 24 to 48 hours.   -Per oncology.    2.  Recent diagnosis of PE -Apixaban on hold for biopsy which was done.   -Continue heparin for now and likely resume apixaban in the next 24 to 48 hours.   3.  History of  rectal cancer and prostate cancer -Being followed by oncology, [name].  4.  Hypercalcemia -Likely hypercalcemia of malignancy. -Ionized calcium pending.   -Corrected calcium of 10.64.   -Phosphorus level was at 2.2 and being repleted.   -Magnesium repleted currently at 2.0 . -Continue gentle hydration.  -Repeat labs in the morning and if no continued improvement may need a trial of bisphosphonates.   -Per oncology.    5.  Recent COVID-19 infection -Status posttreatment with antiviral and steroids. -Asymptomatic. -Sats of 93% on room air.    6.  Failure to thrive -Likely secondary to #1, #3. -Biopsies pending. -Random cortisol at 21.2.  -May need palliative care involvement once biopsies are finalized and if patient felt not a candidate for any further chemotherapy. -We will defer timing of palliative care involvement to oncology. -Patient started on prednisone to stimulate appetite per oncology.  7.  Acidosis -Questionable etiology. -Concern for possible CAP versus aspiration pneumonia. -Urinalysis unremarkable.   -Chest x-ray with interval worsening of right midlung and suprahilar pulmonary opacities since prior study.   -Acidosis improving on bicarb drip.   -Continue empiric IV antibiotics.   -Follow.    8.  Oral thrush -Continue fluconazole, nystatin swish and swallow.    9.  Hypophosphatemia -Improving on K-Phos Neutral 250 mg p.o. twice daily. -Repeat labs in the morning.  10.??  CAP versus aspiration pneumonia -Patient noted to have developed an acidosis. -Chest x-ray done with interval worsening of right midlung and suprahilar pulmonary opacities  since prior study. -Patient afebrile -Per wife patient with some coughing episodes when drinking liquids. -Urine pneumococcus antigen negative.   -Urine Legionella antigen pending.   -Continue empiric IV Rocephin, IV Flagyl to cover for CAP versus aspiration pneumonia.   -SLP evaluation pending.   11.   Hypoglycemia -Patient noted to have blood glucose levels of 64 on morning labs this morning. -Likely secondary to poor oral intake. -Check CBGs every 4 hours. -Change bicarb drip to include dextrose. -Follow-up.   DVT prophylaxis: Heparin Code Status: Full Family Communication: Updated patient, wife, at bedside. Disposition:   Status is: Inpatient  Remains inpatient appropriate because:Inpatient level of care appropriate due to severity of illness  Dispo: The patient is from: Home              Anticipated d/c is to: Home              Patient currently is not medically stable to d/c.   Difficult to place patient No       Consultants:  Oncology: [name] IR: Dr. Laurence Ferrari 12/02/2020  Procedures:  CT-guided core needle biopsy of infiltrative right retroperitoneal/perirenal mass per Dr. Pascal Lux IR 12/03/2020 Chest x-ray 12/03/2020  Antimicrobials:  IV Rocephin 12/04/2020>>>> IV Flagyl 12/04/2020>>>>   Subjective: Sitting up in bed.  Wife at bedside.  Wife stated patient ate okay this morning however not eating as much for lunch.  Denies any chest pain.  Denies any significant shortness of breath.  Blood glucose levels noted in the 60s this morning.   Objective: Vitals:   12/04/20 1350 12/04/20 2113 12/05/20 0522 12/05/20 1022  BP: (!) 120/51 125/60 (!) 116/56 (!) 103/57  Pulse: 91 92 85 93  Resp: 16 16 16    Temp: 97.8 F (36.6 C) 97.6 F (36.4 C) 97.7 F (36.5 C) 98 F (36.7 C)  TempSrc: Oral Oral Oral Oral  SpO2: 93% 94% 93% 93%  Weight:      Height:        Intake/Output Summary (Last 24 hours) at 12/05/2020 1404 Last data filed at 12/05/2020 0540 Gross per 24 hour  Intake 1456.44 ml  Output 1700 ml  Net -243.56 ml    Filed Weights   12/04/20 0406  Weight: 61.4 kg    Examination:  General exam: NAD.  Oral thrush improving. Respiratory system: CTA B.  No wheezes, no crackles, no rhonchi.  Normal respiratory effort.  Cardiovascular system: Regular rate and  rhythm no murmurs rubs or gallops.  No JVD.  No lower extremity edema.   Gastrointestinal system: Abdomen is soft, nontender, nondistended, positive bowel sounds.  Ostomy bag intact with loose stool noted.  Central nervous system: Alert.  Moving extremities spontaneously.  No focal neurological deficits.   Extremities: Symmetric 5 x 5 power. Skin: No rashes, lesions or ulcers Psychiatry: Judgement and insight appear fair. Mood & affect appropriate.     Data Reviewed: I have personally reviewed following labs and imaging studies  CBC: Recent Labs  Lab 12/01/20 1715 12/02/20 0247 12/03/20 0657 12/04/20 0201 12/05/20 0546  WBC 4.8 5.6 3.6* 3.9* 4.4  NEUTROABS 3.0  --   --   --   --   HGB 14.8 14.2 13.4 12.4* 13.3  HCT 44.2 42.0 39.8 37.3* 39.4  MCV 102.1* 102.4* 102.8* 103.3* 101.3*  PLT 192 182 196 159 179     Basic Metabolic Panel: Recent Labs  Lab 12/01/20 1715 12/03/20 0657 12/04/20 0201 12/05/20 0546  NA 136 135 135 136  K  3.9 3.6 3.3* 3.6  CL 99 103 100 99  CO2 18* 16* 19* 19*  GLUCOSE 75 63* 80 64*  BUN 15 11 12 15   CREATININE 0.59* 0.41* 0.62 0.60*  CALCIUM 11.3* 9.6 9.6 9.6  MG  --  1.7 2.0  --   PHOS  --  2.1* 2.2*  --      GFR: Estimated Creatinine Clearance: 50.1 mL/min (A) (by C-G formula based on SCr of 0.6 mg/dL (L)).  Liver Function Tests: Recent Labs  Lab 12/01/20 1715 12/03/20 0657 12/04/20 0201  AST 63* 56*  --   ALT 32 27  --   ALKPHOS 87 68  --   BILITOT 1.2 1.1  --   PROT 7.2 6.0*  --   ALBUMIN 3.3* 2.7* 2.5*     CBG: Recent Labs  Lab 12/05/20 1246  GLUCAP 82     Recent Results (from the past 240 hour(s))  Culture, Urine     Status: None   Collection Time: 12/03/20  7:00 AM   Specimen: Urine, Catheterized  Result Value Ref Range Status   Specimen Description   Final    URINE, CATHETERIZED Performed at [facility], [place] [address], [place], [place] [address]    Special Requests   Final     NONE Performed at [facility], [place] [address], [place], [place] [address]    Culture   Final    NO GROWTH Performed at [facility], [place] [address], [place], [place] [address]    Report Status 12/05/2020 FINAL  Final         Radiology Studies: No results found.      Scheduled Meds:  feeding supplement  237 mL Oral BID BM   fluconazole  100 mg Oral Daily   multivitamin with minerals  1 tablet Oral Daily   nystatin  5 mL Oral QID   phosphorus  250 mg Oral BID   predniSONE  20 mg Oral Q breakfast   Continuous Infusions:  cefTRIAXone (ROCEPHIN)  IV 2 g (12/05/20 0941)   heparin 800 Units/hr (12/05/20 8891)   metronidazole 500 mg (12/05/20 1131)   sodium bicarbonate 150 mEq in D5W infusion       LOS: 4 days    Time spent: 35 minutes    Irine Seal, MD [facility]   To contact the attending provider between 7A-7P or the covering provider during after hours 7P-7A, please log into the web site [email or web address] and access using universal [place] password for that web site. If you do not have the password, please call the hospital operator.  12/05/2020, 2:04 PM

## 2020-12-05 NOTE — Progress Notes (Signed)
[place] for IV heparin (PTA apixaban) Indication: Recent Hx PE  Allergies  Allergen Reactions   Penicillin G Rash    Patient Measurements: Height: 5\' 7"  (170.2 cm) Weight: 61.4 kg (135 lb 6.4 oz) IBW/kg (Calculated) : 66.1 Heparin Dosing Weight: TBW  Vital Signs: Temp: 97.7 F (36.5 C) (07/09 0522) Temp Source: Oral (07/09 0522) BP: 116/56 (07/09 0522) Pulse Rate: 85 (07/09 0522)  Labs: Recent Labs    12/02/20 1044 12/02/20 2023 12/03/20 0657 12/03/20 0657 12/04/20 0201 12/05/20 0546  HGB  --   --  13.4   < > 12.4* 13.3  HCT  --   --  39.8  --  37.3* 39.4  PLT  --   --  196  --  159 179  APTT 123* 133* 103*  --   --   --   LABPROT 16.0*  --   --   --   --   --   INR 1.3*  --   --   --   --   --   HEPARINUNFRC  --   --  0.57  --  0.46 0.20*  CREATININE  --   --  0.41*  --  0.62 0.60*   < > = values in this interval not displayed.     Estimated Creatinine Clearance: 50.1 mL/min (A) (by C-G formula based on SCr of 0.6 mg/dL (L)).   Medical History: Past Medical History:  Diagnosis Date   Colorectal cancer ([place]) 2016   Prostate cancer ([place])     Medications:  Medications Prior to Admission  Medication Sig Dispense Refill Last Dose   acetaminophen (TYLENOL) 500 MG tablet Take 500-1,000 mg by mouth every 6 (six) hours as needed for mild pain (or headaches).   unk   APIXABAN (ELIQUIS) VTE STARTER PACK (10MG  AND 5MG ) Take as directed on package: start with two-5mg  tablets twice daily for 7 days. On day 8, switch to one-5mg  tablet twice daily. (Patient taking differently: Take 5-10 mg by mouth See admin instructions. Take 10 mg by mouth two times a day for 7 days, then decrease to 5 mg two times a day on "day 8") 1 each 0 12/01/2020 at 0730   Cholecalciferol (VITAMIN D-3) 25 MCG (1000 UT) CAPS Take 2,000 Units by mouth daily with breakfast.   12/01/2020 at am   Multiple Vitamin (MULTIVITAMIN) tablet Take 1 tablet by mouth daily.    12/01/2020   oxybutynin (DITROPAN) 5 MG tablet Take 5 mg by mouth daily.   12/01/2020 at am   vitamin B-12 (CYANOCOBALAMIN) 100 MCG tablet Take 100 mcg by mouth daily.   12/01/2020   Scheduled:   feeding supplement  237 mL Oral BID BM   fluconazole  100 mg Oral Daily   multivitamin with minerals  1 tablet Oral Daily   nystatin  5 mL Oral QID   phosphorus  250 mg Oral BID   predniSONE  20 mg Oral Q breakfast   Infusions:   cefTRIAXone (ROCEPHIN)  IV 2 g (12/04/20 1214)   heparin 700 Units/hr (12/04/20 2353)   metronidazole 500 mg (12/05/20 0153)    sodium bicarbonate (isotonic) infusion in sterile water 125 mL/hr at 12/04/20 1744   Assessment: 4 yoM with PMH rectal cancer, recently started on Eliquis for PE 11/22/20, admitted directly from [facility] for inpatient workup for possible metastatic malignancy. Eliquis held for retroperitoneal/renal Bx, with Pharmacy to dose heparin in its place.  Baseline aPTT slightly elevated;  baseline heparin level markedly elevated as expected with recent DOAC Prior anticoagulation: Eliquis 10 mg bid, transitioned to 5 mg BID today 7/5 with last dose at 0730  Significant events: 7/7: Bx of R peri-renal mass  Today, 12/05/2020: Heparin level 0.2 - now SUBtherapeutic on heparin 700 units/hr CBC: WNL- Hgb 13.3; pltc 79 Per RN, no bleeding or issues to report  Goal of Therapy: Heparin level 0.3-0.7 units/ml Monitor platelets by anticoagulation protocol: Yes  Plan: Increase IV heparin infusion to 800 units/hr  Check heparin level in 8hrs Daily CBC and heparin level Monitor for signs of bleeding or thrombosis F/u for ability to resume Eliquis  Netta Cedars PharmD 12/05/2020 6:40 AM

## 2020-12-05 NOTE — Progress Notes (Signed)
ANTICOAGULATION CONSULT - Brief Note  Pharmacy Consult for IV heparin (PTA apixaban) Indication: Recent Hx PE  Patient Measurements: Height: 5\' 7"  (170.2 cm) Weight: 61.4 kg (135 lb 6.4 oz) IBW/kg (Calculated) : 66.1 Heparin Dosing Weight: TBW  Medications: Infusions:   cefTRIAXone (ROCEPHIN)  IV 2 g (12/05/20 0941)   heparin 800 Units/hr (12/05/20 4996)   metronidazole 500 mg (12/05/20 1131)   sodium bicarbonate 150 mEq in D5W infusion 100 mL/hr at 12/05/20 1535   Assessment: 49 yoM with PMH rectal cancer, recently started on Eliquis for PE 11/22/20, admitted directly from [facility] for inpatient workup for possible metastatic malignancy. Eliquis held for retroperitoneal/renal Bx, with Pharmacy to dose heparin in its place.  Heparin level increased to therapeutic range, 0.33 on heparin 800 units/hr No bleeding or complications reported.  Goal of Therapy: Heparin level 0.3-0.7 units/ml Monitor platelets by anticoagulation protocol: Yes  Plan: Continue IV heparin infusion 800 units/hr  Re-Check heparin level in 8hrs Daily CBC and heparin level Monitor for signs of bleeding or thrombosis F/u for ability to resume Eliquis   Gretta Arab PharmD, BCPS Clinical Pharmacist [facility] main pharmacy [phone] 12/05/2020 3:39 PM

## 2020-12-05 NOTE — Evaluation (Signed)
Clinical/Bedside Swallow Evaluation Patient Details  Name: Cody Hurley MRN: [id number] Date of Birth: 10-Feb-1926  Today's Date: 12/05/2020 Time: SLP Start Time (ACUTE ONLY): 3810 SLP Stop Time (ACUTE ONLY): 1450 SLP Time Calculation (min) (ACUTE ONLY): 35 min  Past Medical History:  Past Medical History:  Diagnosis Date   Colorectal cancer ([place]) 2016   Prostate cancer [facility])    Past Surgical History:  Past Surgical History:  Procedure Laterality Date   COLOSTOMY     MELANOMA EXCISION     ear   PROSTATE SURGERY  1991   HPI:  Cody Hurley is a 87 y.o. male with history of rectal cancer last treated on 2017-experiencing increasing weakness and poor appetite.  Patient was diagnosed with COVID infection about 2 weeks ago was treated with antiviral and p.o. prednisone.  Subsequent which patient also had come to the ER on June 26 with shortness of breath and at the time patient CT angiogram showed 2 small pulmonary embolism and was placed on apixaban.  The CT scan also showed retroperitoneal lesions concerning for metastatic cancer.  Patient had subsequently followed with [name] patient's oncologist who ordered a CT abdomen pelvis confirms multiple lesions and since patient has been progressively worsening and finding it difficult to ambulate has poor appetite lost at least 20 to 30 pounds in the last month.  Patient admitted for further work-up.  On exam patient appears generally weak otherwise nonfocal.  Has mild swelling of the legs. Chest x-ray with interval worsening of right midlung and suprahilar pulmonary opacities since prior study, concern for possible CAP vs aspiration PNA.   Assessment / Plan / Recommendation Clinical Impression  Patient presents with a suspected primary esophageal dysphagia characterized by c/o globus with pills and dry texture solids, multiple swallows per bite as if clearing pharyngeal and/or upper esophageal residue, intermittent throat clearing, and  occassional coughing, primarily with dry texture solids. Patient complains of frequently coughing up mucous and reports a h/o esophageal stenosis with dilation in the past. Although he reports general decrease in appetite, highly suspect that dysphagia is contributing to discomfort with swallow and poor po intake. Recent dx of oral thrush may also be co ntributing to an exacerbation of baseline deficits. Discussed in length with patent and spouse, including pros and cons of proceeding with instrumental testing in light of entire medical picture. Ultimately, family in agreement with proceeding with instrumental testing for diagnostic purposes, better visualizing swallowing physiology in order to then make decisions on patient's diet to decrease risk of aspiration risk and maximize po intake as this will be very important if patient is in fact a candidate for cancer treatment (TBD monday with oncology MD). Will proceed with MBS either next date or 7/11. SLP Visit Diagnosis: Dysphagia, pharyngoesophageal phase (R13.14)    Aspiration Risk  Moderate aspiration risk    Diet Recommendation Dysphagia 3 (Mech soft);Thin liquid   Liquid Administration via: Cup;Straw Medication Administration: Crushed with puree Supervision: Patient able to self feed;Intermittent supervision to cue for compensatory strategies Compensations: Slow rate;Small sips/bites;Follow solids with liquid Postural Changes: Seated upright at 90 degrees;Remain upright for at least 30 minutes after po intake    Other  Recommendations Oral Care Recommendations: Oral care BID   Follow up Recommendations None        Swallow Study   General HPI: Cody Hurley is a 87 y.o. male with history of rectal cancer last treated on 2017-experiencing increasing weakness and poor appetite.  Patient was diagnosed with COVID infection  about 2 weeks ago was treated with antiviral and p.o. prednisone.  Subsequent which patient also had come to the ER on June  26 with shortness of breath and at the time patient CT angiogram showed 2 small pulmonary embolism and was placed on apixaban.  The CT scan also showed retroperitoneal lesions concerning for metastatic cancer.  Patient had subsequently followed with [name] patient's oncologist who ordered a CT abdomen pelvis confirms multiple lesions and since patient has been progressively worsening and finding it difficult to ambulate has poor appetite lost at least 20 to 30 pounds in the last month.  Patient admitted for further work-up.  On exam patient appears generally weak otherwise nonfocal.  Has mild swelling of the legs. Chest x-ray with interval worsening of right midlung and suprahilar pulmonary opacities since prior study, concern for possible CAP vs aspiration PNA. Type of Study: Bedside Swallow Evaluation Previous Swallow Assessment: none Diet Prior to this Study: Regular;Thin liquids Temperature Spikes Noted: No Respiratory Status: Room air History of Recent Intubation: No Behavior/Cognition: Alert;Cooperative;Pleasant mood (HOH) Oral Cavity Assessment: Dry Oral Care Completed by SLP: Recent completion by staff Oral Cavity - Dentition: Adequate natural dentition Vision: Functional for self-feeding Self-Feeding Abilities: Able to feed self Patient Positioning: Upright in bed Baseline Vocal Quality: Normal Volitional Cough: Strong Volitional Swallow: Able to elicit    Oral/Motor/Sensory Function Overall Oral Motor/Sensory Function: Within functional limits   Ice Chips Ice chips: Not tested   Thin Liquid Thin Liquid: Impaired Presentation: Cup;Straw;Self Fed Pharyngeal  Phase Impairments: Multiple swallows    Nectar Thick Nectar Thick Liquid: Not tested   Honey Thick Honey Thick Liquid: Not tested   Puree Puree: Impaired Presentation: Spoon;Self Fed Pharyngeal Phase Impairments: Multiple swallows;Throat Clearing - Immediate   Solid     Solid: Impaired Presentation: Self Fed Oral  Phase Functional Implications: Impaired mastication (takes extremely small bites, lots of chewing) Pharyngeal Phase Impairments: Multiple swallows;Cough - Delayed;Throat Clearing - Delayed     Cody Hurley, Cody Hurley  Asheley Hellberg Meryl 12/05/2020,3:00 PM

## 2020-12-06 ENCOUNTER — Inpatient Hospital Stay (HOSPITAL_COMMUNITY): Payer: Medicare Other

## 2020-12-06 ENCOUNTER — Encounter (HOSPITAL_COMMUNITY): Payer: Self-pay | Admitting: Internal Medicine

## 2020-12-06 DIAGNOSIS — E872 Acidosis, unspecified: Secondary | ICD-10-CM

## 2020-12-06 DIAGNOSIS — U071 COVID-19: Secondary | ICD-10-CM | POA: Diagnosis not present

## 2020-12-06 DIAGNOSIS — R627 Adult failure to thrive: Secondary | ICD-10-CM | POA: Diagnosis not present

## 2020-12-06 DIAGNOSIS — R1900 Intra-abdominal and pelvic swelling, mass and lump, unspecified site: Secondary | ICD-10-CM | POA: Diagnosis not present

## 2020-12-06 LAB — RENAL FUNCTION PANEL
Albumin: 2.5 g/dL — ABNORMAL LOW (ref 3.5–5.0)
Anion gap: 19 — ABNORMAL HIGH (ref 5–15)
BUN: 13 mg/dL (ref 8–23)
CO2: 17 mmol/L — ABNORMAL LOW (ref 22–32)
Calcium: 9.8 mg/dL (ref 8.9–10.3)
Chloride: 100 mmol/L (ref 98–111)
Creatinine, Ser: 0.66 mg/dL (ref 0.61–1.24)
GFR, Estimated: >60 mL/min (ref >60)
Glucose, Bld: 96 mg/dL (ref 70–99)
Phosphorus: 2.4 mg/dL — ABNORMAL LOW (ref 2.5–4.6)
Potassium: 3.3 mmol/L — ABNORMAL LOW (ref 3.5–5.1)
Sodium: 136 mmol/L (ref 135–145)

## 2020-12-06 LAB — HEPARIN LEVEL (UNFRACTIONATED): Heparin Unfractionated: 0.42 IU/mL (ref 0.30–0.70)

## 2020-12-06 LAB — CBC
HCT: 39.9 % (ref 39.0–52.0)
Hemoglobin: 13.2 g/dL (ref 13.0–17.0)
MCH: 34.1 pg — ABNORMAL HIGH (ref 26.0–34.0)
MCHC: 33.1 g/dL (ref 30.0–36.0)
MCV: 103.1 fL — ABNORMAL HIGH (ref 80.0–100.0)
Platelets: 184 10*3/uL (ref 150–400)
RBC: 3.87 MIL/uL — ABNORMAL LOW (ref 4.22–5.81)
RDW: 17 % — ABNORMAL HIGH (ref 11.5–15.5)
WBC: 4.9 10*3/uL (ref 4.0–10.5)
nRBC: 0 % (ref 0.0–0.2)

## 2020-12-06 LAB — GLUCOSE, CAPILLARY
Glucose-Capillary: 111 mg/dL — ABNORMAL HIGH (ref 70–99)
Glucose-Capillary: 116 mg/dL — ABNORMAL HIGH (ref 70–99)
Glucose-Capillary: 124 mg/dL — ABNORMAL HIGH (ref 70–99)
Glucose-Capillary: 136 mg/dL — ABNORMAL HIGH (ref 70–99)
Glucose-Capillary: 80 mg/dL (ref 70–99)
Glucose-Capillary: 94 mg/dL (ref 70–99)

## 2020-12-06 LAB — MAGNESIUM: Magnesium: 2 mg/dL (ref 1.7–2.4)

## 2020-12-06 LAB — LACTIC ACID, PLASMA
Lactic Acid, Venous: >11 mmol/L (ref 0.5–1.9)
Lactic Acid, Venous: >11 mmol/L (ref 0.5–1.9)
Lactic Acid, Venous: >11 mmol/L (ref 0.5–1.9)

## 2020-12-06 MED ORDER — POTASSIUM CHLORIDE CRYS ER 20 MEQ PO TBCR
40.0000 meq | EXTENDED_RELEASE_TABLET | Freq: Once | ORAL | Status: AC
  Administered 2020-12-06: 40 meq via ORAL
  Filled 2020-12-06: qty 2

## 2020-12-06 MED ORDER — SODIUM CHLORIDE 0.9 % IV BOLUS
500.0000 mL | Freq: Once | INTRAVENOUS | Status: AC
  Administered 2020-12-06: 500 mL via INTRAVENOUS

## 2020-12-06 MED ORDER — IOHEXOL 9 MG/ML PO SOLN
500.0000 mL | ORAL | Status: AC
  Administered 2020-12-06 (×2): 500 mL via ORAL

## 2020-12-06 MED ORDER — K PHOS MONO-SOD PHOS DI & MONO 155-852-130 MG PO TABS
250.0000 mg | ORAL_TABLET | Freq: Two times a day (BID) | ORAL | Status: AC
  Administered 2020-12-06 – 2020-12-08 (×6): 250 mg via ORAL
  Filled 2020-12-06 (×6): qty 1

## 2020-12-06 MED ORDER — IOHEXOL 300 MG/ML  SOLN
100.0000 mL | Freq: Once | INTRAMUSCULAR | Status: AC | PRN
  Administered 2020-12-06: 100 mL via INTRAVENOUS

## 2020-12-06 MED ORDER — VANCOMYCIN HCL 1250 MG/250ML IV SOLN
1250.0000 mg | Freq: Once | INTRAVENOUS | Status: AC
  Administered 2020-12-07: 1250 mg via INTRAVENOUS
  Filled 2020-12-06: qty 250

## 2020-12-06 MED ORDER — SODIUM CHLORIDE (PF) 0.9 % IJ SOLN
INTRAMUSCULAR | Status: AC
  Filled 2020-12-06: qty 50

## 2020-12-06 MED ORDER — VANCOMYCIN HCL 750 MG/150ML IV SOLN: 750.0000 mg | INTRAVENOUS | Status: DC

## 2020-12-06 NOTE — Progress Notes (Addendum)
PROGRESS NOTE    Estelle Skibicki  WCH:[id number] DOB: Mar 21, 1926 DOA: 12/01/2020 PCP: [name], MD    No chief complaint on file.   Brief Narrative:  HPI per [name] is a 87 y.o. male with history of rectal cancer last treated on 2017-experiencing increasing weakness and poor appetite.  Patient was diagnosed with COVID infection about 2 weeks ago was treated with antiviral and p.o. prednisone.  Subsequent which patient also had come to the ER on June 26 with shortness of breath and at the time patient CT angiogram showed 2 small pulmonary embolism and was placed on apixaban.  The CT scan also showed retroperitoneal lesions concerning for metastatic cancer.  Patient had subsequently followed with [name] patient's oncologist who ordered a CT abdomen pelvis confirms multiple lesions and since patient has been progressively worsening and finding it difficult to ambulate has poor appetite lost at least 20 to 30 pounds in the last month.  Patient admitted for further work-up.  On exam patient appears generally weak otherwise nonfocal.  Has mild swelling of the legs.     Assessment & Plan:   Principal Problem:   Retroperitoneal mass Active Problems:   Pulmonary embolism (HCC)   FTT (failure to thrive) in adult   Hypercalcemia   History of rectal cancer   History of prostate cancer   COVID-19 virus infection: Recent COVID-19 infection status posttreatment  1 retroperitoneal renal lesions concerning for metastatic cancer -Patient seen in consultation by oncology who have consulted with IR for evaluation for biopsy which was done 12/03/2020 with results pending.  -Continue heparin for now while apixaban on hold.   -Could likely resume apixaban in the next 24 to 48 hours pending further evaluation of lactic acidosis..   -Per oncology.    2.  Recent diagnosis of PE -Apixaban on hold for biopsy which was done.   -Continue heparin for now and likely resume apixaban  in the next 24 to 48 hours pending evaluation of lactic acidosis..   3.  History of rectal cancer and prostate cancer -Being followed by oncology, [name].  4.  Hypercalcemia -Likely hypercalcemia of malignancy. -Ionized calcium pending.   -Corrected calcium of 10.64.   -Phosphorus level was at 2.4 and being repleted.   -Magnesium repleted currently at 2.0 . -Continue gentle hydration.  -Repeat labs in the morning and if no continued improvement may need a trial of bisphosphonates.   -Per oncology.    5.  Recent COVID-19 infection -Status posttreatment with antiviral and steroids. -Asymptomatic. -Sats of 96% on room air.    6.  Failure to thrive -Likely secondary to #1, #3. -Biopsies pending. -Random cortisol at 21.2.  -May need palliative care involvement once biopsies are finalized and if patient felt not a candidate for any further chemotherapy. -We will defer timing of palliative care involvement to oncology. -Patient started on prednisone to stimulate appetite per oncology.  7.  Acidosis/lactic acidosis -Questionable etiology. -Concern for possible CAP versus aspiration pneumonia. -Urinalysis unremarkable.   -Chest x-ray with interval worsening of right midlung and suprahilar pulmonary opacities since prior study.   -Acidosis fluctuating currently at 17 despite sodium bicarb drip.  -Patient afebrile.  No abdominal pain.  No chest pain.  No significant shortness of breath.   -Lactic acid ordered this morning > 11. -Patient not toxic looking. -Check a CT abdomen and pelvis.   -Check blood cultures x2. -Continue empiric IV Rocephin, IV Flagyl.   -Broaden antibiotic coverage  with addition of IV vancomycin. -Normal saline 500 cc bolus x1. -Follow.    8.  Oral thrush -Continue fluconazole, nystatin swish and swallow.  9.  Hypophosphatemia -Improving on K-Phos Neutral 250 mg p.o. twice daily. -Repeat labs in the morning.  10.??  CAP versus aspiration  pneumonia -Patient noted to have developed an acidosis. -Chest x-ray done with interval worsening of right midlung and suprahilar pulmonary opacities since prior study. -Patient afebrile -Per wife patient with some coughing episodes when drinking liquids. -Urine pneumococcus antigen negative.   -Urine Legionella antigen pending.   -Patient seen by speech therapy and currently on a dysphagia 3 diet.   -Continue IV Flagyl, IV Rocephin to cover for CAP versus aspiration pneumonia.   -May need modified barium swallow.   11.  Hypoglycemia -Patient noted to have blood glucose levels of 64 the morning of 12/05/2020. -Likely secondary to poor oral intake. -Check CBGs every 4 hours. -CBG 80 this morning.   -Continue bicarb drip with dextrose fluid.   -Change CBGs to every 6 hours.   -Follow.     DVT prophylaxis: Heparin Code Status: Full Family Communication: Updated patient, wife, at bedside. Disposition:   Status is: Inpatient  Remains inpatient appropriate because:Inpatient level of care appropriate due to severity of illness  Dispo: The patient is from: Home              Anticipated d/c is to: Home              Patient currently is not medically stable to d/c.   Difficult to place patient No       Consultants:  Oncology: [name] IR: Dr. Laurence Ferrari 12/02/2020  Procedures:  CT-guided core needle biopsy of infiltrative right retroperitoneal/perirenal mass per Dr. Pascal Lux IR 12/03/2020 Chest x-ray 12/03/2020 CT abdomen and pelvis pending 12/06/2020  Antimicrobials:  IV Rocephin 12/04/2020>>>>  IV Flagyl 12/04/2020>>>> IV vancomycin 12/06/2020>>>>   Subjective: Patient standing up getting changed.  States he feels well.  Denies any chest pain.  No significant shortness of breath.  Denies any abdominal pain.  Stated he ate well this morning with eggs and appetite seems to have improved today.  Wife at bedside.    Objective: Vitals:   12/05/20 0522 12/05/20 1022 12/05/20 2037  12/06/20 0423  BP: (!) 116/56 (!) 103/57 114/63 (!) 109/54  Pulse: 85 93 90 87  Resp: 16  16 12   Temp: 97.7 F (36.5 C) 98 F (36.7 C) 97.9 F (36.6 C) 97.6 F (36.4 C)  TempSrc: Oral Oral Oral Oral  SpO2: 93% 93% 96% 94%  Weight:      Height:        Intake/Output Summary (Last 24 hours) at 12/06/2020 1155 Last data filed at 12/06/2020 1000 Gross per 24 hour  Intake 2087.74 ml  Output 1200 ml  Net 887.74 ml    Filed Weights   12/04/20 0406  Weight: 61.4 kg    Examination:  General exam: NAD.  Thrush improving. Respiratory system: Lungs clear to auscultation bilaterally.  No wheezes, no crackles, no rhonchi.  Normal respiratory effort.  Cardiovascular system: RRR no murmurs rubs or gallops.  No JVD.  No lower extremity edema.  Gastrointestinal system: Abdomen is soft, nontender, nondistended, positive bowel sounds.  Ostomy bag intact with loose stool.  Central nervous system: Alert.  Moving extremities spontaneously.  No focal neurological deficits.   Extremities: Symmetric 5 x 5 power. Skin: No rashes, lesions or ulcers Psychiatry: Judgement and insight appear fair. Mood &  affect appropriate.     Data Reviewed: I have personally reviewed following labs and imaging studies  CBC: Recent Labs  Lab 12/01/20 1715 12/02/20 0247 12/03/20 0657 12/04/20 0201 12/05/20 0546 12/06/20 0558  WBC 4.8 5.6 3.6* 3.9* 4.4 4.9  NEUTROABS 3.0  --   --   --   --   --   HGB 14.8 14.2 13.4 12.4* 13.3 13.2  HCT 44.2 42.0 39.8 37.3* 39.4 39.9  MCV 102.1* 102.4* 102.8* 103.3* 101.3* 103.1*  PLT 192 182 196 159 179 184     Basic Metabolic Panel: Recent Labs  Lab 12/01/20 1715 12/03/20 0657 12/04/20 0201 12/05/20 0546 12/06/20 0558  NA 136 135 135 136 136  K 3.9 3.6 3.3* 3.6 3.3*  CL 99 103 100 99 100  CO2 18* 16* 19* 19* 17*  GLUCOSE 75 63* 80 64* 96  BUN 15 11 12 15 13   CREATININE 0.59* 0.41* 0.62 0.60* 0.66  CALCIUM 11.3* 9.6 9.6 9.6 9.8  MG  --  1.7 2.0  --  2.0   PHOS  --  2.1* 2.2*  --  2.4*     GFR: Estimated Creatinine Clearance: 50.1 mL/min (by C-G formula based on SCr of 0.66 mg/dL).  Liver Function Tests: Recent Labs  Lab 12/01/20 1715 12/03/20 0657 12/04/20 0201 12/06/20 0558  AST 63* 56*  --   --   ALT 32 27  --   --   ALKPHOS 87 68  --   --   BILITOT 1.2 1.1  --   --   PROT 7.2 6.0*  --   --   ALBUMIN 3.3* 2.7* 2.5* 2.5*     CBG: Recent Labs  Lab 12/05/20 1246 12/05/20 1704 12/05/20 2034 12/05/20 2358 12/06/20 0420  GLUCAP 82 134* 136* 111* 94      Recent Results (from the past 240 hour(s))  Culture, Urine     Status: None   Collection Time: 12/03/20  7:00 AM   Specimen: Urine, Catheterized  Result Value Ref Range Status   Specimen Description   Final    URINE, CATHETERIZED Performed at [facility], [place] [address], [place], [place] [address]    Special Requests   Final    NONE Performed at [facility], [place] [address], [place], [place] [address]    Culture   Final    NO GROWTH Performed at [facility], [place] [address], [place],  [address]    Report Status 12/05/2020 FINAL  Final         Radiology Studies: No results found.      Scheduled Meds:  feeding supplement  237 mL Oral BID BM   fluconazole  100 mg Oral Daily   multivitamin with minerals  1 tablet Oral Daily   nystatin  5 mL Oral QID   phosphorus  250 mg Oral BID   predniSONE  20 mg Oral Q breakfast   Continuous Infusions:  cefTRIAXone (ROCEPHIN)  IV 2 g (12/05/20 0941)   heparin 800 Units/hr (12/05/20 0947)   metronidazole 500 mg (12/06/20 1128)   sodium bicarbonate 150 mEq in D5W infusion 100 mL/hr at 12/06/20 0622     LOS: 5 days    Time spent: 35 minutes    Irine Seal, MD [facility]   To contact the attending provider between 7A-7P or the covering provider during after hours 7P-7A, please log into the web site [email or web address] and access using  universal [place]  password for that web site. If you do not have the password, please call the hospital operator.  12/06/2020, 11:55 AM

## 2020-12-06 NOTE — Progress Notes (Signed)
[place] for IV heparin (PTA apixaban) Indication: Recent Hx PE  Allergies  Allergen Reactions   Penicillin G Rash    Patient Measurements: Height: 5\' 7"  (170.2 cm) Weight: 61.4 kg (135 lb 6.4 oz) IBW/kg (Calculated) : 66.1 Heparin Dosing Weight: TBW  Vital Signs: Temp: 97.6 F (36.4 C) (07/10 0423) Temp Source: Oral (07/10 0423) BP: 109/54 (07/10 0423) Pulse Rate: 87 (07/10 0423)  Labs: Recent Labs    12/04/20 0201 12/05/20 0546 12/05/20 1500 12/05/20 2310 12/06/20 0558  HGB 12.4* 13.3  --   --  13.2  HCT 37.3* 39.4  --   --  39.9  PLT 159 179  --   --  184  HEPARINUNFRC 0.46 0.20* 0.33 0.48 0.42  CREATININE 0.62 0.60*  --   --  0.66     Estimated Creatinine Clearance: 50.1 mL/min (by C-G formula based on SCr of 0.66 mg/dL).   Medical History: Past Medical History:  Diagnosis Date   Colorectal cancer ([place]) 2016   Prostate cancer ([place])     Medications:  Medications Prior to Admission  Medication Sig Dispense Refill Last Dose   acetaminophen (TYLENOL) 500 MG tablet Take 500-1,000 mg by mouth every 6 (six) hours as needed for mild pain (or headaches).   unk   APIXABAN (ELIQUIS) VTE STARTER PACK (10MG  AND 5MG ) Take as directed on package: start with two-5mg  tablets twice daily for 7 days. On day 8, switch to one-5mg  tablet twice daily. (Patient taking differently: Take 5-10 mg by mouth See admin instructions. Take 10 mg by mouth two times a day for 7 days, then decrease to 5 mg two times a day on "day 8") 1 each 0 12/01/2020 at 0730   Cholecalciferol (VITAMIN D-3) 25 MCG (1000 UT) CAPS Take 2,000 Units by mouth daily with breakfast.   12/01/2020 at am   Multiple Vitamin (MULTIVITAMIN) tablet Take 1 tablet by mouth daily.   12/01/2020   oxybutynin (DITROPAN) 5 MG tablet Take 5 mg by mouth daily.   12/01/2020 at am   vitamin B-12 (CYANOCOBALAMIN) 100 MCG tablet Take 100 mcg by mouth daily.   12/01/2020   Scheduled:   feeding  supplement  237 mL Oral BID BM   fluconazole  100 mg Oral Daily   multivitamin with minerals  1 tablet Oral Daily   nystatin  5 mL Oral QID   predniSONE  20 mg Oral Q breakfast   Infusions:   cefTRIAXone (ROCEPHIN)  IV 2 g (12/05/20 0941)   heparin 800 Units/hr (12/05/20 3875)   metronidazole 500 mg (12/06/20 0139)   sodium bicarbonate 150 mEq in D5W infusion 100 mL/hr at 12/06/20 6433   Assessment: 36 yoM with PMH rectal cancer, recently started on Eliquis for PE 11/22/20, admitted directly from [facility] for inpatient workup for possible metastatic malignancy. Eliquis held for retroperitoneal/renal Bx, with Pharmacy to dose heparin in its place.  Baseline aPTT slightly elevated; baseline heparin level markedly elevated as expected with recent DOAC Prior anticoagulation: Eliquis 10 mg bid, transitioned to 5 mg BID today 7/5 with last dose at 0730  Significant events: 7/7: Bx of R peri-renal mass  Today, 12/06/2020: Daily Heparin level therapeutic on 800 units/hr CBC: stable WNL Per RN, no bleeding or issues to report  Goal of Therapy: Heparin level 0.3-0.7 units/ml Monitor platelets by anticoagulation protocol: Yes  Plan: Continue IV heparin infusion at 800 units/hr  Daily CBC and heparin level Monitor for signs of bleeding or thrombosis  F/u for ability to resume Eliquis  Cody Hurley A PharmD 12/06/2020 7:25 AM

## 2020-12-06 NOTE — Progress Notes (Signed)
Pharmacy Antibiotic Note  Cody Hurley is a 87 y.o. male admitted on 12/01/2020 with ShOB, found PE.  Pharmacy has been consulted for Vancomycin dosing.  Plan: Vancomycin 1250mg  x1, then 750mg  q24 Daily SCr  Height: 5\' 7"  (170.2 cm) Weight: 61.4 kg (135 lb 6.4 oz) IBW/kg (Calculated) : 66.1  Temp (24hrs), Avg:97.9 F (36.6 C), Min:97.6 F (36.4 C), Max:98.2 F (36.8 C)  Recent Labs  Lab 12/01/20 1715 12/02/20 0247 12/03/20 0657 12/04/20 0201 12/05/20 0546 12/06/20 0558 12/06/20 1029 12/06/20 1342 12/06/20 1615  WBC 4.8 5.6 3.6* 3.9* 4.4 4.9  --   --   --   CREATININE 0.59*  --  0.41* 0.62 0.60* 0.66  --   --   --   LATICACIDVEN  --   --   --   --   --   --  >11.0* >11.0* >11.0*    Estimated Creatinine Clearance: 50.1 mL/min (by C-G formula based on SCr of 0.66 mg/dL).    Allergies  Allergen Reactions   Penicillin G Rash    Antimicrobials this admission: 7/10 Vanc >>   Dose adjustments this admission:  Microbiology results: 7/10 BCx: sent 7/7 UCx: ng-final    Thank you for allowing pharmacy to be a part of this patient's care.  Minda Ditto PharmD 12/06/2020 9:11 PM

## 2020-12-06 NOTE — Plan of Care (Signed)
  Problem: Clinical Measurements: Goal: Will remain free from infection Outcome: Progressing Goal: Respiratory complications will improve Outcome: Progressing Goal: Cardiovascular complication will be avoided Outcome: Progressing   

## 2020-12-07 ENCOUNTER — Inpatient Hospital Stay (HOSPITAL_COMMUNITY): Payer: Medicare Other

## 2020-12-07 DIAGNOSIS — E872 Acidosis: Secondary | ICD-10-CM | POA: Diagnosis not present

## 2020-12-07 DIAGNOSIS — R1900 Intra-abdominal and pelvic swelling, mass and lump, unspecified site: Secondary | ICD-10-CM | POA: Diagnosis not present

## 2020-12-07 DIAGNOSIS — R627 Adult failure to thrive: Secondary | ICD-10-CM | POA: Diagnosis not present

## 2020-12-07 DIAGNOSIS — U071 COVID-19: Secondary | ICD-10-CM | POA: Diagnosis not present

## 2020-12-07 LAB — HEPARIN LEVEL (UNFRACTIONATED): Heparin Unfractionated: 0.37 IU/mL (ref 0.30–0.70)

## 2020-12-07 LAB — URINALYSIS, ROUTINE W REFLEX MICROSCOPIC
Bilirubin Urine: NEGATIVE
Glucose, UA: NEGATIVE mg/dL
Hgb urine dipstick: NEGATIVE
Ketones, ur: 20 mg/dL — AB
Leukocytes,Ua: NEGATIVE
Nitrite: NEGATIVE
Protein, ur: NEGATIVE mg/dL
Specific Gravity, Urine: 1.021 (ref 1.005–1.030)
pH: 5 (ref 5.0–8.0)

## 2020-12-07 LAB — CBC
HCT: 39.2 % (ref 39.0–52.0)
Hemoglobin: 13.1 g/dL (ref 13.0–17.0)
MCH: 34.1 pg — ABNORMAL HIGH (ref 26.0–34.0)
MCHC: 33.4 g/dL (ref 30.0–36.0)
MCV: 102.1 fL — ABNORMAL HIGH (ref 80.0–100.0)
Platelets: 185 10*3/uL (ref 150–400)
RBC: 3.84 MIL/uL — ABNORMAL LOW (ref 4.22–5.81)
RDW: 16.9 % — ABNORMAL HIGH (ref 11.5–15.5)
WBC: 5.7 10*3/uL (ref 4.0–10.5)
nRBC: 0 % (ref 0.0–0.2)

## 2020-12-07 LAB — COMPREHENSIVE METABOLIC PANEL
ALT: 33 U/L (ref 0–44)
AST: 65 U/L — ABNORMAL HIGH (ref 15–41)
Albumin: 2.4 g/dL — ABNORMAL LOW (ref 3.5–5.0)
Alkaline Phosphatase: 69 U/L (ref 38–126)
Anion gap: 19 — ABNORMAL HIGH (ref 5–15)
BUN: 11 mg/dL (ref 8–23)
CO2: 16 mmol/L — ABNORMAL LOW (ref 22–32)
Calcium: 9.3 mg/dL (ref 8.9–10.3)
Chloride: 100 mmol/L (ref 98–111)
Creatinine, Ser: 0.54 mg/dL — ABNORMAL LOW (ref 0.61–1.24)
GFR, Estimated: >60 mL/min (ref >60)
Glucose, Bld: 70 mg/dL (ref 70–99)
Potassium: 3.3 mmol/L — ABNORMAL LOW (ref 3.5–5.1)
Sodium: 135 mmol/L (ref 135–145)
Total Bilirubin: 0.7 mg/dL (ref 0.3–1.2)
Total Protein: 5.6 g/dL — ABNORMAL LOW (ref 6.5–8.1)

## 2020-12-07 LAB — MAGNESIUM: Magnesium: 1.9 mg/dL (ref 1.7–2.4)

## 2020-12-07 LAB — LEGIONELLA PNEUMOPHILA SEROGP 1 UR AG: L. pneumophila Serogp 1 Ur Ag: NEGATIVE

## 2020-12-07 LAB — GLUCOSE, CAPILLARY
Glucose-Capillary: 125 mg/dL — ABNORMAL HIGH (ref 70–99)
Glucose-Capillary: 138 mg/dL — ABNORMAL HIGH (ref 70–99)
Glucose-Capillary: 74 mg/dL (ref 70–99)
Glucose-Capillary: 86 mg/dL (ref 70–99)
Glucose-Capillary: 87 mg/dL (ref 70–99)

## 2020-12-07 LAB — LACTIC ACID, PLASMA: Lactic Acid, Venous: >11 mmol/L (ref 0.5–1.9)

## 2020-12-07 LAB — PROCALCITONIN: Procalcitonin: <0.1 ng/mL

## 2020-12-07 LAB — MRSA NEXT GEN BY PCR, NASAL: MRSA by PCR Next Gen: NOT DETECTED

## 2020-12-07 LAB — PHOSPHORUS: Phosphorus: 2.4 mg/dL — ABNORMAL LOW (ref 2.5–4.6)

## 2020-12-07 MED ORDER — FOOD THICKENER (SIMPLYTHICK)
1.0000 | Freq: Three times a day (TID) | ORAL | Status: DC
  Administered 2020-12-07 – 2020-12-18 (×10): 1 via ORAL
  Filled 2020-12-07 (×62): qty 1

## 2020-12-07 MED ORDER — POTASSIUM CHLORIDE CRYS ER 20 MEQ PO TBCR
40.0000 meq | EXTENDED_RELEASE_TABLET | Freq: Once | ORAL | Status: AC
  Administered 2020-12-07: 40 meq via ORAL
  Filled 2020-12-07: qty 2

## 2020-12-07 NOTE — TOC Initial Note (Addendum)
Transition of Care [facility]) - Initial/Assessment Note    Patient Details  Name: Cody Hurley MRN: [id number] Date of Birth: November 09, 1925  Transition [facility] CM/SW Contact:    Shade Flood, LCSW Phone Number: 12/07/2020, 12:16 PM  Clinical Narrative:                  Pt admitted from his [place] home where he lives with his wife. Received TOC consult from pt's RN stating that pt/family wanted to see TOC. Met with pt and his wife at bedside. Pt's plan of care will be determined once oncology has the biopsy results they are waiting on. Pt and his wife are anticipating that pt will not pursue further oncology treatment.   Up until a couple weeks ago, it appears pt was quite independent in ADLs at home and was ambulating with a walker. Pt states that he does not want to go to a SNF at dc. Pt's wife seems concerned that she cannot manage his level of care at home. Provided information on short-term SNF rehab, [facility], Hospice, private duty caregivers, and DME. Ultimately, it was decided that once oncologist and PT/OT have follow up with pt, he and his wife will make further decisions on what they want to do.  TOC will follow and assist as needed.  Expected Discharge Plan: [place] Barriers to Discharge: Continued Medical Work up   Patient Goals and CMS Choice        Expected Discharge Plan and Services Expected Discharge Plan: [place] In-house Referral: Clinical Social Work     Living arrangements for the past 2 months: Single Family Home                                      Prior Living Arrangements/Services Living arrangements for the past 2 months: Single Family Home Lives with:: Spouse Patient language and need for interpreter reviewed:: Yes Do you feel safe going back to the place where you live?: Yes      Need for Family Participation in Patient Care: Yes (Comment) Care giver support system in place?: Yes (comment) Current home  services: DME Criminal Activity/Legal Involvement Pertinent to Current Situation/Hospitalization: No - Comment as needed  Activities of Daily Living Home Assistive Devices/Equipment: Walker (specify type) ADL Screening (condition at time of admission) Patient's cognitive ability adequate to safely complete daily activities?: No Is the patient deaf or have difficulty hearing?: Yes Does the patient have difficulty seeing, even when wearing glasses/contacts?: No Does the patient have difficulty concentrating, remembering, or making decisions?: Yes Patient able to express need for assistance with ADLs?: Yes Does the patient have difficulty dressing or bathing?: Yes Independently performs ADLs?: No Communication: Appropriate for developmental age Dressing (OT): Appropriate for developmental age, Needs assistance Is this a change from baseline?: Change from baseline, expected to last <3days Grooming: Appropriate for developmental age Feeding: Appropriate for developmental age Bathing: Appropriate for developmental age 85: Appropriate for developmental age In/Out Bed: Appropriate for developmental age 87 in Home: Independent with device (comment) Does the patient have difficulty walking or climbing stairs?: Yes Weakness of Legs: Both Weakness of Arms/Hands: None  Permission Sought/Granted                  Emotional Assessment Appearance:: Appears stated age Attitude/Demeanor/Rapport: Engaged Affect (typically observed): Pleasant Orientation: : Oriented to Self, Oriented to Place, Oriented  to  Time, Oriented to Situation Alcohol / Substance Use: Not Applicable Psych Involvement: No (comment)  Admission diagnosis:  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] Patient Active Problem List   Diagnosis Date Noted   Acidosis    Lactic acidosis    FTT (failure to thrive) in adult 12/02/2020   Hypercalcemia 12/02/2020   History of rectal cancer 12/02/2020   History of prostate cancer  12/02/2020   COVID-19 virus infection: Recent COVID-19 infection status posttreatment 12/02/2020   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

## 2020-12-07 NOTE — Consult Note (Signed)
[facility]  Total days of antibiotics 5/ ceftriaxone-metronidazole day 4               Reason for Consult:lactic acidosis   Referring Physician: Grandville Silos  Principal Problem:   Retroperitoneal mass Active Problems:   Pulmonary embolism (HCC)   FTT (failure to thrive) in adult   Hypercalcemia   History of rectal cancer   History of prostate cancer   COVID-19 virus infection: Recent COVID-19 infection status posttreatment   Acidosis   Lactic acidosis    HPI: Cody Hurley is a 87 y.o. male with hx of prostate ca in 1990s, rectal cancer in 2019 s/p low anterior resection with prior chemoradiation, and recently left lung subsegmental pulmonary emboli. He reports having unintentional weight loss and malaise and weakness/failure to thrive. He was recovering from covid roughly 2 weeks ago, complicated by PE. The CT angio for evaluation of PE also showed retroperitoneal lesions concerning for new metastatic cancer. He was admitted on 7/5, underwent biopsy with path still pending. Patient reports having dry mouth, no cough. ID asked to evaluate for reason of elevated LA. On labs has persistent LA of > 11. Procalcitonin is negative  CT showed:  Bulky multinodular soft tissue density in the right Peri and pararenal spaces concerning for metastatic disease and/or lymphoma. Additional periaortic/retroperitoneal adenopathy and conspicuous nodular thickening of the adrenal glands compatible with additional metastatic deposits.    Past Medical History:  Diagnosis Date   Colorectal cancer ([place]) 2016   Prostate cancer ([place])     Allergies:  Allergies  Allergen Reactions   Penicillin G Rash    Current antibiotics:   MEDICATIONS:  feeding supplement  237 mL Oral BID BM   food thickener  1 packet Oral TID WC, HS, 0200   multivitamin with minerals  1 tablet Oral Daily   nystatin  5 mL Oral QID   phosphorus  250 mg Oral BID   predniSONE  20 mg Oral Q breakfast     Social History   Tobacco Use   Smoking status: Never   Smokeless tobacco: Never  Vaping Use   Vaping Use: Never used  Substance Use Topics   Alcohol use: Never   Drug use: Never    History reviewed. No pertinent family history.   Review of Systems  Constitutional: + weight loss. +fatigue. Negative for fever, chills, diaphoresis, activity change, appetite change. HENT: Negative for congestion, sore throat, rhinorrhea, sneezing, trouble swallowing and sinus pressure.  Eyes: Negative for photophobia and visual disturbance.  Respiratory: Negative for cough, chest tightness, shortness of breath, wheezing and stridor.  Cardiovascular: Negative for chest pain, palpitations and leg swelling.  Gastrointestinal: Negative for nausea, vomiting, abdominal pain, diarrhea, constipation, blood in stool, abdominal distention and anal bleeding.  Genitourinary: Negative for dysuria, hematuria, flank pain and difficulty urinating.  Musculoskeletal: Negative for myalgias, back pain, joint swelling, arthralgias and gait problem.  Skin: Negative for color change, pallor, rash and wound.  Neurological: Negative for dizziness, tremors, weakness and light-headedness.  Hematological: Negative for adenopathy. Does not bruise/bleed easily.  Psychiatric/Behavioral: Negative for behavioral problems, confusion, sleep disturbance, dysphoric mood, decreased concentration and agitation.    OBJECTIVE: Temp:  [97.5 F (36.4 C)-97.9 F (36.6 C)] 97.8 F (36.6 C) (07/11 1421) Pulse Rate:  [72-96] 96 (07/11 1421) Resp:  [14-18] 18 (07/11 1421) BP: (104-120)/(62-75) 120/69 (07/11 1421) SpO2:  [94 %-96 %] 96 % (07/11 1421) Physical Exam  Constitutional: He is oriented to person,  place, and time. He appears well-developed and well-nourished. No distress.  HENT: decreased hearing Mouth/Throat: Oropharynx is clear and moist. No oropharyngeal exudate.  Cardiovascular: Normal rate, regular rhythm and normal heart  sounds. Exam reveals no gallop and no friction rub.  No murmur heard.  Pulmonary/Chest: Effort normal and breath sounds normal. No respiratory distress. He has no wheezes.  Abdominal: Soft. Bowel sounds are normal. He exhibits no distension. There is no tenderness. Ostomy in place Neurological: He is alert and oriented to person, place, and time.  Skin: Skin is warm and dry. Scattered echymosis Psychiatric: He has a normal mood and affect. His behavior is normal.    LABS: Results for orders placed or performed during the hospital encounter of 12/01/20 (from the past 48 hour(s))  Glucose, capillary     Status: Abnormal   Collection Time: 12/05/20  8:34 PM  Result Value Ref Range   Glucose-Capillary 136 (H) 70 - 99 mg/dL    Comment: Glucose reference range applies only to samples taken after fasting for at least 8 hours.  Heparin level (unfractionated)     Status: None   Collection Time: 12/05/20 11:10 PM  Result Value Ref Range   Heparin Unfractionated 0.48 0.30 - 0.70 IU/mL    Comment: (NOTE) The clinical reportable range upper limit is being lowered to >1.10 to align with the FDA approved guidance for the current laboratory assay.  If heparin results are below expected values, and patient dosage has  been confirmed, suggest follow up testing of antithrombin III levels. Performed at [facility], [place] [address] [place], [place] [address]   Glucose, capillary     Status: Abnormal   Collection Time: 12/05/20 11:58 PM  Result Value Ref Range   Glucose-Capillary 111 (H) 70 - 99 mg/dL    Comment: Glucose reference range applies only to samples taken after fasting for at least 8 hours.  Glucose, capillary     Status: None   Collection Time: 12/06/20  4:20 AM  Result Value Ref Range   Glucose-Capillary 94 70 - 99 mg/dL    Comment: Glucose reference range applies only to samples taken after fasting for at least 8 hours.  CBC     Status: Abnormal   Collection Time:  12/06/20  5:58 AM  Result Value Ref Range   WBC 4.9 4.0 - 10.5 K/uL   RBC 3.87 (L) 4.22 - 5.81 MIL/uL   Hemoglobin 13.2 13.0 - 17.0 g/dL   HCT 39.9 39.0 - 52.0 %   MCV 103.1 (H) 80.0 - 100.0 fL   MCH 34.1 (H) 26.0 - 34.0 pg   MCHC 33.1 30.0 - 36.0 g/dL   RDW 17.0 (H) 11.5 - 15.5 %   Platelets 184 150 - 400 K/uL   nRBC 0.0 0.0 - 0.2 %    Comment: Performed at [facility], [place] [address] [place], [place] [address]  Heparin level (unfractionated)     Status: None   Collection Time: 12/06/20  5:58 AM  Result Value Ref Range   Heparin Unfractionated 0.42 0.30 - 0.70 IU/mL    Comment: (NOTE) The clinical reportable range upper limit is being lowered to >1.10 to align with the FDA approved guidance for the current laboratory assay.  If heparin results are below expected values, and patient dosage has  been confirmed, suggest follow up testing of antithrombin III levels. Performed at [facility], [place] [address] [place], [place] [address]   Renal function panel  Status: Abnormal   Collection Time: 12/06/20  5:58 AM  Result Value Ref Range   Sodium 136 135 - 145 mmol/L   Potassium 3.3 (L) 3.5 - 5.1 mmol/L   Chloride 100 98 - 111 mmol/L   CO2 17 (L) 22 - 32 mmol/L   Glucose, Bld 96 70 - 99 mg/dL    Comment: Glucose reference range applies only to samples taken after fasting for at least 8 hours.   BUN 13 8 - 23 mg/dL   Creatinine, Ser 0.66 0.61 - 1.24 mg/dL   Calcium 9.8 8.9 - 10.3 mg/dL   Phosphorus 2.4 (L) 2.5 - 4.6 mg/dL   Albumin 2.5 (L) 3.5 - 5.0 g/dL   GFR, Estimated >60 >60 mL/min    Comment: (NOTE) Calculated using the CKD-EPI Creatinine Equation (2021)    Anion gap 19 (H) 5 - 15    Comment: Performed at [facility], [place] [address] [place], [place] [address]  Magnesium     Status: None   Collection Time: 12/06/20  5:58 AM  Result Value Ref Range   Magnesium 2.0 1.7 - 2.4 mg/dL    Comment: Performed at  [facility], [place] [address] [place], [place] [address]  Glucose, capillary     Status: None   Collection Time: 12/06/20  7:47 AM  Result Value Ref Range   Glucose-Capillary 80 70 - 99 mg/dL    Comment: Glucose reference range applies only to samples taken after fasting for at least 8 hours.  Lactic acid, plasma     Status: Abnormal   Collection Time: 12/06/20 10:29 AM  Result Value Ref Range   Lactic Acid, Venous >11.0 (HH) 0.5 - 1.9 mmol/L    Comment: CRITICAL RESULT CALLED TO, READ BACK BY AND VERIFIED WITHHali Marry RN AT 1126 12/06/20 MULLINS,T Performed at [facility], [place] [address] [place], [place] [address]   Glucose, capillary     Status: Abnormal   Collection Time: 12/06/20 12:33 PM  Result Value Ref Range   Glucose-Capillary 116 (H) 70 - 99 mg/dL    Comment: Glucose reference range applies only to samples taken after fasting for at least 8 hours.  Culture, blood (Routine X 2) w Reflex to ID Panel     Status: None (Preliminary result)   Collection Time: 12/06/20  1:42 PM   Specimen: BLOOD  Result Value Ref Range   Specimen Description      BLOOD RIGHT ANTECUBITAL Performed at [facility], [place] [address] [place], [place] [address]    Special Requests      BOTTLES DRAWN AEROBIC ONLY Blood Culture adequate volume Performed at [place] [address] [place], [place] [address]    Culture      NO GROWTH < 24 HOURS Performed at [place] [address] [place], [place] [address]    Report Status PENDING   Lactic acid, plasma     Status: Abnormal   Collection Time: 12/06/20  1:42 PM  Result Value Ref Range   Lactic Acid, Venous >11.0 (HH) 0.5 - 1.9 mmol/L    Comment: CRITICAL VALUE NOTED.  VALUE IS CONSISTENT WITH PREVIOUSLY REPORTED AND CALLED VALUE. Performed at [facility], [place] [address], [place],  [address]   Glucose, capillary     Status:  Abnormal   Collection Time: 12/06/20  3:42 PM  Result Value Ref Range   Glucose-Capillary 124 (H) 70 - 99  mg/dL    Comment: Glucose reference range applies only to samples taken after fasting for at least 8 hours.  Culture, blood (Routine X 2) w Reflex to ID Panel     Status: None (Preliminary result)   Collection Time: 12/06/20  4:11 PM   Specimen: BLOOD  Result Value Ref Range   Specimen Description      BLOOD RIGHT ANTECUBITAL Performed at [facility], [place] [address] [place], [place] [address]    Special Requests      BOTTLES DRAWN AEROBIC AND ANAEROBIC Blood Culture results may not be optimal due to an inadequate volume of blood received in culture bottles Performed at [facility], [place] [address], [place], [place] [address]    Culture      NO GROWTH < 24 HOURS Performed at [place] [address] [place], [place] [address]    Report Status PENDING   Lactic acid, plasma     Status: Abnormal   Collection Time: 12/06/20  4:15 PM  Result Value Ref Range   Lactic Acid, Venous >11.0 (HH) 0.5 - 1.9 mmol/L    Comment: CRITICAL VALUE NOTED.  VALUE IS CONSISTENT WITH PREVIOUSLY REPORTED AND CALLED VALUE. Performed at [facility], [place] [address], [place], [place] [address]   Glucose, capillary     Status: Abnormal   Collection Time: 12/06/20  6:20 PM  Result Value Ref Range   Glucose-Capillary 136 (H) 70 - 99 mg/dL    Comment: Glucose reference range applies only to samples taken after fasting for at least 8 hours.  Glucose, capillary     Status: None   Collection Time: 12/07/20 12:03 AM  Result Value Ref Range   Glucose-Capillary 86 70 - 99 mg/dL    Comment: Glucose reference range applies only to samples taken after fasting for at least 8 hours.  CBC     Status: Abnormal   Collection Time: 12/07/20  4:59 AM  Result Value Ref Range   WBC 5.7 4.0 - 10.5 K/uL   RBC 3.84 (L) 4.22 - 5.81 MIL/uL   Hemoglobin  13.1 13.0 - 17.0 g/dL   HCT 39.2 39.0 - 52.0 %   MCV 102.1 (H) 80.0 - 100.0 fL   MCH 34.1 (H) 26.0 - 34.0 pg   MCHC 33.4 30.0 - 36.0 g/dL   RDW 16.9 (H) 11.5 - 15.5 %   Platelets 185 150 - 400 K/uL   nRBC 0.0 0.0 - 0.2 %    Comment: Performed at [facility], [place] [address] [place], [place] [address]  Heparin level (unfractionated)     Status: None   Collection Time: 12/07/20  4:59 AM  Result Value Ref Range   Heparin Unfractionated 0.37 0.30 - 0.70 IU/mL    Comment: (NOTE) The clinical reportable range upper limit is being lowered to >1.10 to align with the FDA approved guidance for the current laboratory assay.  If heparin results are below expected values, and patient dosage has  been confirmed, suggest follow up testing of antithrombin III levels. Performed at [facility], [place] [address] [place], [place] [address]   Lactic acid, plasma     Status: Abnormal   Collection Time: 12/07/20  4:59 AM  Result Value Ref Range   Lactic Acid, Venous >11.0 (HH) 0.5 - 1.9 mmol/L    Comment: CRITICAL VALUE NOTED.  VALUE IS CONSISTENT WITH PREVIOUSLY REPORTED AND CALLED VALUE. Performed at [facility], [place] [address], [place], [place] [address]   Comprehensive metabolic panel     Status: Abnormal   Collection Time: 12/07/20  4:59 AM  Result Value Ref Range   Sodium 135 135 - 145 mmol/L   Potassium 3.3 (L) 3.5 - 5.1 mmol/L   Chloride 100 98 - 111 mmol/L   CO2 16 (L) 22 - 32 mmol/L   Glucose, Bld 70 70 - 99 mg/dL    Comment: Glucose reference range applies only to samples taken after fasting for at least 8 hours.   BUN 11 8 - 23 mg/dL   Creatinine, Ser 0.54 (L) 0.61 - 1.24 mg/dL   Calcium 9.3 8.9 - 10.3 mg/dL   Total Protein 5.6 (L) 6.5 - 8.1 g/dL   Albumin 2.4 (L) 3.5 - 5.0 g/dL   AST 65 (H) 15 - 41 U/L   ALT 33 0 - 44 U/L   Alkaline Phosphatase 69 38 - 126 U/L   Total Bilirubin 0.7 0.3 - 1.2 mg/dL   GFR, Estimated >60  >60 mL/min    Comment: (NOTE) Calculated using the CKD-EPI Creatinine Equation (2021)    Anion gap 19 (H) 5 - 15    Comment: Performed at [facility], [place] [address], [place], [place] [address]  Magnesium     Status: None   Collection Time: 12/07/20  4:59 AM  Result Value Ref Range   Magnesium 1.9 1.7 - 2.4 mg/dL    Comment: Performed at [facility], [place] [address] [place], [place] [address]  Phosphorus     Status: Abnormal   Collection Time: 12/07/20  4:59 AM  Result Value Ref Range   Phosphorus 2.4 (L) 2.5 - 4.6 mg/dL    Comment: Performed at [facility], [place] [address], [place], [place] [address]  Procalcitonin     Status: None   Collection Time: 12/07/20  4:59 AM  Result Value Ref Range   Procalcitonin <0.10 ng/mL    Comment:        Interpretation: PCT (Procalcitonin) <= 0.5 ng/mL: Systemic infection (sepsis) is not likely. Local bacterial infection is possible. (NOTE)       Sepsis PCT Algorithm           Lower Respiratory Tract                                      Infection PCT Algorithm    ----------------------------     ----------------------------         PCT < 0.25 ng/mL                PCT < 0.10 ng/mL          Strongly encourage             Strongly discourage   discontinuation of antibiotics    initiation of antibiotics    ----------------------------     -----------------------------       PCT 0.25 - 0.50 ng/mL            PCT 0.10 - 0.25 ng/mL               OR       >80% decrease in PCT            Discourage initiation of  antibiotics      Encourage discontinuation           of antibiotics    ----------------------------     -----------------------------         PCT >= 0.50 ng/mL              PCT 0.26 - 0.50 ng/mL               AND        <80% decrease in PCT             Encourage initiation of                                             antibiotics        Encourage continuation           of antibiotics    ----------------------------     -----------------------------        PCT >= 0.50 ng/mL                  PCT > 0.50 ng/mL               AND         increase in PCT                  Strongly encourage                                      initiation of antibiotics    Strongly encourage escalation           of antibiotics                                     -----------------------------                                           PCT <= 0.25 ng/mL                                                 OR                                        > 80% decrease in PCT                                      Discontinue / Do not initiate                                             antibiotics  Performed at [place] [address], [place], [place] [address]   Glucose, capillary     Status: None   Collection  Time: 12/07/20  5:26 AM  Result Value Ref Range   Glucose-Capillary 74 70 - 99 mg/dL    Comment: Glucose reference range applies only to samples taken after fasting for at least 8 hours.  Urinalysis, Routine w reflex microscopic Urine, Clean Catch     Status: Abnormal   Collection Time: 12/07/20 10:17 AM  Result Value Ref Range   Color, Urine YELLOW YELLOW   APPearance CLEAR CLEAR   Specific Gravity, Urine 1.021 1.005 - 1.030   pH 5.0 5.0 - 8.0   Glucose, UA NEGATIVE NEGATIVE mg/dL   Hgb urine dipstick NEGATIVE NEGATIVE   Bilirubin Urine NEGATIVE NEGATIVE   Ketones, ur 20 (A) NEGATIVE mg/dL   Protein, ur NEGATIVE NEGATIVE mg/dL   Nitrite NEGATIVE NEGATIVE   Leukocytes,Ua NEGATIVE NEGATIVE    Comment: Performed at [place] [address], [place], [place] [address]  MRSA Next Gen by PCR, Nasal     Status: None   Collection Time: 12/07/20 11:00 AM   Specimen: Nasal Mucosa; Nasal Swab  Result Value Ref Range   MRSA by PCR Next Gen NOT DETECTED NOT DETECTED    Comment: (NOTE) The GeneXpert MRSA  Assay (FDA approved for NASAL specimens only), is one component of a comprehensive MRSA colonization surveillance program. It is not intended to diagnose MRSA infection nor to guide or monitor treatment for MRSA infections. Test performance is not FDA approved in patients less than 40 years old. Performed at [facility], [place] [address], [place], [place] [address]   Glucose, capillary     Status: Abnormal   Collection Time: 12/07/20 12:02 PM  Result Value Ref Range   Glucose-Capillary 138 (H) 70 - 99 mg/dL    Comment: Glucose reference range applies only to samples taken after fasting for at least 8 hours.  Glucose, capillary     Status: Abnormal   Collection Time: 12/07/20  5:57 PM  Result Value Ref Range   Glucose-Capillary 125 (H) 70 - 99 mg/dL    Comment: Glucose reference range applies only to samples taken after fasting for at least 8 hours.    MICRO:  IMAGING: CT ABDOMEN PELVIS W CONTRAST  Result Date: 12/06/2020 CLINICAL DATA:  Sepsis, elevated lactic acidosis EXAM: CT ABDOMEN AND PELVIS WITH CONTRAST TECHNIQUE: Multidetector CT imaging of the abdomen and pelvis was performed using the standard protocol following bolus administration of intravenous contrast. CONTRAST:  119mL OMNIPAQUE IOHEXOL 300 MG/ML  SOLN COMPARISON:  CT 11/26/2020 FINDINGS: Lower chest: Some coarsened reticular interstitial changes noted in the lung bases, not significantly changed from prior. No consolidative process or layering effusion. Cardiac size within normal limits. Coronary artery calcifications. No pericardial effusion. Hepatobiliary: Nodular hepatic surface contour. Caudate and left lobe hypertrophy. No focal liver lesion. Multiple calcified gallstones within the gallbladder. No focal pericholecystic inflammation. No biliary ductal dilatation or intraductal gallstones. Pancreas: No pancreatic ductal dilatation or surrounding inflammatory changes. Spleen: Normal in size. No concerning  splenic lesions. Adrenals/Urinary Tract: Marked thickening of the bilateral adrenal glands measuring up to 2.7 cm on the left and 2.6 cm on the right concerning for metastatic infiltration. Stable appearance of the bilateral kidneys with small fluid attenuation cyst in the interpolar left kidney. Bilateral the urothelial thickening, right greater than left, increasing conspicuity from comparison prior. Mild circumferential bladder wall thickening noted as well. No urolithiasis or frank hydronephrosis. Bulky multinodular masslike soft tissue attenuation is seen throughout the right perinephric and pararenal space and extending into the  renal sinus, overall appearance unchanged from comparison. Stomach/Bowel: Hiatal hernia. Distal stomach and duodenum with some questionable mural thickening versus underdistention or peristaltic contraction. No other small bowel thickening or dilatation is seen. Postsurgical changes from prior low anterior resection with a left lower quadrant colostomy in place some mild circumferential thickening of the colonic remnant, similar to prior. Presacral soft tissue attenuation and fluid is also quite similar to comparison. No evidence of bowel obstruction. Extensive colonic diverticulosis without evidence of acute diverticulitis. Vascular/Lymphatic: Extensive atherosclerotic calcification throughout the abdominal aorta and branch vessels. Retroperitoneal adenopathy, along the left periaortic region is again seen measuring up to 2.3 cm short axis, unchanged from prior. Reproductive: Prostate appears surgically absent. Other: Small volume ascites, slightly increasing from comparison prior with some increasing edematous changes of the central mesentery. Presacral soft tissue thickening is stable, described above. No free air. Peristomal herniation of a solitary loop of small bowel at the site of end colostomy in the left upper quadrant. No resulting obstruction or vascular compromise.  Musculoskeletal: No suspicious lytic or blastic lesions. Grade 1 anterolisthesis L4 on 5. Dextrocurvature of the lumbar spine, apex L5. Multilevel degenerative changes are present in the imaged portions of the spine. IMPRESSION: Bulky multinodular soft tissue density in the right Peri and pararenal spaces concerning for metastatic disease and/or lymphoma. Additional periaortic/retroperitoneal adenopathy and conspicuous nodular thickening of the adrenal glands compatible with additional metastatic deposits. Marked urothelial thickening of the right ureter and renal sinus. More mild changes on the left, could reflect ascending urinary tract infection/pyelitis. Correlate with urinalysis. Increasing abdominal ascites and edematous changes in the mesentery, correlate with fluid status. Background of cirrhotic change.  No focal liver lesion. Mild thickening of the distal stomach and duodenal sweep, can be related to cirrhotic features/portal enteropathy though should correlate for clinical features of gastric duodenitis or other complication. Cholelithiasis without evidence of acute cholecystitis. Postsurgical changes from low anterior resection. Fat and small bowel containing parastomal hernia at the site of end colostomy. Stable thickening along the presacral space. Mild mural thickening of the rectal remnant, nonspecific. Diverticulosis without evidence of acute diverticulitis. Electronically Signed   By: Lovena Le M.D.   On: 12/06/2020 23:40   DG Swallowing Func-Speech Pathology  Result Date: 12/07/2020 Formatting of this result is different from the original. Objective Swallowing Evaluation: Type of Study: MBS-Modified Barium Swallow Study  Patient Details Name: Isaiahs Chancy MRN: [id number] Date of Birth: 1925-08-23 Today's Date: 12/07/2020 Time: SLP Start Time (ACUTE ONLY): 1210 -SLP Stop Time (ACUTE ONLY): 1235 SLP Time Calculation (min) (ACUTE ONLY): 25 min Past Medical History: Past Medical History: Diagnosis  Date  Colorectal cancer ([place]) 2016  Prostate cancer [facility])  Past Surgical History: Past Surgical History: Procedure Laterality Date  COLOSTOMY    MELANOMA EXCISION    ear  PROSTATE SURGERY  1991 HPI: Trayvon Trumbull is a 87 y.o. male with history of rectal cancer last treated on 2017-experiencing increasing weakness and poor appetite.  Patient was diagnosed with COVID infection about 2 weeks ago was treated with antiviral and p.o. prednisone.  Subsequent which patient also had come to the ER on June 26 with shortness of breath and at the time patient CT angiogram showed 2 small pulmonary embolism and was placed on apixaban.  The CT scan also showed retroperitoneal lesions concerning for metastatic cancer.  Patient had subsequently followed with [name] patient's oncologist who ordered a CT abdomen pelvis confirms multiple lesions and since patient has been progressively worsening and finding  it difficult to ambulate has poor appetite lost at least 20 to 30 pounds in the last month.  Patient admitted for further work-up.  On exam patient appears generally weak otherwise nonfocal.  Has mild swelling of the legs. Chest x-ray with interval worsening of right midlung and suprahilar pulmonary opacities since prior study, concern for possible CAP vs aspiration PNA.  Subjective: Pt seen in radiology for instrumental assessment of swallow function and safety Assessment / Plan / Recommendation CHL IP CLINICAL IMPRESSIONS 12/07/2020 Clinical Impression Pt presents with mild oral, moderate-severe pharyngeal dysphagia, characterized as follows: Orally, pt exhibits poor bolus formation and premature spill. Oral prep is slow and discoordinated. Pharyngeal swallow is characterized by initation of the swallow reflex at the vallecular sinus most of the time, and at the pyriform sinus intermittently. Post-swallow residue is noted within the vallecular sinus, lateral channels, and pyriform sinus, and increases risk of penetration and  aspiration during meals. Penetration was noted on nectar thick liquids during and after the swallow. Aspiration of thin liquids was seen during the swallow, due to poor epiglottic inversion and insufficient airway protection. Npo cough response was elicited after aspiration events. Pt was noted to pull vallecular residue back into the oral cavity on several occasions. Esophageal sweep revealed it to be very slow to clear. Chin tuck and effortful swallow were not effective to clear vallecular residue. At this time, pt is at significantly high risk for aspiration with continued PO intake based on performance on today's instrumental study. Recommend dys 2 diet and nectar thick liquids, with meds crushed in puree. Also recommend Palliative Care consult given multiple comorbidities. RN and MD informed. SLP will follow for education and diet tolerance assessment.  SLP Visit Diagnosis Dysphagia, pharyngoesophageal phase (R13.14)     Impact on safety and function Severe aspiration risk;Risk for inadequate nutrition/hydration   CHL IP TREATMENT RECOMMENDATION 12/07/2020 Treatment Recommendations Therapy as outlined in treatment plan below   Prognosis 12/07/2020 Prognosis for Safe Diet Advancement Fair     CHL IP DIET RECOMMENDATION 12/07/2020 SLP Diet Recommendations Dysphagia 2 (Fine chop) solids;Nectar thick liquid Liquid Administration via Cup;Straw Medication Administration Crushed with puree Compensations Slow rate;Small sips/bites;Follow solids with liquid Postural Changes Remain semi-upright after after feeds/meals (Comment);Seated upright at 90 degrees   CHL IP OTHER RECOMMENDATIONS 12/07/2020 Recommended Consults Consider esophageal assessment Oral Care Recommendations Oral care QID Other Recommendations Order thickener from pharmacy   CHL IP FOLLOW UP RECOMMENDATIONS 12/07/2020 Follow up Recommendations Home health SLP;Skilled Nursing facility   [facility] IP FREQUENCY AND DURATION 12/07/2020 Speech Therapy Frequency (ACUTE  ONLY) min 1 x/week Treatment Duration 1 week;2 weeks      CHL IP ORAL PHASE 12/07/2020 Oral Phase Impaired   CHL IP PHARYNGEAL PHASE 12/07/2020 Pharyngeal Phase Impaired   CHL IP CERVICAL ESOPHAGEAL PHASE 12/07/2020 Cervical Esophageal Phase Impaired   Celia B. Quentin Ore, [facility], [place] Speech Language Pathologist Office: [phone] Pager: [phone] Shonna Chock 12/07/2020, 2:05 PM               HISTORICAL MICRO/IMAGING  Assessment/Plan:  87yo M with unintentional weight loss found to have retroperitoneal bulky lymphadenopathy concerning for malignancy with lactic acidosis and anion gap requiring bicarb. He is not hypotensive or have other signs of sepsis to account for lactic acidosis. He is being treated for aspiration pneumonia with ceftriaxone plus metronidazole  - would finish 5 day course of ceftriaxone plus metronidazole (or convert to amox/clav to take in applesauce) to complete course- just 1 more day of  tx needed  - lactic acidosis likely from his malignancy. Path pending. Patient-family supporting hospice care. Decide if would benefit from repletion with oral bicarb upon discharge.  It was a pleasure to meet family.  Will sign off.

## 2020-12-07 NOTE — Progress Notes (Signed)
[place] for IV heparin (PTA apixaban) Indication: Recent Hx PE  Allergies  Allergen Reactions   Penicillin G Rash    Patient Measurements: Height: 5\' 7"  (170.2 cm) Weight: 61.4 kg (135 lb 6.4 oz) IBW/kg (Calculated) : 66.1 Heparin Dosing Weight: TBW  Vital Signs: Temp: 97.5 F (36.4 C) (07/11 0523) Temp Source: Axillary (07/11 0523) BP: 104/62 (07/11 0523) Pulse Rate: 72 (07/11 0523)  Labs: Recent Labs    12/05/20 0546 12/05/20 1500 12/05/20 2310 12/06/20 0558 12/07/20 0459  HGB 13.3  --   --  13.2 13.1  HCT 39.4  --   --  39.9 39.2  PLT 179  --   --  184 185  HEPARINUNFRC 0.20*   < > 0.48 0.42 0.37  CREATININE 0.60*  --   --  0.66 0.54*   < > = values in this interval not displayed.     Estimated Creatinine Clearance: 50.1 mL/min (A) (by C-G formula based on SCr of 0.54 mg/dL (L)).   Medical History: Past Medical History:  Diagnosis Date   Colorectal cancer ([place]) 2016   Prostate cancer ([place])     Medications:  Medications Prior to Admission  Medication Sig Dispense Refill Last Dose   acetaminophen (TYLENOL) 500 MG tablet Take 500-1,000 mg by mouth every 6 (six) hours as needed for mild pain (or headaches).   unk   APIXABAN (ELIQUIS) VTE STARTER PACK (10MG  AND 5MG ) Take as directed on package: start with two-5mg  tablets twice daily for 7 days. On day 8, switch to one-5mg  tablet twice daily. (Patient taking differently: Take 5-10 mg by mouth See admin instructions. Take 10 mg by mouth two times a day for 7 days, then decrease to 5 mg two times a day on "day 8") 1 each 0 12/01/2020 at 0730   Cholecalciferol (VITAMIN D-3) 25 MCG (1000 UT) CAPS Take 2,000 Units by mouth daily with breakfast.   12/01/2020 at am   Multiple Vitamin (MULTIVITAMIN) tablet Take 1 tablet by mouth daily.   12/01/2020   oxybutynin (DITROPAN) 5 MG tablet Take 5 mg by mouth daily.   12/01/2020 at am   vitamin B-12 (CYANOCOBALAMIN) 100 MCG tablet Take 100 mcg by  mouth daily.   12/01/2020   Scheduled:   feeding supplement  237 mL Oral BID BM   fluconazole  100 mg Oral Daily   multivitamin with minerals  1 tablet Oral Daily   nystatin  5 mL Oral QID   phosphorus  250 mg Oral BID   predniSONE  20 mg Oral Q breakfast   sodium chloride (PF)       Infusions:   cefTRIAXone (ROCEPHIN)  IV 2 g (12/06/20 1333)   heparin 800 Units/hr (12/05/20 3810)   metronidazole Stopped (12/07/20 0315)   sodium bicarbonate 150 mEq in D5W infusion 100 mL/hr at 12/07/20 1751   vancomycin     Assessment: 31 yoM with PMH rectal cancer, recently started on Eliquis for PE 11/22/20, admitted directly from [facility] for inpatient workup for possible metastatic malignancy. Eliquis held for retroperitoneal/renal Bx, with Pharmacy to dose heparin in its place.  Baseline aPTT slightly elevated; baseline heparin level markedly elevated as expected with recent DOAC Prior anticoagulation: Eliquis 10 mg bid, transitioned to 5 mg BID today 7/5 with last dose at 0730  Significant events: 7/7: Bx of R peri-renal mass  Today, 12/07/2020: Daily Heparin level therapeutic (0.37) on 800 units/hr CBC: stable WNL No bleeding or issues to report  Goal of Therapy: Heparin level 0.3-0.7 units/ml Monitor platelets by anticoagulation protocol: Yes  Plan: Continue IV heparin infusion at 800 units/hr  Daily CBC and heparin level Monitor for signs of bleeding or thrombosis F/u for ability to resume Eliquis  Peggyann Juba, PharmD, BCPS Pharmacy: [phone] 12/07/2020 7:01 AM

## 2020-12-07 NOTE — Progress Notes (Signed)
IP PROGRESS NOTE  Subjective:   Cody Hurley reports feeling better.  He had a good appetite yesterday.  He continues to have a dry mouth.  Objective: Vital signs in last 24 hours: Blood pressure 120/69, pulse 96, temperature 97.8 F (36.6 C), temperature source Oral, resp. rate 18, height 5\' 7"  (1.702 m), weight 135 lb 6.4 oz (61.4 kg), SpO2 96 %.  Intake/Output from previous day: 07/10 0701 - 07/11 0700 In: 2636 [P.O.:480; I.V.:1389.6; IV Piggyback:766.4] Out: 3600 [Urine:3600]  Physical Exam:  HEENT: Dryness of the tongue and buccal mucosa, no thrush  Abdomen: No hepatosplenomegaly, no mass, nontender, left lower quadrant colostomy Extremities: No leg edema Neurologic: Alert, follows commands, oriented   Lab Results: Recent Labs    12/06/20 0558 12/07/20 0459  WBC 4.9 5.7  HGB 13.2 13.1  HCT 39.9 39.2  PLT 184 185    BMET Recent Labs    12/06/20 0558 12/07/20 0459  NA 136 135  K 3.3* 3.3*  CL 100 100  CO2 17* 16*  GLUCOSE 96 70  BUN 13 11  CREATININE 0.66 0.54*  CALCIUM 9.8 9.3  Ionized calcium on 12/03/2020: 5.1  Lab Results  Component Value Date   CEA1 2.57 11/18/2020    Studies/Results: CT ABDOMEN PELVIS W CONTRAST  Result Date: 12/06/2020 CLINICAL DATA:  Sepsis, elevated lactic acidosis EXAM: CT ABDOMEN AND PELVIS WITH CONTRAST TECHNIQUE: Multidetector CT imaging of the abdomen and pelvis was performed using the standard protocol following bolus administration of intravenous contrast. CONTRAST:  129mL OMNIPAQUE IOHEXOL 300 MG/ML  SOLN COMPARISON:  CT 11/26/2020 FINDINGS: Lower chest: Some coarsened reticular interstitial changes noted in the lung bases, not significantly changed from prior. No consolidative process or layering effusion. Cardiac size within normal limits. Coronary artery calcifications. No pericardial effusion. Hepatobiliary: Nodular hepatic surface contour. Caudate and left lobe hypertrophy. No focal liver lesion. Multiple calcified  gallstones within the gallbladder. No focal pericholecystic inflammation. No biliary ductal dilatation or intraductal gallstones. Pancreas: No pancreatic ductal dilatation or surrounding inflammatory changes. Spleen: Normal in size. No concerning splenic lesions. Adrenals/Urinary Tract: Marked thickening of the bilateral adrenal glands measuring up to 2.7 cm on the left and 2.6 cm on the right concerning for metastatic infiltration. Stable appearance of the bilateral kidneys with small fluid attenuation cyst in the interpolar left kidney. Bilateral the urothelial thickening, right greater than left, increasing conspicuity from comparison prior. Mild circumferential bladder wall thickening noted as well. No urolithiasis or frank hydronephrosis. Bulky multinodular masslike soft tissue attenuation is seen throughout the right perinephric and pararenal space and extending into the renal sinus, overall appearance unchanged from comparison. Stomach/Bowel: Hiatal hernia. Distal stomach and duodenum with some questionable mural thickening versus underdistention or peristaltic contraction. No other small bowel thickening or dilatation is seen. Postsurgical changes from prior low anterior resection with a left lower quadrant colostomy in place some mild circumferential thickening of the colonic remnant, similar to prior. Presacral soft tissue attenuation and fluid is also quite similar to comparison. No evidence of bowel obstruction. Extensive colonic diverticulosis without evidence of acute diverticulitis. Vascular/Lymphatic: Extensive atherosclerotic calcification throughout the abdominal aorta and branch vessels. Retroperitoneal adenopathy, along the left periaortic region is again seen measuring up to 2.3 cm short axis, unchanged from prior. Reproductive: Prostate appears surgically absent. Other: Small volume ascites, slightly increasing from comparison prior with some increasing edematous changes of the central  mesentery. Presacral soft tissue thickening is stable, described above. No free air. Peristomal herniation of a solitary loop  of small bowel at the site of end colostomy in the left upper quadrant. No resulting obstruction or vascular compromise. Musculoskeletal: No suspicious lytic or blastic lesions. Grade 1 anterolisthesis L4 on 5. Dextrocurvature of the lumbar spine, apex L5. Multilevel degenerative changes are present in the imaged portions of the spine. IMPRESSION: Bulky multinodular soft tissue density in the right Peri and pararenal spaces concerning for metastatic disease and/or lymphoma. Additional periaortic/retroperitoneal adenopathy and conspicuous nodular thickening of the adrenal glands compatible with additional metastatic deposits. Marked urothelial thickening of the right ureter and renal sinus. More mild changes on the left, could reflect ascending urinary tract infection/pyelitis. Correlate with urinalysis. Increasing abdominal ascites and edematous changes in the mesentery, correlate with fluid status. Background of cirrhotic change.  No focal liver lesion. Mild thickening of the distal stomach and duodenal sweep, can be related to cirrhotic features/portal enteropathy though should correlate for clinical features of gastric duodenitis or other complication. Cholelithiasis without evidence of acute cholecystitis. Postsurgical changes from low anterior resection. Fat and small bowel containing parastomal hernia at the site of end colostomy. Stable thickening along the presacral space. Mild mural thickening of the rectal remnant, nonspecific. Diverticulosis without evidence of acute diverticulitis. Electronically Signed   By: Lovena Le M.D.   On: 12/06/2020 23:40   DG Swallowing Func-Speech Pathology  Result Date: 12/07/2020 Formatting of this result is different from the original. Objective Swallowing Evaluation: Type of Study: MBS-Modified Barium Swallow Study  Patient Details Name:  Cody Hurley MRN: [id number] Date of Birth: Feb 11, 1926 Today's Date: 12/07/2020 Time: SLP Start Time (ACUTE ONLY): 1210 -SLP Stop Time (ACUTE ONLY): 1235 SLP Time Calculation (min) (ACUTE ONLY): 25 min Past Medical History: Past Medical History: Diagnosis Date  Colorectal cancer ([place]) 2016  Prostate cancer [facility])  Past Surgical History: Past Surgical History: Procedure Laterality Date  COLOSTOMY    MELANOMA EXCISION    ear  PROSTATE SURGERY  1991 HPI: Cody Hurley is a 87 y.o. male with history of rectal cancer last treated on 2017-experiencing increasing weakness and poor appetite.  Patient was diagnosed with COVID infection about 2 weeks ago was treated with antiviral and p.o. prednisone.  Subsequent which patient also had come to the ER on June 26 with shortness of breath and at the time patient CT angiogram showed 2 small pulmonary embolism and was placed on apixaban.  The CT scan also showed retroperitoneal lesions concerning for metastatic cancer.  Patient had subsequently followed with Cody Hurley patient's oncologist who ordered a CT abdomen pelvis confirms multiple lesions and since patient has been progressively worsening and finding it difficult to ambulate has poor appetite lost at least 20 to 30 pounds in the last month.  Patient admitted for further work-up.  On exam patient appears generally weak otherwise nonfocal.  Has mild swelling of the legs. Chest x-ray with interval worsening of right midlung and suprahilar pulmonary opacities since prior study, concern for possible CAP vs aspiration PNA.  Subjective: Pt seen in radiology for instrumental assessment of swallow function and safety Assessment / Plan / Recommendation CHL IP CLINICAL IMPRESSIONS 12/07/2020 Clinical Impression Pt presents with mild oral, moderate-severe pharyngeal dysphagia, characterized as follows: Orally, pt exhibits poor bolus formation and premature spill. Oral prep is slow and discoordinated. Pharyngeal swallow is characterized by  initation of the swallow reflex at the vallecular sinus most of the time, and at the pyriform sinus intermittently. Post-swallow residue is noted within the vallecular sinus, lateral channels, and pyriform sinus, and increases risk of  penetration and aspiration during meals. Penetration was noted on nectar thick liquids during and after the swallow. Aspiration of thin liquids was seen during the swallow, due to poor epiglottic inversion and insufficient airway protection. Npo cough response was elicited after aspiration events. Pt was noted to pull vallecular residue back into the oral cavity on several occasions. Esophageal sweep revealed it to be very slow to clear. Chin tuck and effortful swallow were not effective to clear vallecular residue. At this time, pt is at significantly high risk for aspiration with continued PO intake based on performance on today's instrumental study. Recommend dys 2 diet and nectar thick liquids, with meds crushed in puree. Also recommend Palliative Care consult given multiple comorbidities. RN and MD informed. SLP will follow for education and diet tolerance assessment.  SLP Visit Diagnosis Dysphagia, pharyngoesophageal phase (R13.14)     Impact on safety and function Severe aspiration risk;Risk for inadequate nutrition/hydration   CHL IP TREATMENT RECOMMENDATION 12/07/2020 Treatment Recommendations Therapy as outlined in treatment plan below   Prognosis 12/07/2020 Prognosis for Safe Diet Advancement Fair     CHL IP DIET RECOMMENDATION 12/07/2020 SLP Diet Recommendations Dysphagia 2 (Fine chop) solids;Nectar thick liquid Liquid Administration via Cup;Straw Medication Administration Crushed with puree Compensations Slow rate;Small sips/bites;Follow solids with liquid Postural Changes Remain semi-upright after after feeds/meals (Comment);Seated upright at 90 degrees   CHL IP OTHER RECOMMENDATIONS 12/07/2020 Recommended Consults Consider esophageal assessment Oral Care Recommendations Oral  care QID Other Recommendations Order thickener from pharmacy   CHL IP FOLLOW UP RECOMMENDATIONS 12/07/2020 Follow up Recommendations Home health SLP;Skilled Nursing facility   [facility] IP FREQUENCY AND DURATION 12/07/2020 Speech Therapy Frequency (ACUTE ONLY) min 1 x/week Treatment Duration 1 week;2 weeks      CHL IP ORAL PHASE 12/07/2020 Oral Phase Impaired   CHL IP PHARYNGEAL PHASE 12/07/2020 Pharyngeal Phase Impaired   CHL IP CERVICAL ESOPHAGEAL PHASE 12/07/2020 Cervical Esophageal Phase Impaired   Celia B. Quentin Ore, [facility], [place] Speech Language Pathologist Office: [phone] Pager: [phone] Shonna Chock 12/07/2020, 2:05 PM               Medications: I have reviewed the patient's current medications.  Assessment/Plan: Stage III rectal cancer,ypT4a,N2 status post a low anterior resection in September 2017 Presenting November 2016 with a mass at 10 cm from the anus, clinical stage II by [id number] Neoadjuvant Xeloda and radiation beginning 06/01/2015, completed 20 of 28 planned fractions, discontinued secondary to toxicity Elevated pretreatment CEA CT chest 04/10/2019-no evidence of metastatic disease, changes of interstitial lung disease, changes suggestive of cirrhosis CT chest 11/22/2020-scattered dependent groundglass airspace opacity and irregular interstitial opacity-nonspecific infectious or inflammatory, new bilateral adrenal nodules, extensive ill-defined soft tissue surrounding the right kidney suspicious for malignancy CT abdomen/pelvis 11/26/2020-bilateral adrenal masses, multinodular masslike soft tissue density throughout the right perinephric space and renal sinus, new left periaortic and retroperitoneal adenopathy, cirrhosis   Prostate cancer 1990 treated with a prostatectomy History of a TIA Carpal tunnel syndrome Palpable liver edge-ultrasound abdomen 04/10/2019-fatty infiltration of the liver, normal portal vein flow Left lung subsegmental pulmonary emboli  11/22/2020-apixaban Anorexia/weight loss COVID-19 infection June 2022 Admission 12/01/2020 with failure to thrive Elevated calcium-normal ionized calcium 12/03/2020     Cody Hurley appears partially improved with intravenous hydration and initiation of prednisone therapy.  The pathology report from the retroperitoneal mass biopsy is still pending. I discussed the differential diagnosis again today with Cody Hurley and Cody Hurley.  We also discussed disposition plans.  She indicates he would like to  return home.  The most likely plan will be discharged to home with hospice care if she can arrange for additional help in the home.  He has a lactic acidosis of unclear etiology.  No clinical evidence of an infection.  The lactic acidosis may be related to the metastatic tumor burden.  Recommendations: 1.  Await results of retroperitoneal biopsy. 2.  Continue prednisone as an appetite stimulant 3.  Social work consult   LOS: 6 days   Betsy Coder, MD   12/07/2020, 4:29 PM

## 2020-12-07 NOTE — Progress Notes (Signed)
PROGRESS NOTE    Cody Hurley  UXL:[id number] DOB: [birth year] DOA: 12/01/2020 PCP: [name], MD    No chief complaint on file.   Brief Narrative:  HPI per [name] is a 87 y.o. male with history of rectal cancer last treated on 2017-experiencing increasing weakness and poor appetite.  Patient was diagnosed with COVID infection about 2 weeks ago was treated with antiviral and p.o. prednisone.  Subsequent which patient also had come to the ER on June 26 with shortness of breath and at the time patient CT angiogram showed 2 small pulmonary embolism and was placed on apixaban.  The CT scan also showed retroperitoneal lesions concerning for metastatic cancer.  Patient had subsequently followed with [name] patient's oncologist who ordered a CT abdomen pelvis confirms multiple lesions and since patient has been progressively worsening and finding it difficult to ambulate has poor appetite lost at least 20 to 30 pounds in the last month.  Patient admitted for further work-up.  On exam patient appears generally weak otherwise nonfocal.  Has mild swelling of the legs.     Assessment & Plan:   Principal Problem:   Retroperitoneal mass Active Problems:   Pulmonary embolism (HCC)   FTT (failure to thrive) in adult   Hypercalcemia   History of rectal cancer   History of prostate cancer   COVID-19 virus infection: Recent COVID-19 infection status posttreatment   Acidosis   Lactic acidosis  1 retroperitoneal renal lesions concerning for metastatic cancer -Patient seen in consultation by oncology who have consulted with IR for evaluation for biopsy which was done 12/03/2020 with results pending.  -Continue heparin for now while apixaban on hold.   -We will resume apixaban tomorrow.   -Per oncology.   2.  Recent diagnosis of PE -Apixaban on hold for biopsy which was done.   -Continue heparin for now and likely resume apixaban tomorrow.    3.  History of rectal  cancer and prostate cancer -Being followed by oncology, [name].  4.  Hypercalcemia -Likely hypercalcemia of malignancy. -Ionized calcium pending.   -Corrected calcium of 10.58 -Phosphorus level was at 2.4 and being repleted.   -Magnesium at 1.9.. -Continue gentle hydration.  -Repeat labs in the morning and if no continued improvement may need a trial of bisphosphonates.   -Per oncology.    5.  Recent COVID-19 infection -Status posttreatment with antiviral and steroids. -Asymptomatic. -Sats of 96% on room air.    6.  Failure to thrive -Likely secondary to #1, #3. -Biopsies pending. -Random cortisol at 21.2.  -May need palliative care involvement once biopsies are finalized and if patient felt not a candidate for any further chemotherapy. -We will defer timing of palliative care involvement to oncology. -Patient started on prednisone to stimulate appetite per oncology.  7.  Anion gap metabolic acidosis/lactic acidosis -Questionable etiology.??  Secondary to malignancy -Concern for possible CAP versus aspiration pneumonia. -Urinalysis unremarkable.   -Chest x-ray with interval worsening of right midlung and suprahilar pulmonary opacities since prior study.   -Acidosis fluctuating currently at 16 despite sodium bicarb drip.  -Patient afebrile.  No abdominal pain.  No chest pain.  No significant shortness of breath.   -Patient nontoxic looking. -Lactic acid level persistently > 11. -CT abdomen and pelvis with no acute abnormalities to explain significantly elevated lactic acidosis level. -Patient not in DKA, not uremic, unlikely to have methanol toxicity, unlikely to have salicylate toxicity, no ethylene glycol toxicity. -Medications reviewed by pharmacist  and no medications on his MAR likely to lead to acidosis. -Blood cultures pending. -MRSA PCR has been ordered. -Continue empiric IV vancomycin, IV Rocephin, IV Flagyl. -IV fluids. -Consult with ID for further evaluation  and management.  8.  Oral thrush -Continue fluconazole, nystatin swish and swallow.  9.  Hypophosphatemia -Phosphorus currently at 2.4.   -Continue K-Phos Neutral 250 mg p.o. twice daily. -Repeat labs in the morning.  10.CAP versus aspiration pneumonia -Patient noted to have developed an acidosis. -Chest x-ray done with interval worsening of right midlung and suprahilar pulmonary opacities since prior study. -Patient afebrile -Per wife patient with some coughing episodes when drinking liquids. -Urine pneumococcus antigen negative.   -Urine Legionella antigen negative. -Patient seen by speech therapy and currently on a dysphagia 3 diet.   -Patient for modified barium swallow today. -Continue IV Flagyl, IV Rocephin to cover for CAP versus aspiration pneumonia.     11.  Hypoglycemia -Patient noted to have blood glucose levels of 64 the morning of 12/05/2020. -Likely secondary to poor oral intake. -Blood glucose level at 74 this morning.   -Continue CBG every 6 hours. -Continue bicarb drip with dextrose fluid.   -Follow.   DVT prophylaxis: Heparin Code Status: Full Family Communication: Updated patient, wife, daughter, at bedside. Disposition:   Status is: Inpatient  Remains inpatient appropriate because:Inpatient level of care appropriate due to severity of illness  Dispo: The patient is from: Home              Anticipated d/c is to: Home              Patient currently is not medically stable to d/c.   Difficult to place patient No       Consultants:  Oncology: [name] IR: Dr. Laurence Ferrari 12/02/2020 ID pending  Procedures:  CT-guided core needle biopsy of infiltrative right retroperitoneal/perirenal mass per Dr. Pascal Lux IR 12/03/2020 Chest x-ray 12/03/2020 CT abdomen and pelvis 12/06/2020  Antimicrobials:  IV Rocephin 12/04/2020>>>>  IV Flagyl 12/04/2020>>>> IV vancomycin 12/06/2020>>>>   Subjective: Laying in the bed using a toothpick.  Denies any chest pain.  No  shortness of breath.  No abdominal pain.  Per wife appetite picking up with oral prednisone.  Feeling better.  About to go down for modified barium swallow.  Objective: Vitals:   12/06/20 0423 12/06/20 1457 12/06/20 2053 12/07/20 0523  BP: (!) 109/54 121/61 109/75 104/62  Pulse: 87 97 74 72  Resp: 12 14 16 14   Temp: 97.6 F (36.4 C) 98.2 F (36.8 C) 97.9 F (36.6 C) (!) 97.5 F (36.4 C)  TempSrc: Oral Oral Oral Axillary  SpO2: 94% 96% 96% 94%  Weight:      Height:        Intake/Output Summary (Last 24 hours) at 12/07/2020 1203 Last data filed at 12/07/2020 0900 Gross per 24 hour  Intake 2755.98 ml  Output 4100 ml  Net -1344.02 ml    Filed Weights   12/04/20 0406  Weight: 61.4 kg    Examination:  General exam: NAD Respiratory system: CTA B.  No wheezes, no crackles, no rhonchi.  Normal respiratory effort.  Speaking in full sentences.   Cardiovascular system: Regular rate and rhythm no murmurs rubs or gallops.  No JVD.  No lower extremity edema.   Gastrointestinal system: Abdomen is soft, nontender, nondistended, positive bowel sounds.  Ostomy bag intact with loose stool noted.   Central nervous system: Alert.  No focal neurological deficits.  Extremities: Symmetric 5 x 5  power. Skin: No rashes, lesions or ulcers Psychiatry: Judgement and insight appear fair. Mood & affect appropriate.     Data Reviewed: I have personally reviewed following labs and imaging studies  CBC: Recent Labs  Lab 12/01/20 1715 12/02/20 0247 12/03/20 0657 12/04/20 0201 12/05/20 0546 12/06/20 0558 12/07/20 0459  WBC 4.8   < > 3.6* 3.9* 4.4 4.9 5.7  NEUTROABS 3.0  --   --   --   --   --   --   HGB 14.8   < > 13.4 12.4* 13.3 13.2 13.1  HCT 44.2   < > 39.8 37.3* 39.4 39.9 39.2  MCV 102.1*   < > 102.8* 103.3* 101.3* 103.1* 102.1*  PLT 192   < > 196 159 179 184 185   < > = values in this interval not displayed.     Basic Metabolic Panel: Recent Labs  Lab 12/03/20 0657 12/04/20 0201  12/05/20 0546 12/06/20 0558 12/07/20 0459  NA 135 135 136 136 135  K 3.6 3.3* 3.6 3.3* 3.3*  CL 103 100 99 100 100  CO2 16* 19* 19* 17* 16*  GLUCOSE 63* 80 64* 96 70  BUN 11 12 15 13 11   CREATININE 0.41* 0.62 0.60* 0.66 0.54*  CALCIUM 9.6 9.6 9.6 9.8 9.3  MG 1.7 2.0  --  2.0 1.9  PHOS 2.1* 2.2*  --  2.4* 2.4*     GFR: Estimated Creatinine Clearance: 50.1 mL/min (A) (by C-G formula based on SCr of 0.54 mg/dL (L)).  Liver Function Tests: Recent Labs  Lab 12/01/20 1715 12/03/20 0657 12/04/20 0201 12/06/20 0558 12/07/20 0459  AST 63* 56*  --   --  65*  ALT 32 27  --   --  33  ALKPHOS 87 68  --   --  69  BILITOT 1.2 1.1  --   --  0.7  PROT 7.2 6.0*  --   --  5.6*  ALBUMIN 3.3* 2.7* 2.5* 2.5* 2.4*     CBG: Recent Labs  Lab 12/06/20 1233 12/06/20 1542 12/06/20 1820 12/07/20 0003 12/07/20 0526  GLUCAP 116* 124* 136* 86 74      Recent Results (from the past 240 hour(s))  Culture, Urine     Status: None   Collection Time: 12/03/20  7:00 AM   Specimen: Urine, Catheterized  Result Value Ref Range Status   Specimen Description   Final    URINE, CATHETERIZED Performed at [facility], [place] [address] [place], [place] [address]    Special Requests   Final    NONE Performed at [facility], [place] [address], [place], [place] [address]    Culture   Final    NO GROWTH Performed at [facility], [place] [address] [place], [place] [address]    Report Status 12/05/2020 FINAL  Final  Culture, blood (Routine X 2) w Reflex to ID Panel     Status: None (Preliminary result)   Collection Time: 12/06/20  1:42 PM   Specimen: BLOOD  Result Value Ref Range Status   Specimen Description   Final    BLOOD RIGHT ANTECUBITAL Performed at [facility] [address] [place], [place] [address]    Special Requests   Final    BOTTLES DRAWN AEROBIC ONLY Blood Culture adequate volume Performed at [facility] [place] [address], [place], [place] [address]    Culture   Final    NO GROWTH <  24 HOURS Performed at [facility], [place] [address], [place], [place] [address]    Report Status PENDING  Incomplete  Culture, blood (Routine X 2) w Reflex to ID Panel     Status: None (Preliminary result)   Collection Time: 12/06/20  4:11 PM   Specimen: BLOOD  Result Value Ref Range Status   Specimen Description   Final    BLOOD RIGHT ANTECUBITAL Performed at [facility] [address], [place], [place] [address]    Special Requests   Final    BOTTLES DRAWN AEROBIC AND ANAEROBIC Blood Culture results may not be optimal due to an inadequate volume of blood received in culture bottles Performed at [facility] [address], [place], [place] [address]    Culture   Final    NO GROWTH < 24 HOURS Performed at [facility] [address] [place],  [address]    Report Status PENDING  Incomplete         Radiology Studies: CT ABDOMEN PELVIS W CONTRAST  Result Date: 12/06/2020 CLINICAL DATA:  Sepsis, elevated lactic acidosis EXAM: CT ABDOMEN AND PELVIS WITH CONTRAST TECHNIQUE: Multidetector CT imaging of the abdomen and pelvis was performed using the standard protocol following bolus administration of intravenous contrast. CONTRAST:  186mL OMNIPAQUE IOHEXOL 300 MG/ML  SOLN COMPARISON:  CT 11/26/2020 FINDINGS: Lower chest: Some coarsened reticular interstitial changes noted in the lung bases, not significantly changed from prior. No consolidative process or layering effusion. Cardiac size within normal limits. Coronary artery calcifications. No pericardial effusion. Hepatobiliary: Nodular hepatic surface contour. Caudate and left lobe hypertrophy. No focal liver lesion. Multiple calcified gallstones within the gallbladder. No focal pericholecystic inflammation. No biliary ductal dilatation or intraductal gallstones. Pancreas: No pancreatic ductal  dilatation or surrounding inflammatory changes. Spleen: Normal in size. No concerning splenic lesions. Adrenals/Urinary Tract: Marked thickening of the bilateral adrenal glands measuring up to 2.7 cm on the left and 2.6 cm on the right concerning for metastatic infiltration. Stable appearance of the bilateral kidneys with small fluid attenuation cyst in the interpolar left kidney. Bilateral the urothelial thickening, right greater than left, increasing conspicuity from comparison prior. Mild circumferential bladder wall thickening noted as well. No urolithiasis or frank hydronephrosis. Bulky multinodular masslike soft tissue attenuation is seen throughout the right perinephric and pararenal space and extending into the renal sinus, overall appearance unchanged from comparison. Stomach/Bowel: Hiatal hernia. Distal stomach and duodenum with some questionable mural thickening versus underdistention or peristaltic contraction. No other small bowel thickening or dilatation is seen. Postsurgical changes from prior low anterior resection with a left lower quadrant colostomy in place some mild circumferential thickening of the colonic remnant, similar to prior. Presacral soft tissue attenuation and fluid is also quite similar to comparison. No evidence of bowel obstruction. Extensive colonic diverticulosis without evidence of acute diverticulitis. Vascular/Lymphatic: Extensive atherosclerotic calcification throughout the abdominal aorta and branch vessels. Retroperitoneal adenopathy, along the left periaortic region is again seen measuring up to 2.3 cm short axis, unchanged from prior. Reproductive: Prostate appears surgically absent. Other: Small volume ascites, slightly increasing from comparison prior with some increasing edematous changes of the central mesentery. Presacral soft tissue thickening is stable, described above. No free air. Peristomal herniation of a solitary loop of small bowel at the site of end colostomy  in the left upper quadrant. No resulting obstruction or vascular compromise. Musculoskeletal: No suspicious lytic or blastic lesions. Grade 1 anterolisthesis L4 on 5. Dextrocurvature of the  lumbar spine, apex L5. Multilevel degenerative changes are present in the imaged portions of the spine. IMPRESSION: Bulky multinodular soft tissue density in the right Peri and pararenal spaces concerning for metastatic disease and/or lymphoma. Additional periaortic/retroperitoneal adenopathy and conspicuous nodular thickening of the adrenal glands compatible with additional metastatic deposits. Marked urothelial thickening of the right ureter and renal sinus. More mild changes on the left, could reflect ascending urinary tract infection/pyelitis. Correlate with urinalysis. Increasing abdominal ascites and edematous changes in the mesentery, correlate with fluid status. Background of cirrhotic change.  No focal liver lesion. Mild thickening of the distal stomach and duodenal sweep, can be related to cirrhotic features/portal enteropathy though should correlate for clinical features of gastric duodenitis or other complication. Cholelithiasis without evidence of acute cholecystitis. Postsurgical changes from low anterior resection. Fat and small bowel containing parastomal hernia at the site of end colostomy. Stable thickening along the presacral space. Mild mural thickening of the rectal remnant, nonspecific. Diverticulosis without evidence of acute diverticulitis. Electronically Signed   By: Lovena Le M.D.   On: 12/06/2020 23:40        Scheduled Meds:  feeding supplement  237 mL Oral BID BM   multivitamin with minerals  1 tablet Oral Daily   nystatin  5 mL Oral QID   phosphorus  250 mg Oral BID   predniSONE  20 mg Oral Q breakfast   Continuous Infusions:  cefTRIAXone (ROCEPHIN)  IV 2 g (12/07/20 0942)   heparin 800 Units/hr (12/05/20 6226)   metronidazole 500 mg (12/07/20 0832)   sodium bicarbonate 150 mEq in  D5W infusion 75 mL/hr at 12/07/20 0940   vancomycin       LOS: 6 days    Time spent: 35 minutes    Irine Seal, MD [facility]   To contact the attending provider between 7A-7P or the covering provider during after hours 7P-7A, please log into the web site [email or web address] and access using universal [place] password for that web site. If you do not have the password, please call the hospital operator.  12/07/2020, 12:03 PM

## 2020-12-07 NOTE — Progress Notes (Signed)
  Speech Language Pathology Treatment: Dysphagia  Patient Details Name: Cody Hurley MRN: [id number] DOB: March 31, 1926 Today's Date: 12/07/2020 Time: 1340-1400 SLP Time Calculation (min) (ACUTE ONLY): 20 min  Assessment / Plan / Recommendation Clinical Impression  SLP follow up with patient and his daughter to review results and recommendations from [facility] completed this date.   Pt and dtr were receptive to education using video footage from the study. SLP explained high aspiration risk with any PO intake, and provided education on minimizing bacteria by not allowing used tooth swabs to soak in water, the importance of upright positioning during and 30 minutes after meals, clearing the throat intermittently during meals, etc.  Safe swallow precautions were posted at [facility]. SLP discussed diet recommended (Dys2/NTL) for energy conservation and to minimize aspiration risk.   SLP will continue to follow for assessment of diet tolerance and ongoing education.   HPI HPI: Cody Hurley is a 87 y.o. male with history of rectal cancer last treated on 2017-experiencing increasing weakness and poor appetite.  Patient was diagnosed with COVID infection about 2 weeks ago was treated with antiviral and p.o. prednisone.  Subsequent which patient also had come to the ER on June 26 with shortness of breath and at the time patient CT angiogram showed 2 small pulmonary embolism and was placed on apixaban.  The CT scan also showed retroperitoneal lesions concerning for metastatic cancer.  Patient had subsequently followed with [name] patient's oncologist who ordered a CT abdomen pelvis confirms multiple lesions and since patient has been progressively worsening and finding it difficult to ambulate has poor appetite lost at least 20 to 30 pounds in the last month.  Patient admitted for further work-up.  On exam patient appears generally weak otherwise nonfocal.  Has mild swelling of the legs. Chest x-ray with interval  worsening of right midlung and suprahilar pulmonary opacities since prior study, concern for possible CAP vs aspiration PNA.      SLP Plan  Goals updated       Recommendations  Diet recommendations: Dysphagia 2 (fine chop);Nectar-thick liquid Liquids provided via: Cup;Straw Medication Administration: Crushed with puree Supervision: Patient able to self feed Compensations: Slow rate;Small sips/bites;Follow solids with liquid;Minimize environmental distractions;Clear throat intermittently Postural Changes and/or Swallow Maneuvers: Seated upright 90 degrees;Upright 30-60 min after meal                Oral Care Recommendations: Oral care QID Follow up Recommendations: Home health SLP;Skilled Nursing facility SLP Visit Diagnosis: Dysphagia, pharyngoesophageal phase (R13.14) Plan: Goals updated       Cody Hurley, [facility] [place] Speech Language Pathologist Office: [phone] Pager: [phone]  Shonna Chock 12/07/2020, 2:11 PM

## 2020-12-07 NOTE — Progress Notes (Signed)
Modified Barium Swallow Progress Note  Patient Details  Name: Cody Hurley MRN: [id number] Date of Birth: 1925/06/28  Today's Date: 12/07/2020  Modified Barium Swallow completed.  Full report located under Chart Review in the Imaging Section.  Brief recommendations include the following:  Clinical Impression Pt presents with mild oral, moderate-severe pharyngeal dysphagia, characterized as follows: Orally, pt exhibits poor bolus formation and premature spill. Oral prep is slow and discoordinated. Pharyngeal swallow is characterized by initation of the swallow reflex at the vallecular sinus most of the time, and at the pyriform sinus intermittently. Post-swallow residue is noted within the vallecular sinus, lateral channels, and pyriform sinus, and increases risk of penetration and aspiration during meals. Penetration was noted on nectar thick liquids during and after the swallow. Aspiration of thin liquids was seen during the swallow, due to poor epiglottic inversion and insufficient airway protection. Npo cough response was elicited after aspiration events. Pt was noted to pull vallecular residue back into the oral cavity on several occasions. Esophageal sweep revealed it to be very slow to clear. Chin tuck and effortful swallow were not effective to clear vallecular residue. At this time, pt is at significantly high risk for aspiration with continued PO intake based on performance on today's instrumental study. Recommend dys 2 diet and nectar thick liquids, with meds crushed in puree. Also recommend Palliative Care consult given multiple comorbidities. RN and MD informed. SLP will follow for education and diet tolerance assessment.     Swallow Evaluation Recommendations  Recommended Consults: Consider esophageal assessment   SLP Diet Recommendations: Dysphagia 2 (Fine chop) solids;Nectar thick liquid   Liquid Administration via: Cup;Straw   Medication Administration: Crushed with puree    Supervision: Patient able to self feed   Compensations: Slow rate;Small sips/bites;Follow solids with liquid   Postural Changes: Remain semi-upright after after feeds/meals (Comment);Seated upright at 90 degrees   Oral Care Recommendations: Oral care QID   Other Recommendations: Order thickener from [place]. Quentin Ore, [facility] [place], [place] Speech Language Pathologist Office: [phone] Pager: [phone]  Shonna Chock 12/07/2020,2:07 PM

## 2020-12-07 NOTE — Care Management Important Message (Signed)
Important Message  Patient Details IM Letter placed in Patient's room. Name: Cody Hurley MRN: [id number] Date of Birth: May 07, 1926   Medicare Important Message Given:  Yes     Kerin Salen 12/07/2020, 8:53 AM

## 2020-12-08 DIAGNOSIS — Z7189 Other specified counseling: Secondary | ICD-10-CM

## 2020-12-08 DIAGNOSIS — U071 COVID-19: Secondary | ICD-10-CM | POA: Diagnosis not present

## 2020-12-08 DIAGNOSIS — R1900 Intra-abdominal and pelvic swelling, mass and lump, unspecified site: Secondary | ICD-10-CM | POA: Diagnosis not present

## 2020-12-08 DIAGNOSIS — Z85048 Personal history of other malignant neoplasm of rectum, rectosigmoid junction, and anus: Secondary | ICD-10-CM | POA: Diagnosis not present

## 2020-12-08 DIAGNOSIS — E872 Acidosis: Secondary | ICD-10-CM | POA: Diagnosis not present

## 2020-12-08 DIAGNOSIS — R627 Adult failure to thrive: Secondary | ICD-10-CM | POA: Diagnosis not present

## 2020-12-08 DIAGNOSIS — Z515 Encounter for palliative care: Secondary | ICD-10-CM

## 2020-12-08 LAB — RENAL FUNCTION PANEL
Albumin: 2.6 g/dL — ABNORMAL LOW (ref 3.5–5.0)
Anion gap: 21 — ABNORMAL HIGH (ref 5–15)
BUN: 9 mg/dL (ref 8–23)
CO2: 14 mmol/L — ABNORMAL LOW (ref 22–32)
Calcium: 9.5 mg/dL (ref 8.9–10.3)
Chloride: 102 mmol/L (ref 98–111)
Creatinine, Ser: 0.62 mg/dL (ref 0.61–1.24)
GFR, Estimated: >60 mL/min (ref >60)
Glucose, Bld: 74 mg/dL (ref 70–99)
Phosphorus: 2.4 mg/dL — ABNORMAL LOW (ref 2.5–4.6)
Potassium: 3.3 mmol/L — ABNORMAL LOW (ref 3.5–5.1)
Sodium: 137 mmol/L (ref 135–145)

## 2020-12-08 LAB — DIFFERENTIAL
Abs Immature Granulocytes: 0.08 10*3/uL — ABNORMAL HIGH (ref 0.00–0.07)
Basophils Absolute: 0.1 10*3/uL (ref 0.0–0.1)
Basophils Relative: 1 %
Eosinophils Absolute: 0.2 10*3/uL (ref 0.0–0.5)
Eosinophils Relative: 3 %
Immature Granulocytes: 1 %
Lymphocytes Relative: 13 %
Lymphs Abs: 0.8 10*3/uL (ref 0.7–4.0)
Monocytes Absolute: 0.9 10*3/uL (ref 0.1–1.0)
Monocytes Relative: 15 %
Neutro Abs: 4 10*3/uL (ref 1.7–7.7)
Neutrophils Relative %: 67 %

## 2020-12-08 LAB — CREATININE, SERUM
Creatinine, Ser: 0.5 mg/dL — ABNORMAL LOW (ref 0.61–1.24)
GFR, Estimated: >60 mL/min (ref >60)

## 2020-12-08 LAB — HEPARIN LEVEL (UNFRACTIONATED): Heparin Unfractionated: 0.33 IU/mL (ref 0.30–0.70)

## 2020-12-08 LAB — CBC
HCT: 40.2 % (ref 39.0–52.0)
Hemoglobin: 13.1 g/dL (ref 13.0–17.0)
MCH: 34 pg (ref 26.0–34.0)
MCHC: 32.6 g/dL (ref 30.0–36.0)
MCV: 104.4 fL — ABNORMAL HIGH (ref 80.0–100.0)
Platelets: 153 10*3/uL (ref 150–400)
RBC: 3.85 MIL/uL — ABNORMAL LOW (ref 4.22–5.81)
RDW: 17.3 % — ABNORMAL HIGH (ref 11.5–15.5)
WBC: 6 10*3/uL (ref 4.0–10.5)
nRBC: 0 % (ref 0.0–0.2)

## 2020-12-08 LAB — URINE CULTURE: Culture: NO GROWTH

## 2020-12-08 LAB — URIC ACID: Uric Acid, Serum: 5.6 mg/dL (ref 3.7–8.6)

## 2020-12-08 LAB — SURGICAL PATHOLOGY

## 2020-12-08 LAB — GLUCOSE, CAPILLARY
Glucose-Capillary: 70 mg/dL (ref 70–99)
Glucose-Capillary: 85 mg/dL (ref 70–99)
Glucose-Capillary: 126 mg/dL — ABNORMAL HIGH (ref 70–99)
Glucose-Capillary: 91 mg/dL (ref 70–99)

## 2020-12-08 LAB — LACTATE DEHYDROGENASE: LDH: 440 U/L — ABNORMAL HIGH (ref 98–192)

## 2020-12-08 LAB — MAGNESIUM: Magnesium: 1.9 mg/dL (ref 1.7–2.4)

## 2020-12-08 LAB — PROCALCITONIN: Procalcitonin: <0.1 ng/mL

## 2020-12-08 MED ORDER — POTASSIUM CHLORIDE CRYS ER 20 MEQ PO TBCR
40.0000 meq | EXTENDED_RELEASE_TABLET | Freq: Once | ORAL | Status: AC
  Administered 2020-12-08: 40 meq via ORAL
  Filled 2020-12-08: qty 2

## 2020-12-08 MED ORDER — SODIUM CHLORIDE 0.9 % IV SOLN
2.0000 g | INTRAVENOUS | Status: AC
  Administered 2020-12-08: 2 g via INTRAVENOUS
  Filled 2020-12-08: qty 20

## 2020-12-08 MED ORDER — APIXABAN 5 MG PO TABS
5.0000 mg | ORAL_TABLET | Freq: Two times a day (BID) | ORAL | Status: DC
  Administered 2020-12-08 – 2020-12-18 (×21): 5 mg via ORAL
  Filled 2020-12-08 (×21): qty 1

## 2020-12-08 MED ORDER — SODIUM BICARBONATE 650 MG PO TABS
1300.0000 mg | ORAL_TABLET | Freq: Three times a day (TID) | ORAL | Status: DC
  Administered 2020-12-08 – 2020-12-12 (×13): 1300 mg via ORAL
  Filled 2020-12-08 (×13): qty 2

## 2020-12-08 MED ORDER — MAGIC MOUTHWASH W/LIDOCAINE
5.0000 mL | Freq: Four times a day (QID) | ORAL | Status: DC | PRN
  Filled 2020-12-08: qty 5

## 2020-12-08 MED ORDER — ALLOPURINOL 300 MG PO TABS
300.0000 mg | ORAL_TABLET | Freq: Every day | ORAL | Status: DC
  Administered 2020-12-08 – 2020-12-16 (×9): 300 mg via ORAL
  Filled 2020-12-08 (×9): qty 1

## 2020-12-08 NOTE — Progress Notes (Signed)
[facility] for IV heparin (PTA apixaban) Indication: Recent Hx PE  Allergies  Allergen Reactions   Penicillin G Rash    Patient Measurements: Height: 5\' 7"  (170.2 cm) Weight: 61.4 kg (135 lb 6.4 oz) IBW/kg (Calculated) : 66.1 Heparin Dosing Weight: TBW  Vital Signs: BP: 111/68 (07/12 0553) Pulse Rate: 94 (07/12 0553)  Labs: Recent Labs    12/06/20 0558 12/07/20 0459 12/08/20 0508  HGB 13.2 13.1 13.1  HCT 39.9 39.2 40.2  PLT 184 185 153  HEPARINUNFRC 0.42 0.37 0.33  CREATININE 0.66 0.54* 0.62  0.50*     Estimated Creatinine Clearance: 50.1 mL/min (A) (by C-G formula based on SCr of 0.5 mg/dL (L)).   Medical History: Past Medical History:  Diagnosis Date   Colorectal cancer ([place]) 2016   Prostate cancer ([place])     Medications:  Medications Prior to Admission  Medication Sig Dispense Refill Last Dose   acetaminophen (TYLENOL) 500 MG tablet Take 500-1,000 mg by mouth every 6 (six) hours as needed for mild pain (or headaches).   unk   APIXABAN (ELIQUIS) VTE STARTER PACK (10MG  AND 5MG ) Take as directed on package: start with two-5mg  tablets twice daily for 7 days. On day 8, switch to one-5mg  tablet twice daily. (Patient taking differently: Take 5-10 mg by mouth See admin instructions. Take 10 mg by mouth two times a day for 7 days, then decrease to 5 mg two times a day on "day 8") 1 each 0 12/01/2020 at 0730   Cholecalciferol (VITAMIN D-3) 25 MCG (1000 UT) CAPS Take 2,000 Units by mouth daily with breakfast.   12/01/2020 at am   Multiple Vitamin (MULTIVITAMIN) tablet Take 1 tablet by mouth daily.   12/01/2020   oxybutynin (DITROPAN) 5 MG tablet Take 5 mg by mouth daily.   12/01/2020 at am   vitamin B-12 (CYANOCOBALAMIN) 100 MCG tablet Take 100 mcg by mouth daily.   12/01/2020   Scheduled:   allopurinol  300 mg Oral Daily   feeding supplement  237 mL Oral BID BM   food thickener  1 packet Oral TID WC, HS, 0200   multivitamin with minerals  1  tablet Oral Daily   nystatin  5 mL Oral QID   phosphorus  250 mg Oral BID   potassium chloride  40 mEq Oral Once   predniSONE  20 mg Oral Q breakfast   sodium bicarbonate  1,300 mg Oral TID   Infusions:   cefTRIAXone (ROCEPHIN)  IV Stopped (12/07/20 1012)   heparin 800 Units/hr (12/08/20 0622)   metronidazole Stopped (12/08/20 0206)   Assessment: 30 yoM with PMH rectal cancer, recently started on Eliquis for PE 11/22/20, admitted directly from [facility] for inpatient workup for possible metastatic malignancy. Eliquis held for retroperitoneal/renal Bx, with Pharmacy to dose heparin in its place.  Baseline aPTT slightly elevated; baseline heparin level markedly elevated as expected with recent DOAC Prior anticoagulation: Eliquis 10 mg bid, transitioned to 5 mg BID today 7/5 with last dose at 0730  Significant events: 7/7: Bx of R peri-renal mass  Today, 12/08/2020: Daily Heparin level therapeutic (0.33) on 800 units/hr CBC: stable, wnl  No bleeding or issues to report  Goal of Therapy: Heparin level 0.3-0.7 units/ml Monitor platelets by anticoagulation protocol: Yes  Plan: Continue IV heparin infusion at 800 units/hr  Daily CBC and heparin level Monitor for signs of bleeding or thrombosis F/u for ability to resume Eliquis  Dimple Nanas, PharmD 12/08/2020 8:45 AM

## 2020-12-08 NOTE — Progress Notes (Signed)
PROGRESS NOTE    Cody Hurley  VEL:[id number] DOB: [birth year] DOA: 12/01/2020 PCP: [name], MD    No chief complaint on file.   Brief Narrative:  HPI per [name] is a 87 y.o. male with history of rectal cancer last treated on 2017-experiencing increasing weakness and poor appetite.  Patient was diagnosed with COVID infection about 2 weeks ago was treated with antiviral and p.o. prednisone.  Subsequent which patient also had come to the ER on June 26 with shortness of breath and at the time patient CT angiogram showed 2 small pulmonary embolism and was placed on apixaban.  The CT scan also showed retroperitoneal lesions concerning for metastatic cancer.  Patient had subsequently followed with [name] patient's oncologist who ordered a CT abdomen pelvis confirms multiple lesions and since patient has been progressively worsening and finding it difficult to ambulate has poor appetite lost at least 20 to 30 pounds in the last month.  Patient admitted for further work-up.  On exam patient appears generally weak otherwise nonfocal.  Has mild swelling of the legs.     Assessment & Plan:   Principal Problem:   Retroperitoneal mass Active Problems:   Pulmonary embolism (HCC)   FTT (failure to thrive) in adult   Hypercalcemia   History of rectal cancer   History of prostate cancer   COVID-19 virus infection: Recent COVID-19 infection status posttreatment   Acidosis   Lactic acidosis  1 retroperitoneal renal lesions concerning for metastatic cancer -Patient seen in consultation by oncology who have consulted with IR for evaluation for biopsy which was done 12/03/2020 with results consistent with aggressive B-cell lymphoma. -Oncology following have discussed results with patient and patient to undergo systemic therapy with chemotherapy starting tomorrow 12/09/2020 per oncology. -We will resume patient's apixaban. -Per oncology..   2.  Recent diagnosis of  PE -Apixaban on hold for biopsy which was done.   -Transition from heparin back to apixaban.    3.  History of rectal cancer and prostate cancer -Being followed by oncology, [name].  4.  Hypercalcemia -Likely hypercalcemia of malignancy.  -Corrected calcium of 10.62 -Phosphorus level was at 2.4 and being repleted.   -Magnesium at 1.9.. -Saline lock IV fluids. -Repeat labs in the morning and if no continued improvement may need a trial of bisphosphonates.   -Per oncology.    5.  Recent COVID-19 infection -Status posttreatment with antiviral and steroids. -Asymptomatic. -Sats of 96% on room air.    6.  Failure to thrive -Likely secondary to #1, #3. -Random cortisol are 21.2. -Biopsies consistent with aggressive B-cell lymphoma. -Appetite improving on prednisone. -Palliative care consultation pending.  7.  Anion gap metabolic acidosis/lactic acidosis -Questionable etiology.  Likely secondary to malignancy -Concern for possible CAP versus aspiration pneumonia. -Urinalysis unremarkable.   -Chest x-ray with interval worsening of right midlung and suprahilar pulmonary opacities since prior study.   -Acidosis fluctuating currently at 14 despite sodium bicarb drip. -Patient afebrile and nontoxic looking. -Lactic acid level persistently > 11. -CT abdomen and pelvis with no acute abnormalities to explain significantly elevated lactic acidosis level. -Patient not in DKA, not uremic, unlikely to have methanol toxicity, unlikely to have salicylate toxicity, no ethylene glycol toxicity. -Medications reviewed by pharmacist and no medications on his MAR likely to lead to acidosis. -Blood cultures pending with no growth to date. -MRSA PCR negative.   -Patient seen in consultation by ID who feel lactic acidosis likely secondary to malignancy.   -  IV vancomycin has been discontinued.   -Discontinue IV Rocephin and IV Flagyl after today's doses as patient would have had 5 days worth of  antibiotics.   -Appreciate ID input and recommendations.   8.  Oral thrush -Improved clinically.  Fluconazole has been discontinued. -Discontinue nystatin swish and swallow after today's doses.   9.  Hypophosphatemia -Phosphorus currently at 2.4.   -Continue K-Phos Neutral 250 mg p.o. twice daily. -Repeat labs in the morning.  10.CAP versus aspiration pneumonia -Patient noted to have developed an acidosis. -Chest x-ray done with interval worsening of right midlung and suprahilar pulmonary opacities since prior study. -Patient afebrile -Per wife patient with some coughing episodes when drinking liquids. -Urine pneumococcus antigen negative.   -Urine Legionella antigen negative. -Patient seen by speech therapy and currently on a dysphagia 3 diet.   -Patient s/p modified barium swallow. -Continue IV Flagyl, IV Rocephin day 5/5 and discontinue antibiotics after today's doses.    11.  Hypoglycemia -Patient noted to have blood glucose levels of 64 the morning of 12/05/2020. -Likely secondary to poor oral intake. -Patient was on bicarb drip with dextrose fluids with improvement with CBGs.   -Discontinue bicarb drip.   -Change CBGs to before every meal and at bedtime.     DVT prophylaxis: Heparin>>> Eliquis Code Status: Full Family Communication: Updated patient, wife, daughter, at bedside. Disposition:   Status is: Inpatient  Remains inpatient appropriate because:Inpatient level of care appropriate due to severity of illness  Dispo: The patient is from: Home              Anticipated d/c is to: Home              Patient currently is not medically stable to d/c.   Difficult to place patient No       Consultants:  Oncology: [name] IR: Dr. Laurence Ferrari 12/02/2020 ID: Dr. Baxter Flattery 12/07/2020  Procedures:  CT-guided core needle biopsy of infiltrative right retroperitoneal/perirenal mass per Dr. Pascal Lux IR 12/03/2020 Chest x-ray 12/03/2020 CT abdomen and pelvis 12/06/2020,    Antimicrobials:  IV Rocephin 12/04/2020>>>> 12/08/2020 IV Flagyl 12/04/2020>>>> 12/08/2020 IV vancomycin 12/06/2020>>>> 12/07/2020   Subjective: Sitting up in the side of the bed.  Appetite improving.  No chest pain.  No shortness of breath.  No abdominal pain.  Wife and daughter at bedside.    Objective: Vitals:   12/07/20 0523 12/07/20 1421 12/07/20 2039 12/08/20 0553  BP: 104/62 120/69 (!) 115/58 111/68  Pulse: 72 96 81 94  Resp: 14 18 16 18   Temp: (!) 97.5 F (36.4 C) 97.8 F (36.6 C) 97.7 F (36.5 C)   TempSrc: Axillary Oral Oral   SpO2: 94% 96% 95% 92%  Weight:      Height:        Intake/Output Summary (Last 24 hours) at 12/08/2020 1305 Last data filed at 12/08/2020 1100 Gross per 24 hour  Intake 3680.22 ml  Output 1850 ml  Net 1830.22 ml    Filed Weights   12/04/20 0406  Weight: 61.4 kg    Examination:  General exam: NAD Respiratory system: Lungs clear to auscultation bilaterally.  No wheezes, no crackles, no rhonchi.  Normal respiratory effort.   Cardiovascular system: Regular rate rhythm no murmurs rubs or gallops.  No JVD.  1+ bilateral lower extremity edema.  Gastrointestinal system: Abdomen is soft, nontender, nondistended, positive bowel sounds.  Ostomy bag intact with loose stool.   Central nervous system: Alert.  Oriented.  Moving extremities spontaneously.  No focal  neurological deficits.  Extremities: Symmetric 5 x 5 power. Skin: No rashes, lesions or ulcers Psychiatry: Judgement and insight appear fair. Mood & affect appropriate.     Data Reviewed: I have personally reviewed following labs and imaging studies  CBC: Recent Labs  Lab 12/01/20 1715 12/02/20 0247 12/04/20 0201 12/05/20 0546 12/06/20 0558 12/07/20 0459 12/08/20 0501 12/08/20 0508  WBC 4.8   < > 3.9* 4.4 4.9 5.7  --  6.0  NEUTROABS 3.0  --   --   --   --   --  4.0  --   HGB 14.8   < > 12.4* 13.3 13.2 13.1  --  13.1  HCT 44.2   < > 37.3* 39.4 39.9 39.2  --  40.2  MCV 102.1*   <  > 103.3* 101.3* 103.1* 102.1*  --  104.4*  PLT 192   < > 159 179 184 185  --  153   < > = values in this interval not displayed.     Basic Metabolic Panel: Recent Labs  Lab 12/03/20 0657 12/04/20 0201 12/05/20 0546 12/06/20 0558 12/07/20 0459 12/08/20 0508  NA 135 135 136 136 135 137  K 3.6 3.3* 3.6 3.3* 3.3* 3.3*  CL 103 100 99 100 100 102  CO2 16* 19* 19* 17* 16* 14*  GLUCOSE 63* 80 64* 96 70 74  BUN 11 12 15 13 11 9   CREATININE 0.41* 0.62 0.60* 0.66 0.54* 0.62  0.50*  CALCIUM 9.6 9.6 9.6 9.8 9.3 9.5  MG 1.7 2.0  --  2.0 1.9 1.9  PHOS 2.1* 2.2*  --  2.4* 2.4* 2.4*     GFR: Estimated Creatinine Clearance: 50.1 mL/min (A) (by C-G formula based on SCr of 0.5 mg/dL (L)).  Liver Function Tests: Recent Labs  Lab 12/01/20 1715 12/03/20 0657 12/04/20 0201 12/06/20 0558 12/07/20 0459 12/08/20 0508  AST 63* 56*  --   --  65*  --   ALT 32 27  --   --  33  --   ALKPHOS 87 68  --   --  69  --   BILITOT 1.2 1.1  --   --  0.7  --   PROT 7.2 6.0*  --   --  5.6*  --   ALBUMIN 3.3* 2.7* 2.5* 2.5* 2.4* 2.6*     CBG: Recent Labs  Lab 12/07/20 1202 12/07/20 1757 12/07/20 2323 12/08/20 0555 12/08/20 1144  GLUCAP 138* 125* 87 70 85      Recent Results (from the past 240 hour(s))  Culture, Urine     Status: None   Collection Time: 12/03/20  7:00 AM   Specimen: Urine, Catheterized  Result Value Ref Range Status   Specimen Description   Final    URINE, CATHETERIZED Performed at [facility], [place] [address], [place], [place] [address]    Special Requests   Final    NONE Performed at [facility], [place] [address], [place], [place] [address]    Culture   Final    NO GROWTH Performed at [facility], [place] [address] [place], [place] [address]    Report Status 12/05/2020 FINAL  Final  Culture, blood (Routine X 2) w Reflex to ID Panel     Status: None (Preliminary result)   Collection Time: 12/06/20  1:42 PM   Specimen:  BLOOD  Result Value Ref Range Status   Specimen Description   Final    BLOOD RIGHT ANTECUBITAL  Performed at [facility], [place] [address] [place], [place] [address]    Special Requests   Final    BOTTLES DRAWN AEROBIC ONLY Blood Culture adequate volume Performed at [place] [address] [place], [place] [address]    Culture   Final    NO GROWTH 2 DAYS Performed at [place] [address] [place], [place] [address]    Report Status PENDING  Incomplete  Culture, blood (Routine X 2) w Reflex to ID Panel     Status: None (Preliminary result)   Collection Time: 12/06/20  4:11 PM   Specimen: BLOOD  Result Value Ref Range Status   Specimen Description   Final    BLOOD RIGHT ANTECUBITAL Performed at [place] [address] [place], [place] [address]    Special Requests   Final    BOTTLES DRAWN AEROBIC AND ANAEROBIC Blood Culture results may not be optimal due to an inadequate volume of blood received in culture bottles Performed at [place] [address], [place], [place] [address]    Culture   Final    NO GROWTH 2 DAYS Performed at [place] [address], [place], [place] [address]    Report Status PENDING  Incomplete  Culture, Urine     Status: None   Collection Time: 12/07/20 10:17 AM   Specimen: Urine, Clean Catch  Result Value Ref Range Status   Specimen Description   Final    URINE, CLEAN CATCH Performed at [facility], [place] [address] [place], [place] [address]    Special Requests   Final    NONE Performed at [facility], [place] [address], [place], [place] [address]    Culture   Final    NO GROWTH Performed at [facility], [place] [address], [place], [place] [address]    Report Status 12/08/2020 FINAL  Final  MRSA Next Gen by PCR, Nasal     Status: None   Collection Time: 12/07/20 11:00 AM   Specimen: Nasal  Mucosa; Nasal Swab  Result Value Ref Range Status   MRSA by PCR Next Gen NOT DETECTED NOT DETECTED Final    Comment: (NOTE) The GeneXpert MRSA Assay (FDA approved for NASAL specimens only), is one component of a comprehensive MRSA colonization surveillance program. It is not intended to diagnose MRSA infection nor to guide or monitor treatment for MRSA infections. Test performance is not FDA approved in patients less than 32 years old. Performed at [facility], [place] [address] [place], [place] [address]          Radiology Studies: CT ABDOMEN PELVIS W CONTRAST  Result Date: 12/06/2020 CLINICAL DATA:  Sepsis, elevated lactic acidosis EXAM: CT ABDOMEN AND PELVIS WITH CONTRAST TECHNIQUE: Multidetector CT imaging of the abdomen and pelvis was performed using the standard protocol following bolus administration of intravenous contrast. CONTRAST:  178mL OMNIPAQUE IOHEXOL 300 MG/ML  SOLN COMPARISON:  CT 11/26/2020 FINDINGS: Lower chest: Some coarsened reticular interstitial changes noted in the lung bases, not significantly changed from prior. No consolidative process or layering effusion. Cardiac size within normal limits. Coronary artery calcifications. No pericardial effusion. Hepatobiliary: Nodular hepatic surface contour. Caudate and left lobe hypertrophy. No focal liver lesion. Multiple calcified gallstones within the gallbladder. No focal pericholecystic inflammation. No biliary ductal dilatation or intraductal gallstones. Pancreas: No pancreatic ductal dilatation or surrounding inflammatory changes. Spleen: Normal in size. No  concerning splenic lesions. Adrenals/Urinary Tract: Marked thickening of the bilateral adrenal glands measuring up to 2.7 cm on the left and 2.6 cm on the right concerning for metastatic infiltration. Stable appearance of the bilateral kidneys with small fluid attenuation cyst in the interpolar left kidney. Bilateral the urothelial thickening, right  greater than left, increasing conspicuity from comparison prior. Mild circumferential bladder wall thickening noted as well. No urolithiasis or frank hydronephrosis. Bulky multinodular masslike soft tissue attenuation is seen throughout the right perinephric and pararenal space and extending into the renal sinus, overall appearance unchanged from comparison. Stomach/Bowel: Hiatal hernia. Distal stomach and duodenum with some questionable mural thickening versus underdistention or peristaltic contraction. No other small bowel thickening or dilatation is seen. Postsurgical changes from prior low anterior resection with a left lower quadrant colostomy in place some mild circumferential thickening of the colonic remnant, similar to prior. Presacral soft tissue attenuation and fluid is also quite similar to comparison. No evidence of bowel obstruction. Extensive colonic diverticulosis without evidence of acute diverticulitis. Vascular/Lymphatic: Extensive atherosclerotic calcification throughout the abdominal aorta and branch vessels. Retroperitoneal adenopathy, along the left periaortic region is again seen measuring up to 2.3 cm short axis, unchanged from prior. Reproductive: Prostate appears surgically absent. Other: Small volume ascites, slightly increasing from comparison prior with some increasing edematous changes of the central mesentery. Presacral soft tissue thickening is stable, described above. No free air. Peristomal herniation of a solitary loop of small bowel at the site of end colostomy in the left upper quadrant. No resulting obstruction or vascular compromise. Musculoskeletal: No suspicious lytic or blastic lesions. Grade 1 anterolisthesis L4 on 5. Dextrocurvature of the lumbar spine, apex L5. Multilevel degenerative changes are present in the imaged portions of the spine. IMPRESSION: Bulky multinodular soft tissue density in the right Peri and pararenal spaces concerning for metastatic disease and/or  lymphoma. Additional periaortic/retroperitoneal adenopathy and conspicuous nodular thickening of the adrenal glands compatible with additional metastatic deposits. Marked urothelial thickening of the right ureter and renal sinus. More mild changes on the left, could reflect ascending urinary tract infection/pyelitis. Correlate with urinalysis. Increasing abdominal ascites and edematous changes in the mesentery, correlate with fluid status. Background of cirrhotic change.  No focal liver lesion. Mild thickening of the distal stomach and duodenal sweep, can be related to cirrhotic features/portal enteropathy though should correlate for clinical features of gastric duodenitis or other complication. Cholelithiasis without evidence of acute cholecystitis. Postsurgical changes from low anterior resection. Fat and small bowel containing parastomal hernia at the site of end colostomy. Stable thickening along the presacral space. Mild mural thickening of the rectal remnant, nonspecific. Diverticulosis without evidence of acute diverticulitis. Electronically Signed   By: Lovena Le M.D.   On: 12/06/2020 23:40   DG Swallowing Func-Speech Pathology  Result Date: 12/07/2020 Formatting of this result is different from the original. Objective Swallowing Evaluation: Type of Study: MBS-Modified Barium Swallow Study  Patient Details Name: Cody Hurley MRN: [id number] Date of Birth: December 12, 1925 Today's Date: 12/07/2020 Time: SLP Start Time (ACUTE ONLY): 1210 -SLP Stop Time (ACUTE ONLY): 1235 SLP Time Calculation (min) (ACUTE ONLY): 25 min Past Medical History: Past Medical History: Diagnosis Date  Colorectal cancer ([place]) 2016  Prostate cancer [facility])  Past Surgical History: Past Surgical History: Procedure Laterality Date  COLOSTOMY    MELANOMA EXCISION    ear  PROSTATE SURGERY  1991 HPI: Cody Hurley is a 87 y.o. male with history of rectal cancer last treated on 2017-experiencing increasing weakness and poor appetite.  Patient was  diagnosed with COVID infection about 2 weeks ago was treated with antiviral and p.o. prednisone.  Subsequent which patient also had come to the ER on June 26 with shortness of breath and at the time patient CT angiogram showed 2 small pulmonary embolism and was placed on apixaban.  The CT scan also showed retroperitoneal lesions concerning for metastatic cancer.  Patient had subsequently followed with [name] patient's oncologist who ordered a CT abdomen pelvis confirms multiple lesions and since patient has been progressively worsening and finding it difficult to ambulate has poor appetite lost at least 20 to 30 pounds in the last month.  Patient admitted for further work-up.  On exam patient appears generally weak otherwise nonfocal.  Has mild swelling of the legs. Chest x-ray with interval worsening of right midlung and suprahilar pulmonary opacities since prior study, concern for possible CAP vs aspiration PNA.  Subjective: Pt seen in radiology for instrumental assessment of swallow function and safety Assessment / Plan / Recommendation CHL IP CLINICAL IMPRESSIONS 12/07/2020 Clinical Impression Pt presents with mild oral, moderate-severe pharyngeal dysphagia, characterized as follows: Orally, pt exhibits poor bolus formation and premature spill. Oral prep is slow and discoordinated. Pharyngeal swallow is characterized by initation of the swallow reflex at the vallecular sinus most of the time, and at the pyriform sinus intermittently. Post-swallow residue is noted within the vallecular sinus, lateral channels, and pyriform sinus, and increases risk of penetration and aspiration during meals. Penetration was noted on nectar thick liquids during and after the swallow. Aspiration of thin liquids was seen during the swallow, due to poor epiglottic inversion and insufficient airway protection. Npo cough response was elicited after aspiration events. Pt was noted to pull vallecular residue back into the oral  cavity on several occasions. Esophageal sweep revealed it to be very slow to clear. Chin tuck and effortful swallow were not effective to clear vallecular residue. At this time, pt is at significantly high risk for aspiration with continued PO intake based on performance on today's instrumental study. Recommend dys 2 diet and nectar thick liquids, with meds crushed in puree. Also recommend Palliative Care consult given multiple comorbidities. RN and MD informed. SLP will follow for education and diet tolerance assessment.  SLP Visit Diagnosis Dysphagia, pharyngoesophageal phase (R13.14)     Impact on safety and function Severe aspiration risk;Risk for inadequate nutrition/hydration   CHL IP TREATMENT RECOMMENDATION 12/07/2020 Treatment Recommendations Therapy as outlined in treatment plan below   Prognosis 12/07/2020 Prognosis for Safe Diet Advancement Fair     CHL IP DIET RECOMMENDATION 12/07/2020 SLP Diet Recommendations Dysphagia 2 (Fine chop) solids;Nectar thick liquid Liquid Administration via Cup;Straw Medication Administration Crushed with puree Compensations Slow rate;Small sips/bites;Follow solids with liquid Postural Changes Remain semi-upright after after feeds/meals (Comment);Seated upright at 90 degrees   CHL IP OTHER RECOMMENDATIONS 12/07/2020 Recommended Consults Consider esophageal assessment Oral Care Recommendations Oral care QID Other Recommendations Order thickener from pharmacy   CHL IP FOLLOW UP RECOMMENDATIONS 12/07/2020 Follow up Recommendations Home health SLP;Skilled Nursing facility   [facility] IP FREQUENCY AND DURATION 12/07/2020 Speech Therapy Frequency (ACUTE ONLY) min 1 x/week Treatment Duration 1 week;2 weeks      CHL IP ORAL PHASE 12/07/2020 Oral Phase Impaired   CHL IP PHARYNGEAL PHASE 12/07/2020 Pharyngeal Phase Impaired   CHL IP CERVICAL ESOPHAGEAL PHASE 12/07/2020 Cervical Esophageal Phase Impaired   Celia B. Quentin Ore, [facility], [place] Speech Language Pathologist Office: [phone] Pager:  [phone] Shonna Chock 12/07/2020, 2:05 PM  Scheduled Meds:  allopurinol  300 mg Oral Daily   feeding supplement  237 mL Oral BID BM   food thickener  1 packet Oral TID WC, HS, 0200   multivitamin with minerals  1 tablet Oral Daily   nystatin  5 mL Oral QID   phosphorus  250 mg Oral BID   predniSONE  20 mg Oral Q breakfast   sodium bicarbonate  1,300 mg Oral TID   Continuous Infusions:  heparin 800 Units/hr (12/08/20 0622)   metronidazole 500 mg (12/08/20 0955)     LOS: 7 days    Time spent: 35 minutes    Irine Seal, MD [facility]   To contact the attending provider between 7A-7P or the covering provider during after hours 7P-7A, please log into the web site [email or web address] and access using universal [place] password for that web site. If you do not have the password, please call the hospital operator.  12/08/2020, 1:05 PM

## 2020-12-08 NOTE — Progress Notes (Addendum)
Speech Language Pathology Treatment: Dysphagia  Patient Details Name: Cody Hurley MRN: [id number] DOB: [birth year] Today's Date: 12/08/2020 Time: 9211-9417 SLP Time Calculation (min) (ACUTE ONLY): 11 min  Assessment / Plan / Recommendation Clinical Impression  Pt's hearing loss impairs communication but he compensates well by informing speaker that he did not hear.  Pt is now on Magic Mouthwash for his oral pain and his wife reports this has helped.   Per discussion with wife, pt has been having some coughing and expectorating with intake over the last year, which has remained stable.  He has experienced weight loss - which has been exacerbated since COVID.     As wife is pt's caregiver at home, reviewed MBS with her -showing her a normal MBS study compared to pt's study especially noting pt's SILENT aspiration and pharyngeal retention along with purposes of compensation strategies.    Given pt's weight loss prior to admission, premorbid dysphagia and pt's current level of dysphagia, pt's ability to maintain nutrition is concerning, especially if demands increase.   Observed pt consume single straw bolus of nectar thick liquid followed by delayed subtle coughing. Encouraged pt to cough and expectorate if he is coughing after swallowing - due to his silent aspiration hx.  As aspiration sensation is amount dependent, thus suspect larger amount of aspiration providing pt's sensation if coughing post-swallow.  He benefited from moderate verbal/visual cues to produce - and made excellent effort.  Provided new swallow precaution signs for pt/family education re: recommendations.     If pt is displeased with nectar liquids and risk for dehydration is present, recommend consider allowing pt thin water 30 minutes after meals and after oral care to maximize hydration, improve pt QOL while minimizing aspiration, aspiration pna risk.   Note plan for palliative meeting tomorrow with [facility] per pt's wife, Derl Barrow.     Recommend consider pt have RMST - Respiratory Muscle Strength Training to improve cough strength, expectoration ability given his dysphagia and pt's motivation *per wife.  Improved cough strength will improve tolerance of probable low grade chronic aspiration.  Will follow.   HPI HPI: Cody Hurley is a 87 y.o. male with history of rectal cancer last treated on 2017-experiencing increasing weakness and poor appetite.  Patient was diagnosed with COVID infection about 2 weeks ago was treated with antiviral and p.o. prednisone.  Subsequent which patient also had come to the ER on June 26 with shortness of breath and at the time patient CT angiogram showed 2 small pulmonary embolism and was placed on apixaban.  The CT scan also showed retroperitoneal lesions concerning for metastatic cancer.  Patient had subsequently followed with [name] patient's oncologist who ordered a CT abdomen pelvis confirms multiple lesions and since patient has been progressively worsening and finding it difficult to ambulate has poor appetite lost at least 20 to 30 pounds in the last month.  Patient admitted for further work-up.  On exam patient appears generally weak otherwise nonfocal.  Has mild swelling of the legs. Chest x-ray with interval worsening of right midlung and suprahilar pulmonary opacities since prior study, concern for possible CAP vs aspiration PNA.  Follow up for education regarding testing, recommended compensations re: pt's swallowing function and to establish premorbid status re: swallowing. Marland Kitchen      SLP Plan  Goals updated       Recommendations  Diet recommendations: Dysphagia 2 (fine chop);Nectar-thick liquid Liquids provided via: Cup;Straw Medication Administration: Crushed with puree Supervision: Patient able to self feed Compensations: Slow  rate;Small sips/bites;Follow solids with liquid;Minimize environmental distractions;Clear throat intermittently Postural Changes and/or Swallow Maneuvers:  Seated upright 90 degrees;Upright 30-60 min after meal (coug and expectorate if pt reflexively coughing)                Oral Care Recommendations: Oral care QID Follow up Recommendations: Home health SLP;Skilled Nursing facility SLP Visit Diagnosis: Dysphagia, pharyngoesophageal phase (R13.14);Dysphagia, oropharyngeal phase (R13.12) Plan: Goals updated       GO              Kathleen Lime, MS [facility] SLP Acute Rehab Services Office [phone] Pager [phone]   Macario Golds 12/08/2020, 5:33 PM

## 2020-12-08 NOTE — Progress Notes (Signed)
Patient colostomy bag changed with patients home supplies. Colostomy clean dry and intact. Stoma color is pink.

## 2020-12-08 NOTE — Progress Notes (Addendum)
IP PROGRESS NOTE  Subjective:   [name] reports feeling better.  His appetite has improved.  He still has some discomfort in his mouth.  Objective: Vital signs in last 24 hours: Blood pressure 111/68, pulse 94, temperature 97.7 F (36.5 C), temperature source Oral, resp. rate 18, height 5\' 7"  (1.702 m), weight 61.4 kg, SpO2 92 %.  Intake/Output from previous day: 07/11 0701 - 07/12 0700 In: 3680.2 [P.O.:605; I.V.:2675.2; IV Piggyback:400] Out: 1750 [Urine:1750]  Physical Exam:  HEENT: Dryness of the tongue and buccal mucosa, no thrush Abdomen: No hepatosplenomegaly, no mass, nontender, left lower quadrant colostomy Extremities: No leg edema Neurologic: Alert, follows commands, oriented   Lab Results: Recent Labs    12/07/20 0459 12/08/20 0508  WBC 5.7 6.0  HGB 13.1 13.1  HCT 39.2 40.2  PLT 185 153    BMET Recent Labs    12/07/20 0459 12/08/20 0508  NA 135 137  K 3.3* 3.3*  CL 100 102  CO2 16* 14*  GLUCOSE 70 74  BUN 11 9  CREATININE 0.54* 0.62  0.50*  CALCIUM 9.3 9.5  Ionized calcium on 12/03/2020: 5.1  Lab Results  Component Value Date   CEA1 2.57 11/18/2020    Studies/Results: CT ABDOMEN PELVIS W CONTRAST  Result Date: 12/06/2020 CLINICAL DATA:  Sepsis, elevated lactic acidosis EXAM: CT ABDOMEN AND PELVIS WITH CONTRAST TECHNIQUE: Multidetector CT imaging of the abdomen and pelvis was performed using the standard protocol following bolus administration of intravenous contrast. CONTRAST:  113mL OMNIPAQUE IOHEXOL 300 MG/ML  SOLN COMPARISON:  CT 11/26/2020 FINDINGS: Lower chest: Some coarsened reticular interstitial changes noted in the lung bases, not significantly changed from prior. No consolidative process or layering effusion. Cardiac size within normal limits. Coronary artery calcifications. No pericardial effusion. Hepatobiliary: Nodular hepatic surface contour. Caudate and left lobe hypertrophy. No focal liver lesion. Multiple calcified gallstones  within the gallbladder. No focal pericholecystic inflammation. No biliary ductal dilatation or intraductal gallstones. Pancreas: No pancreatic ductal dilatation or surrounding inflammatory changes. Spleen: Normal in size. No concerning splenic lesions. Adrenals/Urinary Tract: Marked thickening of the bilateral adrenal glands measuring up to 2.7 cm on the left and 2.6 cm on the right concerning for metastatic infiltration. Stable appearance of the bilateral kidneys with small fluid attenuation cyst in the interpolar left kidney. Bilateral the urothelial thickening, right greater than left, increasing conspicuity from comparison prior. Mild circumferential bladder wall thickening noted as well. No urolithiasis or frank hydronephrosis. Bulky multinodular masslike soft tissue attenuation is seen throughout the right perinephric and pararenal space and extending into the renal sinus, overall appearance unchanged from comparison. Stomach/Bowel: Hiatal hernia. Distal stomach and duodenum with some questionable mural thickening versus underdistention or peristaltic contraction. No other small bowel thickening or dilatation is seen. Postsurgical changes from prior low anterior resection with a left lower quadrant colostomy in place some mild circumferential thickening of the colonic remnant, similar to prior. Presacral soft tissue attenuation and fluid is also quite similar to comparison. No evidence of bowel obstruction. Extensive colonic diverticulosis without evidence of acute diverticulitis. Vascular/Lymphatic: Extensive atherosclerotic calcification throughout the abdominal aorta and branch vessels. Retroperitoneal adenopathy, along the left periaortic region is again seen measuring up to 2.3 cm short axis, unchanged from prior. Reproductive: Prostate appears surgically absent. Other: Small volume ascites, slightly increasing from comparison prior with some increasing edematous changes of the central mesentery.  Presacral soft tissue thickening is stable, described above. No free air. Peristomal herniation of a solitary loop of small bowel at  the site of end colostomy in the left upper quadrant. No resulting obstruction or vascular compromise. Musculoskeletal: No suspicious lytic or blastic lesions. Grade 1 anterolisthesis L4 on 5. Dextrocurvature of the lumbar spine, apex L5. Multilevel degenerative changes are present in the imaged portions of the spine. IMPRESSION: Bulky multinodular soft tissue density in the right Peri and pararenal spaces concerning for metastatic disease and/or lymphoma. Additional periaortic/retroperitoneal adenopathy and conspicuous nodular thickening of the adrenal glands compatible with additional metastatic deposits. Marked urothelial thickening of the right ureter and renal sinus. More mild changes on the left, could reflect ascending urinary tract infection/pyelitis. Correlate with urinalysis. Increasing abdominal ascites and edematous changes in the mesentery, correlate with fluid status. Background of cirrhotic change.  No focal liver lesion. Mild thickening of the distal stomach and duodenal sweep, can be related to cirrhotic features/portal enteropathy though should correlate for clinical features of gastric duodenitis or other complication. Cholelithiasis without evidence of acute cholecystitis. Postsurgical changes from low anterior resection. Fat and small bowel containing parastomal hernia at the site of end colostomy. Stable thickening along the presacral space. Mild mural thickening of the rectal remnant, nonspecific. Diverticulosis without evidence of acute diverticulitis. Electronically Signed   By: Lovena Le M.D.   On: 12/06/2020 23:40   DG Swallowing Func-Speech Pathology  Result Date: 12/07/2020 Formatting of this result is different from the original. Objective Swallowing Evaluation: Type of Study: MBS-Modified Barium Swallow Study  Patient Details Name: Cody Hurley  MRN: [id number] Date of Birth: June 24, 1925 Today's Date: 12/07/2020 Time: SLP Start Time (ACUTE ONLY): 1210 -SLP Stop Time (ACUTE ONLY): 1235 SLP Time Calculation (min) (ACUTE ONLY): 25 min Past Medical History: Past Medical History: Diagnosis Date  Colorectal cancer ([place]) 2016  Prostate cancer [facility])  Past Surgical History: Past Surgical History: Procedure Laterality Date  COLOSTOMY    MELANOMA EXCISION    ear  PROSTATE SURGERY  1991 HPI: Tarus Briski is a 87 y.o. male with history of rectal cancer last treated on 2017-experiencing increasing weakness and poor appetite.  Patient was diagnosed with COVID infection about 2 weeks ago was treated with antiviral and p.o. prednisone.  Subsequent which patient also had come to the ER on June 26 with shortness of breath and at the time patient CT angiogram showed 2 small pulmonary embolism and was placed on apixaban.  The CT scan also showed retroperitoneal lesions concerning for metastatic cancer.  Patient had subsequently followed with [name] patient's oncologist who ordered a CT abdomen pelvis confirms multiple lesions and since patient has been progressively worsening and finding it difficult to ambulate has poor appetite lost at least 20 to 30 pounds in the last month.  Patient admitted for further work-up.  On exam patient appears generally weak otherwise nonfocal.  Has mild swelling of the legs. Chest x-ray with interval worsening of right midlung and suprahilar pulmonary opacities since prior study, concern for possible CAP vs aspiration PNA.  Subjective: Pt seen in radiology for instrumental assessment of swallow function and safety Assessment / Plan / Recommendation CHL IP CLINICAL IMPRESSIONS 12/07/2020 Clinical Impression Pt presents with mild oral, moderate-severe pharyngeal dysphagia, characterized as follows: Orally, pt exhibits poor bolus formation and premature spill. Oral prep is slow and discoordinated. Pharyngeal swallow is characterized by initation of  the swallow reflex at the vallecular sinus most of the time, and at the pyriform sinus intermittently. Post-swallow residue is noted within the vallecular sinus, lateral channels, and pyriform sinus, and increases risk of penetration and aspiration during  meals. Penetration was noted on nectar thick liquids during and after the swallow. Aspiration of thin liquids was seen during the swallow, due to poor epiglottic inversion and insufficient airway protection. Npo cough response was elicited after aspiration events. Pt was noted to pull vallecular residue back into the oral cavity on several occasions. Esophageal sweep revealed it to be very slow to clear. Chin tuck and effortful swallow were not effective to clear vallecular residue. At this time, pt is at significantly high risk for aspiration with continued PO intake based on performance on today's instrumental study. Recommend dys 2 diet and nectar thick liquids, with meds crushed in puree. Also recommend Palliative Care consult given multiple comorbidities. RN and MD informed. SLP will follow for education and diet tolerance assessment.  SLP Visit Diagnosis Dysphagia, pharyngoesophageal phase (R13.14)     Impact on safety and function Severe aspiration risk;Risk for inadequate nutrition/hydration   CHL IP TREATMENT RECOMMENDATION 12/07/2020 Treatment Recommendations Therapy as outlined in treatment plan below   Prognosis 12/07/2020 Prognosis for Safe Diet Advancement Fair     CHL IP DIET RECOMMENDATION 12/07/2020 SLP Diet Recommendations Dysphagia 2 (Fine chop) solids;Nectar thick liquid Liquid Administration via Cup;Straw Medication Administration Crushed with puree Compensations Slow rate;Small sips/bites;Follow solids with liquid Postural Changes Remain semi-upright after after feeds/meals (Comment);Seated upright at 90 degrees   CHL IP OTHER RECOMMENDATIONS 12/07/2020 Recommended Consults Consider esophageal assessment Oral Care Recommendations Oral care QID  Other Recommendations Order thickener from pharmacy   CHL IP FOLLOW UP RECOMMENDATIONS 12/07/2020 Follow up Recommendations Home health SLP;Skilled Nursing facility   [facility] IP FREQUENCY AND DURATION 12/07/2020 Speech Therapy Frequency (ACUTE ONLY) min 1 x/week Treatment Duration 1 week;2 weeks      CHL IP ORAL PHASE 12/07/2020 Oral Phase Impaired   CHL IP PHARYNGEAL PHASE 12/07/2020 Pharyngeal Phase Impaired   CHL IP CERVICAL ESOPHAGEAL PHASE 12/07/2020 Cervical Esophageal Phase Impaired   Celia B. Quentin Ore, [facility], [place] Speech Language Pathologist Office: [phone] Pager: [phone] Shonna Chock 12/07/2020, 2:05 PM               Medications: I have reviewed the patient's current medications.  Assessment/Plan: Stage III rectal cancer,ypT4a,N2 status post a low anterior resection in September 2017 Presenting November 2016 with a mass at 10 cm from the anus, clinical stage II by EUS-T3N0 Neoadjuvant Xeloda and radiation beginning 06/01/2015, completed 20 of 28 planned fractions, discontinued secondary to toxicity Elevated pretreatment CEA CT chest 04/10/2019-no evidence of metastatic disease, changes of interstitial lung disease, changes suggestive of cirrhosis CT chest 11/22/2020-scattered dependent groundglass airspace opacity and irregular interstitial opacity-nonspecific infectious or inflammatory, new bilateral adrenal nodules, extensive ill-defined soft tissue surrounding the right kidney suspicious for malignancy CT abdomen/pelvis 11/26/2020-bilateral adrenal masses, multinodular masslike soft tissue density throughout the right perinephric space and renal sinus, new left periaortic and retroperitoneal adenopathy, cirrhosis   Prostate cancer 1990 treated with a prostatectomy History of a TIA Carpal tunnel syndrome Palpable liver edge-ultrasound abdomen 04/10/2019-fatty infiltration of the liver, normal portal vein flow Left lung subsegmental pulmonary emboli 11/22/2020-apixaban Anorexia/weight  loss COVID-19 infection June 2022 Admission 12/01/2020 with failure to thrive Elevated calcium-normal ionized calcium 12/03/2020 Retroperitoneal biopsy from 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 Lobdell appears partially improved with intravenous hydration and initiation of prednisone therapy.  The pathology report from the retroperitoneal mass biopsy is consistent with large B-cell lymphoma.  Biopsy results were discussed with the patient as well as with his daughter who was at the  bedside and his wife who was on the telephone.  We discussed proceeding with chemotherapy that may put him into remission or possibly even cure him.  The patient and his family expressed concern about proceeding with chemotherapy given the difficulties he had with in the past.  We talked about the differences in his prior regimen versus the current regimen and that chemotherapy would be dose adjusted considering his age and performance status.  He would like time to think about this.  [name] will talk with him and his family further about the chemotherapy regimen.  Anticipate giving R-CVP and possibly adding etoposide to the regimen.  Further discussion tomorrow morning.  He will need a PICC line if he decides to proceed with chemotherapy.  I have held off on ordering this per patient/family request today.  This will be ordered once the patient makes a decision about proceeding with chemotherapy.  I have added on a differential to today's CBC as well as an LDH and uric acid level.  We will start him on allopurinol 300 mg daily.  Recommend continued hydration to reduce risk of tumor lysis syndrome.  Recommendations: 1.  Biopsy results discussed with the patient and his family.  Recommend proceeding with chemotherapy with R-CVP and possibly adding etoposide.  The patient and family wish to think about this further and discuss more with [name] in the a.m. 2.  Continue prednisone. 3.  Add Magic  mouthwash for mouth discomfort. 4.  Add on differential, LDH, and uric acid to this morning's labs 5.  Begin allopurinol 300 mg daily.   LOS: 7 days   Mikey Bussing, NP   12/08/2020, 8:11 AM Mr. Tomei was interviewed and examined.  His daughter was at the bedside when I saw him this morning.  The pathology from the retroperitoneal mass biopsy confirms large B-cell lymphoma.  The lymphoma CD20 positive and cyclin negative.  The pathology results have been relayed to the patient and his family.  I recommend treatment with systemic therapy.  There is a high chance of clinical remission with systemic therapy.  I will discuss mini R-CHOP, R-CVP, and R- CEOP with Mr. Searcy and his family on 12/09/2020.  I anticipate beginning chemotherapy on 12/09/2020 if he is in agreement.  I was present for greater than 50% of today's visit.  I performed medical decision making.

## 2020-12-08 NOTE — Progress Notes (Signed)
ANTICOAGULATION CONSULT NOTE - Initial Consult  Pharmacy Consult for apixaban Indication: pulmonary embolus  Allergies  Allergen Reactions   Penicillin G Rash    Patient Measurements: Height: 5\' 7"  (170.2 cm) Weight: 61.4 kg (135 lb 6.4 oz) IBW/kg (Calculated) : 66.1   Vital Signs: Temp: 97.4 F (36.3 C) (07/12 2107) Temp Source: Oral (07/12 2107) BP: 119/79 (07/12 2107) Pulse Rate: 77 (07/12 2107)  Labs: Recent Labs    12/06/20 0558 12/07/20 0459 12/08/20 0508  HGB 13.2 13.1 13.1  HCT 39.9 39.2 40.2  PLT 184 185 153  HEPARINUNFRC 0.42 0.37 0.33  CREATININE 0.66 0.54* 0.62  0.50*    Estimated Creatinine Clearance: 50.1 mL/min (A) (by C-G formula based on SCr of 0.5 mg/dL (L)).   Medical History: Past Medical History:  Diagnosis Date   Colorectal cancer ([place]) 2016   Prostate cancer [facility])      Assessment: 56 yoM with PMH rectal cancer, recently started on Eliquis for PE 11/22/20, admitted directly from [facility] for inpatient workup for possible metastatic malignancy. Eliquis held for retroperitoneal/renal Bx, with Pharmacy to dose heparin in its place.  Pharmacy now consulted to dose eliquis   Plan:  Stop heparin drip and start apixaban 5mg  po bid    Dolly Rias RPh 12/08/2020, 11:22 PM

## 2020-12-08 NOTE — Consult Note (Signed)
Consultation Note Date: 12/08/2020   Patient Name: Cody Hurley  DOB: 04/25/1926  MRN: [id number]  Age / Sex: 87 y.o., male  PCP: [name], MD Referring Physician: Eugenie Filler, MD  Reason for Consultation: Establishing goals of care and Psychosocial/spiritual support  HPI/Patient Profile: 87 y.o. male  admitted on 12/01/2020 with  Cody Hurley is a 87 y.o. male with hx of prostate ca in 1990s, rectal cancer in 2019 s/p low anterior resection with prior chemoradiation, and recently left lung subsegmental pulmonary emboli. He reports having unintentional weight loss and malaise and weakness/failure to thrive. He was recovering from covid roughly 2 weeks ago, complicated by PE. The CT angio for evaluation of PE also showed retroperitoneal lesions concerning for new metastatic cancer. He was admitted on 7/5, underwent biopsy with path still pending. Patient reports having dry mouth, no cough. ID asked to evaluate for reason of elevated LA. On labs has persistent LA of > 11. Procalcitonin is negative   CT showed:  Bulky multinodular soft tissue density in the right Peri and pararenal spaces concerning for metastatic disease and/or lymphoma. Additional periaortic/retroperitoneal adenopathy and conspicuous nodular thickening of the adrenal glands compatible with additional metastatic deposits.  Patient and family face treatment option decisions, advanced directive decisions and anticipatory care needs.   Clinical Assessment and Goals of Care:  This NP Wadie Lessen reviewed medical records, received report from team, assessed the patient and then meet at the patient's bedside along with his daughter/Patricia Kinzie  to discuss diagnosis, prognosis, GOC, EOL wishes disposition and options.   Concept of Palliative Care was introduced as specialized medical care for people and their families living with  serious illness.  If focuses on providing relief from the symptoms and stress of a serious illness.  The goal is to improve quality of life for both the patient and the family.   Values and goals of care important to patient and family were attempted to be elicited.  Created space and opportunity for patient  and family to explore thoughts and feelings regarding current medical situation.  Both patient and his daughter verbalize an understanding of the seriousness of his current medical situation however they remain hopeful and open to all offered and available medical interventions to prolong life.  Currently the plan is to move forward with further chemotherapy, out patient , as discussed with Dr [name].    There were many questions regarding next steps and transition of care.   Patient is still questioning possibility of SNF for rehabilitation, daughter is anticipating a discharge home augmented with out-of-pocket caregivers and patient's wife was not present for today's discussion.  Patient lives at home with his wife  In an attempt to alleviate any miscommunication a second family meeting is scheduled for tomorrow morning at 10:00 with the patient, his wife and daughter at bedside for continued conversation regarding goals of care.    A  discussion was had today regarding advanced directives.  Concepts specific to code status, artifical feeding and hydration, continued  IV antibiotics and rehospitalization was had.    Family to bring in documents for scanning in the morning.  MOST form introduced   The difference between a aggressive medical intervention path  and a palliative comfort care path for this patient at this time was had.   Education offered on hospice eligibility and benefit in the home     Natural trajectory and expectations at EOL were discussed.  Questions and concerns addressed.  Patient  encouraged to call with questions or concerns.     PMT will continue to support  holistically.              HCPOA/wife -to bring in paperwork in the morning    SUMMARY OF RECOMMENDATIONS    Code Status/Advance Care Planning: Full code Encouraged patient/family to consider DNR/DNI status understanding evidenced based poor outcomes in similar hospitalized patient, as the cause of arrest is likely associated with advanced chronic illness rather than an easily reversible acute cardio-pulmonary event.    Symptom Management:  Per attending  Palliative Prophylaxis:  Bowel Regimen, Frequent Pain Assessment, and Oral Care  Additional Recommendations (Limitations, Scope, Preferences): Full Scope Treatment  Psycho-social/Spiritual:  Desire for further Chaplaincy support:no Additional Recommendations: Education on Hospice  Prognosis:  Unable to determine-will depend on decisions for life-prolonging measures  Discharge Planning: To Be Determined      Primary Diagnoses: Present on Admission:  Retroperitoneal mass  Pulmonary embolism (HCC)  FTT (failure to thrive) in adult  Hypercalcemia  History of rectal cancer  COVID-19 virus infection: Recent COVID-19 infection status posttreatment   I have reviewed the medical record, interviewed the patient and family, and examined the patient. The following aspects are pertinent.  Past Medical History:  Diagnosis Date   Colorectal cancer ([place]) 2016   Prostate cancer [facility])    Social History   Socioeconomic History   Marital status: Married    Spouse name: Not on file   Number of children: Not on file   Years of education: Not on file   Highest education level: Not on file  Occupational History   Not on file  Tobacco Use   Smoking status: Never   Smokeless tobacco: Never  Vaping Use   Vaping Use: Never used  Substance and Sexual Activity   Alcohol use: Never   Drug use: Never   Sexual activity: Not on file  Other Topics Concern   Not on file  Social History Narrative   Not on file   Social  Determinants of Health   Financial Resource Strain: Not on file  Food Insecurity: Not on file  Transportation Needs: Not on file  Physical Activity: Not on file  Stress: Not on file  Social Connections: Not on file   History reviewed. No pertinent family history. Scheduled Meds:  allopurinol  300 mg Oral Daily   feeding supplement  237 mL Oral BID BM   food thickener  1 packet Oral TID WC, HS, 0200   multivitamin with minerals  1 tablet Oral Daily   nystatin  5 mL Oral QID   phosphorus  250 mg Oral BID   predniSONE  20 mg Oral Q breakfast   sodium bicarbonate  1,300 mg Oral TID   Continuous Infusions:  heparin 800 Units/hr (12/08/20 0622)   metronidazole 500 mg (12/08/20 0955)   PRN Meds:.acetaminophen **OR** acetaminophen, antiseptic oral rinse, lip balm, magic mouthwash w/lidocaine Medications Prior to Admission:  Prior to Admission medications   Medication Sig Start Date End Date  Taking? Authorizing Provider  acetaminophen (TYLENOL) 500 MG tablet Take 500-1,000 mg by mouth every 6 (six) hours as needed for mild pain (or headaches).   Yes [provider]  APIXABAN Arne Cleveland) VTE STARTER PACK (10MG  AND 5MG ) Take as directed on package: start with two-5mg  tablets twice daily for 7 days. On day 8, switch to one-5mg  tablet twice daily. Patient taking differently: Take 5-10 mg by mouth See admin instructions. Take 10 mg by mouth two times a day for 7 days, then decrease to 5 mg two times a day on "day 8" 11/22/20  Yes Davonna Belling, MD  Cholecalciferol (VITAMIN D-3) 25 MCG (1000 UT) CAPS Take 2,000 Units by mouth daily with breakfast.   Yes [provider]  Multiple Vitamin (MULTIVITAMIN) tablet Take 1 tablet by mouth daily.   Yes [provider]  oxybutynin (DITROPAN) 5 MG tablet Take 5 mg by mouth daily. 09/18/20  Yes [provider]  vitamin B-12 (CYANOCOBALAMIN) 100 MCG tablet Take 100 mcg by mouth daily. 09/18/20  Yes [provider]    Allergies  Allergen Reactions   Penicillin G Rash   Review of Systems  Constitutional:  Positive for fatigue.   Physical Exam Constitutional:      Appearance: He is cachectic. He is ill-appearing.  HENT:     Right Ear: Decreased hearing noted.  Cardiovascular:     Rate and Rhythm: Normal rate.  Pulmonary:     Effort: Pulmonary effort is normal.  Musculoskeletal:     Comments: Generalized weakness and muscle atrophy  Skin:    General: Skin is warm and dry.  Neurological:     Mental Status: He is alert and oriented to person, place, and time.    Vital Signs: BP 135/64 (BP Location: Left Arm)   Pulse 86   Temp 97.8 F (36.6 C) (Oral)   Resp 18   Ht 5\' 7"  (1.702 m)   Wt 61.4 kg   SpO2 96%   BMI 21.21 kg/m  Pain Scale: 0-10   Pain Score: 0-No pain   SpO2: SpO2: 96 % O2 Device:SpO2: 96 % O2 Flow Rate: .O2 Flow Rate (L/min): 2 L/min  IO: Intake/output summary:  Intake/Output Summary (Last 24 hours) at 12/08/2020 1402 Last data filed at 12/08/2020 1100 Gross per 24 hour  Intake 3680.22 ml  Output 1850 ml  Net 1830.22 ml    LBM: Last BM Date: 12/06/20 Baseline Weight: Weight: 61.4 kg Most recent weight: Weight: 61.4 kg     Palliative Assessment/Data:  50 %    Discussed with Dr Grandville Silos   Time In: 0900 Time Out: 1015 Time Total: 75 minutes Greater than 50%  of this time was spent counseling and coordinating care related to the above assessment and plan.  Signed by: Wadie Lessen, NP   Please contact Palliative Medicine Team phone at [phone] for questions and concerns.  For individual provider: See Shea Evans

## 2020-12-08 NOTE — Progress Notes (Signed)
Physical Therapy Treatment Patient Details Name: Cody Hurley MRN: [id number] DOB: 05-16-1926 Today's Date: 12/08/2020    History of Present Illness 87 yo male admitted with retroperitoneal mass, weakness, FTT. Hx of stage III rectal ca, LAR 2017, prostate ca, PE, anorexia, recent COVID    PT Comments    Patient progressing well with therapy and remains motivated to mobilize and maintain functional strength/independence with mobility. He required min assist for bed mobility today and min assist/guard for gait with repeated cues needed for posture and safe walker management. Pt completed LE strengthening in sitting and demonstrated excellent power up for sit<>stand from EOB and recliner with use of single UE to initiate. Acute PT will continue to progress pt as able to improved functional mobility for safe discharge home with assist from family.    Follow Up Recommendations  Home health PT;Supervision/Assistance - 24 hour;SNF     Equipment Recommendations  None recommended by PT    Recommendations for Other Services       Precautions / Restrictions Precautions Precautions: Fall Precaution Comments: colostomy Restrictions Weight Bearing Restrictions: No    Mobility  Bed Mobility Overal bed mobility: Needs Assistance Bed Mobility: Supine to Sit     Supine to sit: Min assist;HOB elevated     General bed mobility comments: cues for sequencing with use of bed rail. assist to bring LE's off EOB and raise trunk fully. Bed pad use for scooting to EOB.    Transfers Overall transfer level: Needs assistance Equipment used: Rolling walker (2 wheeled) Transfers: Sit to/from Stand Sit to Stand: From elevated surface;Min assist         General transfer comment: min assist to power up. cues for hand placement.  Ambulation/Gait Ambulation/Gait assistance: Min assist Gait Distance (Feet): 160 Feet Assistive device: Rolling walker (2 wheeled) Gait Pattern/deviations: Step-through  pattern;Decreased stride length Gait velocity: decr   General Gait Details: Cues for posture and assist to maintain safe RW proximity.  Pt tolerated distance well with no complaints stating "feels good"   Stairs             Wheelchair Mobility    Modified Rankin (Stroke Patients Only)       Balance Overall balance assessment: Needs assistance Sitting-balance support: Bilateral upper extremity supported Sitting balance-Leahy Scale: Fair     Standing balance support: During functional activity;Bilateral upper extremity supported Standing balance-Leahy Scale: Poor Standing balance comment: relinat on external support from RW                            Cognition Arousal/Alertness: Awake/alert Behavior During Therapy: WFL for tasks assessed/performed Overall Cognitive Status: Within Functional Limits for tasks assessed                                 General Comments: very HOH with hearing aids in place.      Exercises General Exercises - Lower Extremity Long Arc Quad: AROM;Both;10 reps;Seated Hip Flexion/Marching: AROM;Both;10 reps;Seated    General Comments        Pertinent Vitals/Pain Pain Assessment: No/denies pain    Home Living                      Prior Function            PT Goals (current goals can now be found in the care plan section) Acute Rehab PT  Goals PT Goal Formulation: With patient/family Time For Goal Achievement: 12/16/20 Potential to Achieve Goals: Fair Progress towards PT goals: Progressing toward goals    Frequency    Min 3X/week      PT Plan Current plan remains appropriate    Co-evaluation              AM-PAC PT "6 Clicks" Mobility   Outcome Measure  Help needed turning from your back to your side while in a flat bed without using bedrails?: A Little Help needed moving from lying on your back to sitting on the side of a flat bed without using bedrails?: A Little Help needed  moving to and from a bed to a chair (including a wheelchair)?: A Little Help needed standing up from a chair using your arms (e.g., wheelchair or bedside chair)?: A Little Help needed to walk in hospital room?: A Little Help needed climbing 3-5 steps with a railing? : A Lot 6 Click Score: 17    End of Session Equipment Utilized During Treatment: Gait belt Activity Tolerance: Patient tolerated treatment well Patient left: in chair;with call bell/phone within reach;with chair alarm set;with family/visitor present Nurse Communication: Mobility status PT Visit Diagnosis: Muscle weakness (generalized) (M62.81);Difficulty in walking, not elsewhere classified (R26.2)     Time: 8592-9244 PT Time Calculation (min) (ACUTE ONLY): 30 min  Charges:  $Gait Training: 8-22 mins $Therapeutic Exercise: 8-22 mins                     Cody Hurley, DPT Acute Rehabilitation Services Office [phone] Pager [phone]    Jacques Navy 12/08/2020, 6:28 PM

## 2020-12-09 ENCOUNTER — Inpatient Hospital Stay (HOSPITAL_COMMUNITY): Payer: Medicare Other

## 2020-12-09 ENCOUNTER — Inpatient Hospital Stay: Payer: Self-pay

## 2020-12-09 DIAGNOSIS — C8590 Non-Hodgkin lymphoma, unspecified, unspecified site: Secondary | ICD-10-CM

## 2020-12-09 DIAGNOSIS — R1900 Intra-abdominal and pelvic swelling, mass and lump, unspecified site: Secondary | ICD-10-CM | POA: Diagnosis not present

## 2020-12-09 DIAGNOSIS — R531 Weakness: Secondary | ICD-10-CM | POA: Diagnosis not present

## 2020-12-09 DIAGNOSIS — R627 Adult failure to thrive: Secondary | ICD-10-CM | POA: Diagnosis not present

## 2020-12-09 DIAGNOSIS — Z66 Do not resuscitate: Secondary | ICD-10-CM

## 2020-12-09 LAB — RENAL FUNCTION PANEL
Albumin: 2.5 g/dL — ABNORMAL LOW (ref 3.5–5.0)
Anion gap: >20 — ABNORMAL HIGH (ref 5–15)
BUN: 11 mg/dL (ref 8–23)
CO2: 13 mmol/L — ABNORMAL LOW (ref 22–32)
Calcium: 10.1 mg/dL (ref 8.9–10.3)
Chloride: 103 mmol/L (ref 98–111)
Creatinine, Ser: 0.56 mg/dL — ABNORMAL LOW (ref 0.61–1.24)
GFR, Estimated: >60 mL/min (ref >60)
Glucose, Bld: 61 mg/dL — ABNORMAL LOW (ref 70–99)
Phosphorus: 2.6 mg/dL (ref 2.5–4.6)
Potassium: 4.2 mmol/L (ref 3.5–5.1)
Sodium: 137 mmol/L (ref 135–145)

## 2020-12-09 LAB — GLUCOSE, CAPILLARY
Glucose-Capillary: 61 mg/dL — ABNORMAL LOW (ref 70–99)
Glucose-Capillary: 89 mg/dL (ref 70–99)
Glucose-Capillary: 93 mg/dL (ref 70–99)
Glucose-Capillary: 139 mg/dL — ABNORMAL HIGH (ref 70–99)
Glucose-Capillary: 85 mg/dL (ref 70–99)

## 2020-12-09 LAB — CBC WITH DIFFERENTIAL/PLATELET
Abs Immature Granulocytes: 0.08 10*3/uL — ABNORMAL HIGH (ref 0.00–0.07)
Basophils Absolute: 0 10*3/uL (ref 0.0–0.1)
Basophils Relative: 1 %
Eosinophils Absolute: 0.1 10*3/uL (ref 0.0–0.5)
Eosinophils Relative: 2 %
HCT: 41.2 % (ref 39.0–52.0)
Hemoglobin: 13.6 g/dL (ref 13.0–17.0)
Immature Granulocytes: 1 %
Lymphocytes Relative: 13 %
Lymphs Abs: 0.7 10*3/uL (ref 0.7–4.0)
MCH: 34.7 pg — ABNORMAL HIGH (ref 26.0–34.0)
MCHC: 33 g/dL (ref 30.0–36.0)
MCV: 105.1 fL — ABNORMAL HIGH (ref 80.0–100.0)
Monocytes Absolute: 0.8 10*3/uL (ref 0.1–1.0)
Monocytes Relative: 15 %
Neutro Abs: 3.8 10*3/uL (ref 1.7–7.7)
Neutrophils Relative %: 68 %
Platelets: 165 10*3/uL (ref 150–400)
RBC: 3.92 MIL/uL — ABNORMAL LOW (ref 4.22–5.81)
RDW: 18 % — ABNORMAL HIGH (ref 11.5–15.5)
WBC: 5.5 10*3/uL (ref 4.0–10.5)
nRBC: 0 % (ref 0.0–0.2)

## 2020-12-09 LAB — MAGNESIUM: Magnesium: 2 mg/dL (ref 1.7–2.4)

## 2020-12-09 LAB — HEPATITIS B SURFACE ANTIGEN: Hepatitis B Surface Ag: NONREACTIVE

## 2020-12-09 LAB — HEPATITIS B CORE ANTIBODY, TOTAL: Hep B Core Total Ab: NONREACTIVE

## 2020-12-09 MED ORDER — LIDOCAINE HCL 1 % IJ SOLN
INTRAMUSCULAR | Status: AC
  Filled 2020-12-09: qty 20

## 2020-12-09 MED ORDER — DEXTROSE 50 % IV SOLN
12.5000 g | INTRAVENOUS | Status: AC
  Administered 2020-12-09: 12.5 g via INTRAVENOUS
  Filled 2020-12-09: qty 50

## 2020-12-09 NOTE — Progress Notes (Signed)
PROGRESS NOTE    Cody Hurley  JKD:[id number] DOB: 01-10-1926 DOA: 12/01/2020 PCP: [name], MD   Brief Narrative: HPI per Dr. Doylene Hurley is a 87 y.o. male with history of rectal cancer last treated on 2017-experiencing increasing weakness and poor appetite.  Patient was diagnosed with COVID infection about 2 weeks ago was treated with antiviral and p.o. prednisone.  Subsequent which patient also had come to the ER on June 26 with shortness of breath and at the time patient CT angiogram showed 2 small pulmonary embolism and was placed on apixaban.  The CT scan also showed retroperitoneal lesions concerning for metastatic cancer.  Patient had subsequently followed with Dr. Benay Hurley patient's oncologist who ordered a CT abdomen pelvis confirms multiple lesions and since patient has been progressively worsening and finding it difficult to ambulate has poor appetite lost at least 20 to 30 pounds in the last month.  Patient admitted for further work-up.  On exam patient appears generally weak otherwise nonfocal.  Has mild swelling of the legs.  Assessment & Plan:   Principal Problem:   Retroperitoneal mass Active Problems:   Pulmonary embolism (HCC)   FTT (failure to thrive) in adult   Hypercalcemia   History of rectal cancer   History of prostate cancer   COVID-19 virus infection: Recent COVID-19 infection status posttreatment   Acidosis   Lactic acidosis   Non-Hodgkin lymphoma (HCC)    #1 retroperitoneal renal mass concerning for metastatic cancer-status postbiopsy consistent with aggressive B-cell lymphoma.  Patient and his wife wanting chemotherapy to be started.  Plan per oncology.  #2 recent PE on Eliquis.  #3 history of rectal cancer and prostate cancer followed by Dr. Benay Hurley.  #4 hypercalcemia likely secondary to malignancy however will DC multivitamins.  #5 recent COVID 19 he received treatment with Decadron and remdesivir.  Currently he is on room  air.  #6 lactic acidosis likely secondary to malignancy.  ID was consulted.  Antibiotics stopped.  MRSA PCR negative.  CT abdomen and pelvis no acute abnormalities.  Chest x-ray showed interval worsening right midlung and suprahilar pulmonary opacities since the prior study. Anion gap elevated above 20.  #7 hypoglycemia-CBG (last 3)  Recent Labs    12/09/20 0754 12/09/20 0913 12/09/20 1239  GLUCAP 61* 89 93         Nutrition Problem: Inadequate oral intake Etiology: acute illness     Signs/Symptoms: per patient/family report    Interventions: Ensure Enlive (each supplement provides 350kcal and 20 grams of protein), MVI, Magic cup  Estimated body mass index is 21.21 kg/m as calculated from the following:   Height as of this encounter: 5\' 7"  (1.702 m).   Weight as of this encounter: 61.4 kg.  DVT prophylaxis: Eliquis  code Status: DNR Family Communication: Wife at bedside disposition Plan:  Status is: Inpatient  Remains inpatient appropriate because:IV treatments appropriate due to intensity of illness or inability to take PO  Dispo: The patient is from: Home              Anticipated d/c is to: Home              Patient currently is not medically stable to d/c.   Difficult to place patient No       Consultants:  Oncology palliative care interventional radiology infectious disease  Procedures: CT-guided needle biopsy of the right retroperitoneal mass 12/03/2020  Antimicrobials: None  Subjective: Resting in bed wife at bedside Reports he is very  weak seen by palliative care CODE STATUS changed to DNR  Objective: Vitals:   12/08/20 1339 12/08/20 2107 12/09/20 0510 12/09/20 1408  BP: 135/64 119/79 114/77 119/73  Pulse: 86 77 93 93  Resp:  16 16 15   Temp: 97.8 F (36.6 C) (!) 97.4 F (36.3 C) 97.8 F (36.6 C) (!) 97 F (36.1 C)  TempSrc: Oral Oral Oral   SpO2: 96% 94% 94% 93%  Weight:      Height:        Intake/Output Summary (Last 24 hours) at  12/09/2020 1531 Last data filed at 12/09/2020 1000 Gross per 24 hour  Intake 721.53 ml  Output 1700 ml  Net -978.47 ml   Filed Weights   12/04/20 0406  Weight: 61.4 kg    Examination:  General exam: Appears calm and comfortable  Respiratory system: Scattered rhonchi o auscultation. Respiratory effort normal. Cardiovascular system: S1 & S2 heard, RRR. No JVD, murmurs, rubs, gallops or clicks. No pedal edema. Gastrointestinal system: Abdomen is nondistended, soft and nontender. No organomegaly or masses felt. Normal bowel sounds heard. Central nervous system: Alert and oriented. No focal neurological deficits. Extremities: 1+ bilateral pitting edema  skin: No rashes, lesions or ulcers Psychiatry: Judgement and insight appear normal. Mood & affect appropriate.     Data Reviewed: I have personally reviewed following labs and imaging studies  CBC: Recent Labs  Lab 12/05/20 0546 12/06/20 0558 12/07/20 0459 12/08/20 0501 12/08/20 0508 12/09/20 0516  WBC 4.4 4.9 5.7  --  6.0 5.5  NEUTROABS  --   --   --  4.0  --  3.8  HGB 13.3 13.2 13.1  --  13.1 13.6  HCT 39.4 39.9 39.2  --  40.2 41.2  MCV 101.3* 103.1* 102.1*  --  104.4* 105.1*  PLT 179 184 185  --  153 585   Basic Metabolic Panel: Recent Labs  Lab 12/04/20 0201 12/05/20 0546 12/06/20 0558 12/07/20 0459 12/08/20 0508 12/09/20 0516  NA 135 136 136 135 137 137  K 3.3* 3.6 3.3* 3.3* 3.3* 4.2  CL 100 99 100 100 102 103  CO2 19* 19* 17* 16* 14* 13*  GLUCOSE 80 64* 96 70 74 61*  BUN 12 15 13 11 9 11   CREATININE 0.62 0.60* 0.66 0.54* 0.62  0.50* 0.56*  CALCIUM 9.6 9.6 9.8 9.3 9.5 10.1  MG 2.0  --  2.0 1.9 1.9 2.0  PHOS 2.2*  --  2.4* 2.4* 2.4* 2.6   GFR: Estimated Creatinine Clearance: 50.1 mL/min (A) (by C-G formula based on SCr of 0.56 mg/dL (L)). Liver Function Tests: Recent Labs  Lab 12/03/20 0657 12/04/20 0201 12/06/20 0558 12/07/20 0459 12/08/20 0508 12/09/20 0516  AST 56*  --   --  65*  --   --    ALT 27  --   --  33  --   --   ALKPHOS 68  --   --  69  --   --   BILITOT 1.1  --   --  0.7  --   --   PROT 6.0*  --   --  5.6*  --   --   ALBUMIN 2.7* 2.5* 2.5* 2.4* 2.6* 2.5*   No results for input(s): LIPASE, AMYLASE in the last 168 hours. No results for input(s): AMMONIA in the last 168 hours. Coagulation Profile: No results for input(s): INR, PROTIME in the last 168 hours. Cardiac Enzymes: No results for input(s): CKTOTAL, CKMB, CKMBINDEX, TROPONINI in the last  168 hours. BNP (last 3 results) No results for input(s): PROBNP in the last 8760 hours. HbA1C: No results for input(s): HGBA1C in the last 72 hours. CBG: Recent Labs  Lab 12/08/20 1731 12/08/20 2133 12/09/20 0754 12/09/20 0913 12/09/20 1239  GLUCAP 126* 91 61* 89 93   Lipid Profile: No results for input(s): CHOL, HDL, LDLCALC, TRIG, CHOLHDL, LDLDIRECT in the last 72 hours. Thyroid Function Tests: No results for input(s): TSH, T4TOTAL, FREET4, T3FREE, THYROIDAB in the last 72 hours. Anemia Panel: No results for input(s): VITAMINB12, FOLATE, FERRITIN, TIBC, IRON, RETICCTPCT in the last 72 hours. Sepsis Labs: Recent Labs  Lab 12/06/20 1029 12/06/20 1342 12/06/20 1615 12/07/20 0459 12/08/20 0508  PROCALCITON  --   --   --  <0.10 <0.10  LATICACIDVEN >11.0* >11.0* >11.0* >11.0*  --     Recent Results (from the past 240 hour(s))  Culture, Urine     Status: None   Collection Time: 12/03/20  7:00 AM   Specimen: Urine, Catheterized  Result Value Ref Range Status   Specimen Description   Final    URINE, CATHETERIZED Performed at [facility], [place] [address], [place], [place] [address]    Special Requests   Final    NONE Performed at [facility], [place] [address] [place], [place] [address]    Culture   Final    NO GROWTH Performed at [facility], [place] [address], [place], [place] [address]    Report Status 12/05/2020 FINAL  Final  Culture, blood (Routine X 2)  w Reflex to ID Panel     Status: None (Preliminary result)   Collection Time: 12/06/20  1:42 PM   Specimen: BLOOD  Result Value Ref Range Status   Specimen Description   Final    BLOOD RIGHT ANTECUBITAL Performed at [place] [address], [place], [place] [address]    Special Requests   Final    BOTTLES DRAWN AEROBIC ONLY Blood Culture adequate volume Performed at [place] [address] [place], [place] [address]    Culture   Final    NO GROWTH 3 DAYS Performed at [facility], [place] [address] [place], [place] [address]    Report Status PENDING  Incomplete  Culture, blood (Routine X 2) w Reflex to ID Panel     Status: None (Preliminary result)   Collection Time: 12/06/20  4:11 PM   Specimen: BLOOD  Result Value Ref Range Status   Specimen Description   Final    BLOOD RIGHT ANTECUBITAL Performed at [place] [address] [place], [place] [address]    Special Requests   Final    BOTTLES DRAWN AEROBIC AND ANAEROBIC Blood Culture results may not be optimal due to an inadequate volume of blood received in culture bottles Performed at [place] [address] [place], [place] [address]    Culture   Final    NO GROWTH 3 DAYS Performed at [facility], [place] [address], [place], [place] [address]    Report Status PENDING  Incomplete  Culture, Urine     Status: None   Collection Time: 12/07/20 10:17 AM   Specimen: Urine, Clean Catch  Result Value Ref Range Status   Specimen Description   Final    URINE, CLEAN CATCH Performed at [facility], [place] [address], [place],  [address]    Special Requests   Final  NONE Performed at [facility], [place] [address], [place], [place] [address]    Culture   Final    NO GROWTH Performed at [facility], [place] [address] [place], [place] [address]    Report Status 12/08/2020 FINAL   Final  MRSA Next Gen by PCR, Nasal     Status: None   Collection Time: 12/07/20 11:00 AM   Specimen: Nasal Mucosa; Nasal Swab  Result Value Ref Range Status   MRSA by PCR Next Gen NOT DETECTED NOT DETECTED Final    Comment: (NOTE) The GeneXpert MRSA Assay (FDA approved for NASAL specimens only), is one component of a comprehensive MRSA colonization surveillance program. It is not intended to diagnose MRSA infection nor to guide or monitor treatment for MRSA infections. Test performance is not FDA approved in patients less than 70 years old. Performed at [facility], [place] [address], [place], [place] [address]          Radiology Studies: [place] EKG SITE RITE  Result Date: 12/09/2020 If Site Rite image not attached, placement could not be confirmed due to current cardiac rhythm.       Scheduled Meds:  allopurinol  300 mg Oral Daily   apixaban  5 mg Oral BID   feeding supplement  237 mL Oral BID BM   food thickener  1 packet Oral TID WC, HS, 0200   lidocaine       sodium bicarbonate  1,300 mg Oral TID   Continuous Infusions:   LOS: 8 days    Time spent: 40 min    Georgette Shell, MD  12/09/2020, 3:31 PM

## 2020-12-09 NOTE — Progress Notes (Addendum)
Hypoglycemic Event  CBG: 61   Treatment: D50 25 mL (12.5 gm)  Symptoms: none  Follow-up CBG: YYQM:2500  CBG Result: 89  Possible Reasons for Event: Inadequate meal intake  Comments/MD notified: MD made aware    Cody Hurley

## 2020-12-09 NOTE — Progress Notes (Signed)
MEDICATION-RELATED CONSULT NOTE   IR Procedure Consult - Anticoagulant/Antiplatelet PTA/Inpatient Med List Review by Pharmacist    Procedure: PICC placement    Completed: 7/13  Post-Procedural bleeding risk per IR MD assessment:  Low  Antithrombotic medications on inpatient or PTA profile prior to procedure:   Apixaban   Recommended restart time per IR Post-Procedure Guidelines:  N/A   Other considerations:   Apixaban was not held prior to this procedure.   Plan:     Continue apixaban 5 mg PO BID.  Gretta Arab PharmD, BCPS Clinical Pharmacist [facility] main pharmacy [phone] 12/09/2020 4:34 PM

## 2020-12-09 NOTE — Progress Notes (Signed)
IP PROGRESS NOTE  Subjective:   [name] continues to have a dry mouth.  He was able to ambulate in the hall yesterday.  His wife is at the bedside.  Objective: Vital signs in last 24 hours: Blood pressure 114/77, pulse 93, temperature 97.8 F (36.6 C), temperature source Oral, resp. rate 16, height 5' 7" (1.702 m), weight 135 lb 6.4 oz (61.4 kg), SpO2 94 %.  Intake/Output from previous day: 07/12 0701 - 07/13 0700 In: 966.5 [P.O.:720; I.V.:89.6; IV Piggyback:157] Out: 2300 [Urine:2000; Stool:300]  Physical Exam:  HEENT: Dryness of the tongue and buccal mucosa, no thrush Lungs: Inspiratory rhonchi at the lower posterior chest bilaterally, no respiratory distress Cardiac: Irregular Abdomen: No hepatosplenomegaly, no mass, nontender, left lower quadrant colostomy Extremities: Trace pitting edema to lower leg bilaterally Neurologic: Alert, follows commands, oriented   Lab Results: Recent Labs    12/08/20 0508 12/09/20 0516  WBC 6.0 5.5  HGB 13.1 13.6  HCT 40.2 41.2  PLT 153 165    BMET Recent Labs    12/08/20 0508 12/09/20 0516  NA 137 137  K 3.3* 4.2  CL 102 103  CO2 14* 13*  GLUCOSE 74 61*  BUN 9 11  CREATININE 0.62  0.50* 0.56*  CALCIUM 9.5 10.1  Ionized calcium on 12/03/2020: 5.1  Lab Results  Component Value Date   CEA1 2.57 11/18/2020    Studies/Results: DG Swallowing Func-Speech Pathology  Result Date: 12/07/2020 Formatting of this result is different from the original. Objective Swallowing Evaluation: Type of Study: MBS-Modified Barium Swallow Study  Patient Details Name: Cody Hurley MRN: [id number] Date of Birth: Jul 28, 1925 Today's Date: 12/07/2020 Time: SLP Start Time (ACUTE ONLY): 1210 -SLP Stop Time (ACUTE ONLY): 9381 SLP Time Calculation (min) (ACUTE ONLY): 25 min Past Medical History: Past Medical History: Diagnosis Date  Colorectal cancer ([place]) 2016  Prostate cancer [facility])  Past Surgical History: Past Surgical History: Procedure Laterality Date   COLOSTOMY    MELANOMA EXCISION    ear  PROSTATE SURGERY  1991 HPI: Cody Hurley is a 87 y.o. male with history of rectal cancer last treated on 2017-experiencing increasing weakness and poor appetite.  Patient was diagnosed with COVID infection about 2 weeks ago was treated with antiviral and p.o. prednisone.  Subsequent which patient also had come to the ER on June 26 with shortness of breath and at the time patient CT angiogram showed 2 small pulmonary embolism and was placed on apixaban.  The CT scan also showed retroperitoneal lesions concerning for metastatic cancer.  Patient had subsequently followed with [name] patient's oncologist who ordered a CT abdomen pelvis confirms multiple lesions and since patient has been progressively worsening and finding it difficult to ambulate has poor appetite lost at least 20 to 30 pounds in the last month.  Patient admitted for further work-up.  On exam patient appears generally weak otherwise nonfocal.  Has mild swelling of the legs. Chest x-ray with interval worsening of right midlung and suprahilar pulmonary opacities since prior study, concern for possible CAP vs aspiration PNA.  Subjective: Pt seen in radiology for instrumental assessment of swallow function and safety Assessment / Plan / Recommendation CHL IP CLINICAL IMPRESSIONS 12/07/2020 Clinical Impression Pt presents with mild oral, moderate-severe pharyngeal dysphagia, characterized as follows: Orally, pt exhibits poor bolus formation and premature spill. Oral prep is slow and discoordinated. Pharyngeal swallow is characterized by initation of the swallow reflex at the vallecular sinus most of the time, and at the pyriform sinus intermittently. Post-swallow  residue is noted within the vallecular sinus, lateral channels, and pyriform sinus, and increases risk of penetration and aspiration during meals. Penetration was noted on nectar thick liquids during and after the swallow. Aspiration of thin liquids was  seen during the swallow, due to poor epiglottic inversion and insufficient airway protection. Npo cough response was elicited after aspiration events. Pt was noted to pull vallecular residue back into the oral cavity on several occasions. Esophageal sweep revealed it to be very slow to clear. Chin tuck and effortful swallow were not effective to clear vallecular residue. At this time, pt is at significantly high risk for aspiration with continued PO intake based on performance on today's instrumental study. Recommend dys 2 diet and nectar thick liquids, with meds crushed in puree. Also recommend Palliative Care consult given multiple comorbidities. RN and MD informed. SLP will follow for education and diet tolerance assessment.  SLP Visit Diagnosis Dysphagia, pharyngoesophageal phase (R13.14)     Impact on safety and function Severe aspiration risk;Risk for inadequate nutrition/hydration   CHL IP TREATMENT RECOMMENDATION 12/07/2020 Treatment Recommendations Therapy as outlined in treatment plan below   Prognosis 12/07/2020 Prognosis for Safe Diet Advancement Fair     CHL IP DIET RECOMMENDATION 12/07/2020 SLP Diet Recommendations Dysphagia 2 (Fine chop) solids;Nectar thick liquid Liquid Administration via Cup;Straw Medication Administration Crushed with puree Compensations Slow rate;Small sips/bites;Follow solids with liquid Postural Changes Remain semi-upright after after feeds/meals (Comment);Seated upright at 90 degrees   CHL IP OTHER RECOMMENDATIONS 12/07/2020 Recommended Consults Consider esophageal assessment Oral Care Recommendations Oral care QID Other Recommendations Order thickener from pharmacy   CHL IP FOLLOW UP RECOMMENDATIONS 12/07/2020 Follow up Recommendations Home health SLP;Skilled Nursing facility   [facility] IP FREQUENCY AND DURATION 12/07/2020 Speech Therapy Frequency (ACUTE ONLY) min 1 x/week Treatment Duration 1 week;2 weeks      CHL IP ORAL PHASE 12/07/2020 Oral Phase Impaired   CHL IP PHARYNGEAL PHASE  12/07/2020 Pharyngeal Phase Impaired   CHL IP CERVICAL ESOPHAGEAL PHASE 12/07/2020 Cervical Esophageal Phase Impaired   Celia B. Quentin Ore, [facility], [place] Speech Language Pathologist Office: [phone] Pager: [phone] Shonna Chock 12/07/2020, 2:05 PM               Medications: I have reviewed the patient's current medications.  Assessment/Plan: Stage III rectal cancer,ypT4a,N2 status post a low anterior resection in September 2017 Presenting November 2016 with a mass at 10 cm from the anus, clinical stage II by EUS-T3N0 Neoadjuvant Xeloda and radiation beginning 06/01/2015, completed 20 of 28 planned fractions, discontinued secondary to toxicity Elevated pretreatment CEA CT chest 04/10/2019-no evidence of metastatic disease, changes of interstitial lung disease, changes suggestive of cirrhosis CT chest 11/22/2020-scattered dependent groundglass airspace opacity and irregular interstitial opacity-nonspecific infectious or inflammatory, new bilateral adrenal nodules, extensive ill-defined soft tissue surrounding the right kidney suspicious for malignancy CT abdomen/pelvis 11/26/2020-bilateral adrenal masses, multinodular masslike soft tissue density throughout the right perinephric space and renal sinus, new left periaortic and retroperitoneal adenopathy, cirrhosis   Prostate cancer 1990 treated with a prostatectomy History of a TIA Carpal tunnel syndrome Palpable liver edge-ultrasound abdomen 04/10/2019-fatty infiltration of the liver, normal portal vein flow Left lung subsegmental pulmonary emboli 11/22/2020-apixaban Anorexia/weight loss COVID-19 infection June 2022 Admission 12/01/2020 with failure to thrive Elevated calcium-normal ionized calcium 12/03/2020 Retroperitoneal biopsy from 12/03/2020 consistent with large B-cell lymphoma CD20 positive, KI-67 95%, FISH panel pending     Mr. Moten has been diagnosed with large B-cell lymphoma.  He appears to have a high-grade lymphoma.  This  accounts  for his generalized weakness and anorexia over the past several months.  I discussed the diagnosis and treatment options with Mr. Borawski and his wife.  I predict his survival will be limited 2 weeks or a few months in the absence of systemic therapy.  I explained the high chance of clinical improvement with systemic therapy in this setting.  I recommend a trial of systemic therapy.  We will modify the chemotherapy regimen based on his age and current performance status.  I recommend CVP-rituximab.  Mr. Down indicates he would like to proceed with treatment.  He will plan to be discharged to a rehabilitation facility following chemotherapy with the hope to return home in a few weeks.  We can administer cycle 2 as an outpatient.  I reviewed potential toxicities associated with the CVP chemotherapy regimen including the chance of nausea/vomiting, mucositis, alopecia, tumor lysis syndrome, hematologic toxicity, infection, and bleeding.  We discussed the allergic reaction, infections, and CNS toxicity associated with rituximab.  He agrees to proceed.  He will be referred for PICC placement for administration of chemotherapy.  He will continue intravenous hydration and allopurinol.  The plan is to administer cycle 1 CVP-rituximab on 12/10/2020.  We will check an echocardiogram in anticipation of adding doxorubicin as part of "mini CHOP "with cycle 2.  Recommendations: 1.  PICC placement for chemotherapy 2.  Chemotherapy teaching per the oncology RN 3.  Continue intravenous hydration and allopurinol 4.  Cycle 1 CVP-rituximab 12/10/2020   LOS: 8 days   Betsy Coder, MD   12/09/2020, 8:26 AM

## 2020-12-09 NOTE — Progress Notes (Signed)
Occupational Therapy Treatment Patient Details Name: Cody Hurley MRN: [id number] DOB: 1926/01/13 Today's Date: 12/09/2020    History of present illness 87 yo male admitted with retroperitoneal mass, weakness, FTT. Hx of stage III rectal ca, LAR 2017, prostate ca, PE, anorexia, recent COVID   OT comments  Patient agreeable to get up to chair for breakfast. Needing min cues and min A for trunk support to sit upright from side lying position. While sitting up at edge of bed to take medication patient spit up onto gown, set up/supervision for UB dressing to change gown. Patient needing cues for hand placement and light min A to stand and take few steps to recliner. Limited eccentric control despite hands on chair with min A to slowly lower to recliner. Patient progressing well, anticipate D/C home with [facility] services if spouse able to provide current assist levels.    Follow Up Recommendations  Home health OT;Supervision/Assistance - 24 hour;Other (comment) (if spouse can provide current assist levels)    Equipment Recommendations  None recommended by OT       Precautions / Restrictions Precautions Precautions: Fall Precaution Comments: colostomy Restrictions Weight Bearing Restrictions: No       Mobility Bed Mobility Overal bed mobility: Needs Assistance Bed Mobility: Rolling;Sidelying to Sit Rolling: Min guard Sidelying to sit: HOB elevated;Min assist       General bed mobility comments: min A to elevate trunk, cue to use bed rails to assist    Transfers Overall transfer level: Needs assistance Equipment used: Rolling walker (2 wheeled) Transfers: Sit to/from Stand Sit to Stand: Min assist         General transfer comment: cues for hand placement    Balance Overall balance assessment: Needs assistance Sitting-balance support: Feet supported Sitting balance-Leahy Scale: Fair     Standing balance support: During functional activity;Bilateral upper extremity  supported Standing balance-Leahy Scale: Poor Standing balance comment: relinat on external support from RW                           ADL either performed or assessed with clinical judgement   ADL Overall ADL's : Needs assistance/impaired Eating/Feeding: Set up;Sitting   Grooming: Wash/dry face;Set up;Sitting           Upper Body Dressing : Supervision/safety;Set up Upper Body Dressing Details (indicate cue type and reason): to don clean gown after spitting up medication     Toilet Transfer: Minimal assistance;Cueing for safety;Cueing for sequencing;Stand-pivot;RW Toilet Transfer Details (indicate cue type and reason): to recliner, needing cues for hand placement to push from bed vs pulling on walker. cues to reach back for chair with limited eccentric control into sitting needing min A         Functional mobility during ADLs: Minimal assistance;Rolling walker;Cueing for safety        Cognition Arousal/Alertness: Awake/alert Behavior During Therapy: WFL for tasks assessed/performed Overall Cognitive Status: Within Functional Limits for tasks assessed                                 General Comments: very HOH with hearing aids in place.                   Pertinent Vitals/ Pain       Pain Assessment: No/denies pain         Frequency  Min 2X/week  Progress Toward Goals  OT Goals(current goals can now be found in the care plan section)  Progress towards OT goals: Progressing toward goals  Acute Rehab OT Goals Patient Stated Goal: get stronger and return home ADL Goals Additional ADL Goal #1: Patient will stand at sink to perform grooming task as evidence of improving activity tolerance Additional ADL Goal #2: patient to be able to participate in UB and LB bathing tasks with supervision and set up to be able to maintain independence at home.  Plan Discharge plan remains appropriate       AM-PAC OT "6 Clicks" Daily  Activity     Outcome Measure   Help from another person eating meals?: A Little Help from another person taking care of personal grooming?: A Little Help from another person toileting, which includes using toliet, bedpan, or urinal?: A Little Help from another person bathing (including washing, rinsing, drying)?: A Lot Help from another person to put on and taking off regular upper body clothing?: A Little Help from another person to put on and taking off regular lower body clothing?: A Lot 6 Click Score: 16    End of Session Equipment Utilized During Treatment: Gait belt;Rolling walker  OT Visit Diagnosis: Unsteadiness on feet (R26.81);Muscle weakness (generalized) (M62.81)   Activity Tolerance Patient tolerated treatment well   Patient Left in chair;with call bell/phone within reach;with chair alarm set   Nurse Communication Mobility status        Time: 5625-6389 OT Time Calculation (min): 21 min  Charges: OT General Charges $OT Visit: 1 Visit OT Treatments $Self Care/Home Management : 8-22 mins  Delbert Phenix OT OT pager: [phone]   Rosemary Holms 12/09/2020, 12:48 PM

## 2020-12-09 NOTE — Progress Notes (Signed)
Provided pt with chemotherapy educational notebook. Also provided pt with specific educational information in regards to CVP-rituximab. Encouraged pt to ask oncology team in any questions or concerns come up throughout the process. Voices no questions or concerns at this time.

## 2020-12-09 NOTE — Procedures (Signed)
Successful placement of dual lumen PICC line to right basilic vein. Length 37 cm Tip at lower SVC/RA PICC capped No complications Ready for use.  EBL < 5 mL   Ascencion Dike PA-C 12/09/2020 4:05 PM

## 2020-12-09 NOTE — Progress Notes (Signed)
Patient ID: Cody Hurley, male   DOB: 08-Dec-1925, 87 y.o.   MRN: [id number]    Progress Note from the Palliative Medicine Team at [facility]   Patient Name: Cody Hurley        Date: 12/09/2020 DOB: [birth year]  Age: 87 y.o. MRN#: [id number] Attending Physician: Georgette Shell, MD Primary Care Physician: [name], MD Admit Date: 12/01/2020   Medical records reviewed   87 y.o. male  admitted on 12/01/2020 with Cody Hurley is a 87 y.o. male with hx of prostate ca in 1990s, rectal cancer in 2019 s/p low anterior resection with prior chemoradiation, and recently left lung subsegmental pulmonary emboli. He reports having unintentional weight loss and malaise and weakness/failure to thrive. He was recovering from covid roughly 2 weeks ago, complicated by PE. The CT angio for evaluation of PE also showed retroperitoneal lesions concerning for new metastatic cancer. He was admitted on 7/5, underwent biopsy with path still pending. Patient reports having dry mouth, no cough. ID asked to evaluate for reason of elevated LA. On labs has persistent LA of > 11. Procalcitonin is negative   CT showed:  Bulky multinodular soft tissue density in the right Peri and pararenal spaces concerning for metastatic disease and/or lymphoma. Additional periaortic/retroperitoneal adenopathy and conspicuous nodular thickening of the adrenal glands compatible with additional metastatic deposits.   Patient and family face treatment option decisions, advanced directive decisions and anticipatory care needs.  This NP visited patient at the bedside as a follow up to  yesterday's [place].   Wife and daughter at bedside.  Continued conversation regarding current medical situation; diagnosis, prognosis, goals of care, end-of-life wishes, disposition and options.   Plan of care -DNR/DNI-documented today -Patient is open to all offered and available medical interventions to prolong life       -as discussed  today with [name] PICC line will be placed, and plan is for cycle 1 of rituximab tomorrow -Patient and family are hopeful for short-term SNF for rehabilitation prior to discharge home.  I placed call to PT and await callback   Encourage both patient and family to consider best case scenario versus worst-case scenario for this patient at this time in this situation, now on into the future.  Patient is high risk for decompensation  Education offered on home hospice benefit.  MOST form introduced.  Wife to look for advance care planning documents and bring to hospital for scanning.   Discussed with patient the importance of continued conversation with family and the medical providers regarding overall plan of care and treatment options,  ensuring decisions are within the context of the patients values and GOCs.  Questions and concerns addressed   Discussed with Dr Zigmund Daniel   Total time spent on the unit was 60 minutes  Greater than 50% of the time was spent in counseling and coordination of care  Wadie Lessen NP  Palliative Medicine Team Team Phone # [phone] Pager [phone]

## 2020-12-10 ENCOUNTER — Inpatient Hospital Stay (HOSPITAL_COMMUNITY): Payer: Medicare Other

## 2020-12-10 DIAGNOSIS — C8513 Unspecified B-cell lymphoma, intra-abdominal lymph nodes: Secondary | ICD-10-CM | POA: Diagnosis not present

## 2020-12-10 DIAGNOSIS — R627 Adult failure to thrive: Secondary | ICD-10-CM | POA: Diagnosis not present

## 2020-12-10 DIAGNOSIS — R531 Weakness: Secondary | ICD-10-CM | POA: Diagnosis not present

## 2020-12-10 DIAGNOSIS — R1900 Intra-abdominal and pelvic swelling, mass and lump, unspecified site: Secondary | ICD-10-CM | POA: Diagnosis not present

## 2020-12-10 LAB — COMPREHENSIVE METABOLIC PANEL
ALT: 33 U/L (ref 0–44)
AST: 63 U/L — ABNORMAL HIGH (ref 15–41)
Albumin: 2.7 g/dL — ABNORMAL LOW (ref 3.5–5.0)
Alkaline Phosphatase: 71 U/L (ref 38–126)
Anion gap: 15 (ref 5–15)
BUN: 13 mg/dL (ref 8–23)
CO2: 19 mmol/L — ABNORMAL LOW (ref 22–32)
Calcium: 9.9 mg/dL (ref 8.9–10.3)
Chloride: 100 mmol/L (ref 98–111)
Creatinine, Ser: 0.53 mg/dL — ABNORMAL LOW (ref 0.61–1.24)
GFR, Estimated: >60 mL/min (ref >60)
Glucose, Bld: 65 mg/dL — ABNORMAL LOW (ref 70–99)
Potassium: 4 mmol/L (ref 3.5–5.1)
Sodium: 134 mmol/L — ABNORMAL LOW (ref 135–145)
Total Bilirubin: 1.1 mg/dL (ref 0.3–1.2)
Total Protein: 6.1 g/dL — ABNORMAL LOW (ref 6.5–8.1)

## 2020-12-10 LAB — CBC
HCT: 41.6 % (ref 39.0–52.0)
Hemoglobin: 13.6 g/dL (ref 13.0–17.0)
MCH: 33.9 pg (ref 26.0–34.0)
MCHC: 32.7 g/dL (ref 30.0–36.0)
MCV: 103.7 fL — ABNORMAL HIGH (ref 80.0–100.0)
Platelets: 159 10*3/uL (ref 150–400)
RBC: 4.01 MIL/uL — ABNORMAL LOW (ref 4.22–5.81)
RDW: 17.8 % — ABNORMAL HIGH (ref 11.5–15.5)
WBC: 6.1 10*3/uL (ref 4.0–10.5)
nRBC: 0 % (ref 0.0–0.2)

## 2020-12-10 LAB — GLUCOSE, CAPILLARY
Glucose-Capillary: 61 mg/dL — ABNORMAL LOW (ref 70–99)
Glucose-Capillary: 74 mg/dL (ref 70–99)
Glucose-Capillary: 87 mg/dL (ref 70–99)
Glucose-Capillary: 70 mg/dL (ref 70–99)
Glucose-Capillary: 74 mg/dL (ref 70–99)
Glucose-Capillary: 79 mg/dL (ref 70–99)

## 2020-12-10 LAB — LACTATE DEHYDROGENASE: LDH: 516 U/L — ABNORMAL HIGH (ref 98–192)

## 2020-12-10 MED ORDER — SODIUM CHLORIDE 0.9 % IV SOLN: INTRAVENOUS | Status: DC

## 2020-12-10 MED ORDER — PREDNISONE 20 MG PO TABS
20.0000 mg | ORAL_TABLET | Freq: Every day | ORAL | Status: DC
Start: 2020-12-10 — End: 2020-12-11
  Administered 2020-12-10 – 2020-12-11 (×2): 20 mg via ORAL
  Filled 2020-12-10 (×2): qty 1

## 2020-12-10 MED ORDER — CHLORHEXIDINE GLUCONATE CLOTH 2 % EX PADS
6.0000 | MEDICATED_PAD | Freq: Every day | CUTANEOUS | Status: DC
  Administered 2020-12-10 – 2020-12-19 (×11): 6 via TOPICAL

## 2020-12-10 MED ORDER — SODIUM CHLORIDE 0.9 % IV SOLN: 375.0000 mg/m2 | Freq: Once | INTRAVENOUS | Status: DC

## 2020-12-10 MED ORDER — SODIUM CHLORIDE 0.9 % IV SOLN: 600.0000 mg/m2 | Freq: Once | INTRAVENOUS | Status: DC

## 2020-12-10 MED ORDER — SODIUM CHLORIDE 0.9 % IV SOLN
10.0000 mg | Freq: Once | INTRAVENOUS | Status: DC
  Filled 2020-12-10: qty 1

## 2020-12-10 MED ORDER — VINCRISTINE SULFATE CHEMO INJECTION 1 MG/ML: 1.0000 mg | Freq: Once | INTRAVENOUS | Status: DC

## 2020-12-10 MED ORDER — ACETAMINOPHEN 325 MG PO TABS
650.0000 mg | ORAL_TABLET | Freq: Once | ORAL | Status: DC
Start: 2020-12-10 — End: 2020-12-14
  Filled 2020-12-10: qty 2

## 2020-12-10 MED ORDER — DIPHENHYDRAMINE HCL 25 MG PO CAPS
25.0000 mg | ORAL_CAPSULE | Freq: Once | ORAL | Status: DC
  Filled 2020-12-10: qty 1

## 2020-12-10 MED ORDER — PALONOSETRON HCL INJECTION 0.25 MG/5ML
0.2500 mg | Freq: Once | INTRAVENOUS | Status: DC
  Filled 2020-12-10: qty 5

## 2020-12-10 NOTE — Progress Notes (Signed)
PROGRESS NOTE    Cody Hurley  ZMO:[id number] DOB: [birth year] DOA: 12/01/2020 PCP: [name], MD   Brief Narrative: HPI per [name] is a 88 y.o. male with history of rectal cancer last treated on 2017-experiencing increasing weakness and poor appetite.  Patient was diagnosed with COVID infection about 2 weeks ago was treated with antiviral and p.o. prednisone.  Subsequent which patient also had come to the ER on June 26 with shortness of breath and at the time patient CT angiogram showed 2 small pulmonary embolism and was placed on apixaban.  The CT scan also showed retroperitoneal lesions concerning for metastatic cancer.  Patient had subsequently followed with [name] patient's oncologist who ordered a CT abdomen pelvis confirms multiple lesions and since patient has been progressively worsening and finding it difficult to ambulate has poor appetite lost at least 20 to 30 pounds in the last month.  Patient admitted for further work-up.  On exam patient appears generally weak otherwise nonfocal.  Has mild swelling of the legs.  Assessment & Plan:   Principal Problem:   Retroperitoneal mass Active Problems:   Pulmonary embolism (HCC)   FTT (failure to thrive) in adult   Hypercalcemia   History of rectal cancer   History of prostate cancer   COVID-19 virus infection: Recent COVID-19 infection status posttreatment   Acidosis   Lactic acidosis   Non-Hodgkin lymphoma (HCC)    #1 retroperitoneal renal mass concerning for metastatic cancer-status postbiopsy consistent with aggressive B-cell lymphoma.  Patient was due to start his chemotherapy today.  However wife who is a healthcare power of attorney is very concerned that patient is not going to do well with chemotherapy.  He appears dehydrated much weaker than yesterday.  She wishes to take him home with hospice once arrangements are put in place.  #2 recent PE on Eliquis.  #3 history of rectal cancer and  prostate cancer followed by [name].  #4 hypercalcemia likely secondary to malignancy however will DC multivitamins.  #5 recent COVID 19 he received treatment with Decadron and remdesivir.  Currently he is on room air.  #6 lactic acidosis likely secondary to malignancy.  ID was consulted.  Antibiotics stopped.  MRSA PCR negative.  CT abdomen and pelvis no acute abnormalities.  Chest x-ray showed interval worsening right midlung and suprahilar pulmonary opacities since the prior study. Anion gap 15 and trending down  #7 hypoglycemia-CBG (last 3)  Recent Labs    12/10/20 0659 12/10/20 0748 12/10/20 1146  GLUCAP 74 87 70          Nutrition Problem: Inadequate oral intake Etiology: acute illness     Signs/Symptoms: per patient/family report    Interventions: Ensure Enlive (each supplement provides 350kcal and 20 grams of protein), MVI, Magic cup  Estimated body mass index is 21.21 kg/m as calculated from the following:   Height as of this encounter: 5\' 7"  (1.702 m).   Weight as of this encounter: 61.4 kg.  DVT prophylaxis: Eliquis  code Status: DNR Family Communication: Wife at bedside disposition Plan:  Status is: Inpatient  Remains inpatient appropriate because:IV treatments appropriate due to intensity of illness or inability to take PO  Dispo: The patient is from: Home              Anticipated d/c is to: Home              Patient currently is not medically stable to d/c.   Difficult to place  patient No   Consultants:  Oncology palliative care interventional radiology infectious disease  Procedures: CT-guided needle biopsy of the right retroperitoneal mass 12/03/2020  Antimicrobials: None  Subjective: Resting in bed wife at bedside Reports he is very weak seen by palliative care CODE STATUS changed to DNR  Objective: Vitals:   12/09/20 0510 12/09/20 1408 12/09/20 1900 12/10/20 0505  BP: 114/77 119/73 127/76 128/70  Pulse: 93 93 93 89  Resp: 16 15  20 20   Temp: 97.8 F (36.6 C) (!) 97 F (36.1 C) 97.6 F (36.4 C) 98 F (36.7 C)  TempSrc: Oral  Oral Oral  SpO2: 94% 93% 94% 92%  Weight:      Height:        Intake/Output Summary (Last 24 hours) at 12/10/2020 1353 Last data filed at 12/10/2020 1228 Gross per 24 hour  Intake 120 ml  Output 2500 ml  Net -2380 ml    Filed Weights   12/04/20 0406  Weight: 61.4 kg    Examination:  General exam: Appears weaker Respiratory system: Scattered rhonchi o auscultation. Respiratory effort normal. Cardiovascular system: S1 & S2 heard, RRR. No JVD, murmurs, rubs, gallops or clicks. No pedal edema. Gastrointestinal system: Abdomen is nondistended, soft and nontender. No organomegaly or masses felt. Normal bowel sounds heard. Central nervous system: Alert and oriented. No focal neurological deficits. Extremities: 1+ bilateral pitting edema  skin: No rashes, lesions or ulcers Psychiatry: Judgement and insight appear normal. Mood & affect appropriate.     Data Reviewed: I have personally reviewed following labs and imaging studies  CBC: Recent Labs  Lab 12/06/20 0558 12/07/20 0459 12/08/20 0501 12/08/20 0508 12/09/20 0516 12/10/20 0527  WBC 4.9 5.7  --  6.0 5.5 6.1  NEUTROABS  --   --  4.0  --  3.8  --   HGB 13.2 13.1  --  13.1 13.6 13.6  HCT 39.9 39.2  --  40.2 41.2 41.6  MCV 103.1* 102.1*  --  104.4* 105.1* 103.7*  PLT 184 185  --  153 165 631    Basic Metabolic Panel: Recent Labs  Lab 12/04/20 0201 12/05/20 0546 12/06/20 0558 12/07/20 0459 12/08/20 0508 12/09/20 0516 12/10/20 0527  NA 135   < > 136 135 137 137 134*  K 3.3*   < > 3.3* 3.3* 3.3* 4.2 4.0  CL 100   < > 100 100 102 103 100  CO2 19*   < > 17* 16* 14* 13* 19*  GLUCOSE 80   < > 96 70 74 61* 65*  BUN 12   < > 13 11 9 11 13   CREATININE 0.62   < > 0.66 0.54* 0.62  0.50* 0.56* 0.53*  CALCIUM 9.6   < > 9.8 9.3 9.5 10.1 9.9  MG 2.0  --  2.0 1.9 1.9 2.0  --   PHOS 2.2*  --  2.4* 2.4* 2.4* 2.6  --    < >  = values in this interval not displayed.    GFR: Estimated Creatinine Clearance: 50.1 mL/min (A) (by C-G formula based on SCr of 0.53 mg/dL (L)). Liver Function Tests: Recent Labs  Lab 12/06/20 0558 12/07/20 0459 12/08/20 0508 12/09/20 0516 12/10/20 0527  AST  --  65*  --   --  63*  ALT  --  33  --   --  33  ALKPHOS  --  69  --   --  71  BILITOT  --  0.7  --   --  1.1  PROT  --  5.6*  --   --  6.1*  ALBUMIN 2.5* 2.4* 2.6* 2.5* 2.7*    No results for input(s): LIPASE, AMYLASE in the last 168 hours. No results for input(s): AMMONIA in the last 168 hours. Coagulation Profile: No results for input(s): INR, PROTIME in the last 168 hours. Cardiac Enzymes: No results for input(s): CKTOTAL, CKMB, CKMBINDEX, TROPONINI in the last 168 hours. BNP (last 3 results) No results for input(s): PROBNP in the last 8760 hours. HbA1C: No results for input(s): HGBA1C in the last 72 hours. CBG: Recent Labs  Lab 12/09/20 2215 12/10/20 0634 12/10/20 0659 12/10/20 0748 12/10/20 1146  GLUCAP 85 61* 74 87 70    Lipid Profile: No results for input(s): CHOL, HDL, LDLCALC, TRIG, CHOLHDL, LDLDIRECT in the last 72 hours. Thyroid Function Tests: No results for input(s): TSH, T4TOTAL, FREET4, T3FREE, THYROIDAB in the last 72 hours. Anemia Panel: No results for input(s): VITAMINB12, FOLATE, FERRITIN, TIBC, IRON, RETICCTPCT in the last 72 hours. Sepsis Labs: Recent Labs  Lab 12/06/20 1029 12/06/20 1342 12/06/20 1615 12/07/20 0459 12/08/20 0508  PROCALCITON  --   --   --  <0.10 <0.10  LATICACIDVEN >11.0* >11.0* >11.0* >11.0*  --      Recent Results (from the past 240 hour(s))  Culture, Urine     Status: None   Collection Time: 12/03/20  7:00 AM   Specimen: Urine, Catheterized  Result Value Ref Range Status   Specimen Description   Final    URINE, CATHETERIZED Performed at [facility], [place] [address] [place], [place] [address]    Special Requests   Final     NONE Performed at [facility], [place] [address] [place], [place] [address]    Culture   Final    NO GROWTH Performed at [facility], [place] [address] [place], [place] [address]    Report Status 12/05/2020 FINAL  Final  Culture, blood (Routine X 2) w Reflex to ID Panel     Status: None (Preliminary result)   Collection Time: 12/06/20  1:42 PM   Specimen: BLOOD  Result Value Ref Range Status   Specimen Description   Final    BLOOD RIGHT ANTECUBITAL Performed at [place] [address], [place], [place] [address]    Special Requests   Final    BOTTLES DRAWN AEROBIC ONLY Blood Culture adequate volume Performed at [place] [address] [place], [place] [address]    Culture   Final    NO GROWTH 4 DAYS Performed at [facility], [place] [address] [place], [place] [address]    Report Status PENDING  Incomplete  Culture, blood (Routine X 2) w Reflex to ID Panel     Status: None (Preliminary result)   Collection Time: 12/06/20  4:11 PM   Specimen: BLOOD  Result Value Ref Range Status   Specimen Description   Final    BLOOD RIGHT ANTECUBITAL Performed at [place] [address], [place], [place] [address]    Special Requests   Final    BOTTLES DRAWN AEROBIC AND ANAEROBIC Blood Culture results may not be optimal due to an inadequate volume of blood received in culture bottles Performed at [facility] [address], [place],  [address]    Culture   Final    NO GROWTH 4 DAYS Performed at [facility], [place] [address],  [place], [place] [address]    Report Status PENDING  Incomplete  Culture, Urine     Status: None   Collection Time: 12/07/20 10:17 AM   Specimen: Urine, Clean Catch  Result Value Ref Range Status   Specimen Description   Final    URINE, CLEAN CATCH Performed at [facility], [place] [address], [place], [place]  [address]    Special Requests   Final    NONE Performed at [facility], [place] [address] [place], [place] [address]    Culture   Final    NO GROWTH Performed at [facility], [place] [address] [place], [place] [address]    Report Status 12/08/2020 FINAL  Final  MRSA Next Gen by PCR, Nasal     Status: None   Collection Time: 12/07/20 11:00 AM   Specimen: Nasal Mucosa; Nasal Swab  Result Value Ref Range Status   MRSA by PCR Next Gen NOT DETECTED NOT DETECTED Final    Comment: (NOTE) The GeneXpert MRSA Assay (FDA approved for NASAL specimens only), is one component of a comprehensive MRSA colonization surveillance program. It is not intended to diagnose MRSA infection nor to guide or monitor treatment for MRSA infections. Test performance is not FDA approved in patients less than 58 years old. Performed at [facility], [place] [address] [place], [place] [address]           Radiology Studies: [place] EKG SITE RITE  Result Date: 12/09/2020 If Site Rite image not attached, placement could not be confirmed due to current cardiac rhythm.       Scheduled Meds:  acetaminophen  650 mg Oral Once   allopurinol  300 mg Oral Daily   apixaban  5 mg Oral BID   Chlorhexidine Gluconate Cloth  6 each Topical Daily   diphenhydrAMINE  25 mg Oral Once   feeding supplement  237 mL Oral BID BM   food thickener  1 packet Oral TID WC, HS, 0200   palonosetron  0.25 mg Intravenous Once   sodium bicarbonate  1,300 mg Oral TID   Continuous Infusions:  sodium chloride 75 mL/hr at 12/10/20 0835   dexamethasone (DECADRON) IVPB ([facility])       LOS: 9 days    Time spent: 40 min    Georgette Shell, MD  12/10/2020, 1:53 PM

## 2020-12-10 NOTE — Progress Notes (Addendum)
Speech Language Pathology Treatment: Dysphagia  Patient Details Name: Cody Hurley MRN: [id number] DOB: 05/27/1926 Today's Date: 12/10/2020 Time: 6967-8938 SLP Time Calculation (min) (ACUTE ONLY): 27 min  Assessment / Plan / Recommendation Clinical Impression  Pt today for follow up regarding dysphagia for education, compensations and tolerance assessment.    Pt has decided to not pursue chemotherapy and plan is to go home with hospice.  Initially pt was asleep but awoke during session- very xerostomic from open mouth breathing.      SLP purchased pt a [employer] and he consumed 1/2 Snickers and water via straw.  Intermittent cough noted  - most notably after liquid swallows - likely due to aspiration.  Pt reports displeasure with his diet - and given plan is home with hospice, recommend advance diet.    SLP educated wife to concept of comfort feedng and possible modifications as disease process continues, respiratory status impaired and/or mentation is impaired- eg changing diet and/or administration to maximize comfort/enjoyment, etc.   Reviewed aspiration precautions and advised to maintain strength of cough and "hock" - expectoration for maximal airway protection. Provided heimlich manuver information in writing.   Using teach back, wife informed.   Given hospice dc plan, no SlP follow up indicated.  Advised wife and pt that SLP wrote for plain water to be given between meals for hydration/QOL evening of 12/09/2020 due to pH neutral status of water. Pt and wife agreeable to plan. Thanks for allowing me to help with this most kind patient and family.    HPI HPI: Cody Hurley is a 87 y.o. male with history of rectal cancer last treated on 2017-experiencing increasing weakness and poor appetite.  Patient was diagnosed with COVID infection about 2 weeks ago was treated with antiviral and p.o. prednisone.  Subsequent which patient also had come to the ER on June 26 with shortness of breath and  at the time patient CT angiogram showed 2 small pulmonary embolism and was placed on apixaban.  The CT scan also showed retroperitoneal lesions concerning for metastatic cancer.  Patient had subsequently followed with [name] patient's oncologist who ordered a CT abdomen pelvis confirms multiple lesions and since patient has been progressively worsening and finding it difficult to ambulate has poor appetite lost at least 20 to 30 pounds in the last month.  Patient admitted for further work-up.  On exam patient appears generally weak otherwise nonfocal.  Has mild swelling of the legs. Chest x-ray with interval worsening of right midlung and suprahilar pulmonary opacities since prior study, concern for possible CAP vs aspiration PNA.  Follow up for education regarding testing, recommended compensations re: pt's swallowing function and to establish premorbid status re: swallowing. .  Per notes, pt is now will not have chemo completed and plan is to dc home with hospice.      SLP Plan  Discharge SLP treatment due to (comment) (pt to dc with hospice)       Recommendations  Diet recommendations: Regular;Thin liquid Liquids provided via: Cup;Straw Medication Administration: Other (Comment) (as tolerated) Supervision: Patient able to self feed Compensations: Slow rate;Small sips/bites;Follow solids with liquid;Minimize environmental distractions Postural Changes and/or Swallow Maneuvers: Seated upright 90 degrees;Upright 30-60 min after meal (coug and expectorate if pt reflexively coughing)                Oral Care Recommendations: Oral care QID SLP Visit Diagnosis: Dysphagia, pharyngoesophageal phase (R13.14);Dysphagia, oropharyngeal phase (R13.12) Plan: Discharge SLP treatment due to (comment) (pt to dc  with hospice)       [place], MS [place] Office [phone] Pager [phone]   Macario Golds 12/10/2020, 6:12 PM

## 2020-12-10 NOTE — Progress Notes (Addendum)
IP PROGRESS NOTE  Subjective:   Mr. Tolan continues to have a dry mouth.  Reports pain in his rectal area.  Objective: Vital signs in last 24 hours: Blood pressure 128/70, pulse 89, temperature 98 F (36.7 C), temperature source Oral, resp. rate 20, height 5' 7"  (1.702 m), weight 61.4 kg, SpO2 92 %.  Intake/Output from previous day: 07/13 0701 - 07/14 0700 In: 475 [P.O.:475] Out: 2000 [Urine:2000]  Physical Exam:  HEENT: Dryness of the tongue and buccal mucosa, no thrush Lungs: Inspiratory rhonchi at the lower posterior chest bilaterally, no respiratory distress Cardiac: Irregular Abdomen: No hepatosplenomegaly, no mass, nontender, left lower quadrant colostomy Ractal: No excoriation or masses Extremities: Trace pitting edema to lower leg bilaterally Neurologic: Alert, follows commands, oriented   Lab Results: Recent Labs    12/09/20 0516 12/10/20 0527  WBC 5.5 6.1  HGB 13.6 13.6  HCT 41.2 41.6  PLT 165 159    BMET Recent Labs    12/09/20 0516 12/10/20 0527  NA 137 134*  K 4.2 4.0  CL 103 100  CO2 13* 19*  GLUCOSE 61* 65*  BUN 11 13  CREATININE 0.56* 0.53*  CALCIUM 10.1 9.9  Ionized calcium on 12/03/2020: 5.1  Lab Results  Component Value Date   CEA1 2.57 11/18/2020    Studies/Results: [place] EKG SITE RITE  Result Date: 12/09/2020 If Site Rite image not attached, placement could not be confirmed due to current cardiac rhythm.   Medications: I have reviewed the patient's current medications.  Assessment/Plan: Stage III rectal cancer,ypT4a,N2 status post a low anterior resection in September 2017 Presenting November 2016 with a mass at 10 cm from the anus, clinical stage II by EUS-T3N0 Neoadjuvant Xeloda and radiation beginning 06/01/2015, completed 20 of 28 planned fractions, discontinued secondary to toxicity Elevated pretreatment CEA CT chest 04/10/2019-no evidence of metastatic disease, changes of interstitial lung disease, changes suggestive of  cirrhosis CT chest 11/22/2020-scattered dependent groundglass airspace opacity and irregular interstitial opacity-nonspecific infectious or inflammatory, new bilateral adrenal nodules, extensive ill-defined soft tissue surrounding the right kidney suspicious for malignancy CT abdomen/pelvis 11/26/2020-bilateral adrenal masses, multinodular masslike soft tissue density throughout the right perinephric space and renal sinus, new left periaortic and retroperitoneal adenopathy, cirrhosis   Prostate cancer 1990 treated with a prostatectomy History of a TIA Carpal tunnel syndrome Palpable liver edge-ultrasound abdomen 04/10/2019-fatty infiltration of the liver, normal portal vein flow Left lung subsegmental pulmonary emboli 11/22/2020-apixaban Anorexia/weight loss COVID-19 infection June 2022 Admission 12/01/2020 with failure to thrive Elevated calcium-normal ionized calcium 12/03/2020 Retroperitoneal biopsy from 12/03/2020 consistent with large B-cell lymphoma CD20 positive, KI-67 95%, FISH panel pending Cycle 1 CVP-rituximab 12/10/2020    Mr. Geister appears unchanged.  He has been diagnosed with large B-cell lymphoma.  He appears to have a high-grade lymphoma.  This accounts for his generalized weakness and anorexia over the past several months.  He has a high chance of clinical improvement with systemic therapy and we recommend a trial of systemic chemotherapy.  Recommend CVP-rituximab.  We will modify chemotherapy regimen based on his age and performance status.  He would like to proceed with treatment.    Potential toxicities associated with his chemotherapy regimen have been discussed including but not limited to the chance of nausea/vomiting, mucositis, alopecia, tumor lysis syndrome, hematologic toxicity, infection, and bleeding.  We also discussed the potential for allergic reaction, infections, and CNS toxicity associated with rituximab.  He agrees to proceed.  We will start him back on normal saline  at 75  cc/h to reduce the risk of tumor lysis syndrome.  Echocardiogram has been ordered anticipation of adding doxorubicin as part of "mini CHOP" with cycle #2. He will plan to be discharged to a rehabilitation facility following chemotherapy with the hope to return home in a few weeks.  We can administer cycle 2 as an outpatient.  Recommendations: 1.  Restart hydration with normal saline at 75 cc/h. 2.  Continue allopurinol. 3.  He will proceed with cycle #1 CVP-rituximab today.   LOS: 9 days   Mikey Bussing, NP   12/10/2020, 7:03 AM Mr. Larsen was interviewed and examined.  He appears stable.  I saw him at approximately 7:15 AM.  The plan was to begin CBP-rituximab today.  I had discussed this with Mr. Cumbie and his wife yesterday and again today.  I received a message later in the morning indicating Mr. Yearsley had decided against chemotherapy.  I will discuss treatment options again with him in the a.m. on 12/11/2020.  If he declines systemic therapy I will recommend hospice care.  I was present for greater than 50% of today's visit.  I performed medical decision making.

## 2020-12-10 NOTE — Progress Notes (Signed)
Patient ID: Cody Hurley, male   DOB: 04-13-1926, 87 y.o.   MRN: [id number]    Progress Note from the Palliative Medicine Team at [facility]   Patient Name: Cody Hurley        Date: 12/10/2020 DOB: 07-02-1925  Age: 87 y.o. MRN#: [id number] Attending Physician: Georgette Shell, MD Primary Care Physician: [name], MD Admit Date: 12/01/2020   Medical records reviewed   87 y.o. male  admitted on 12/01/2020 with Cody Hurley is a 87 y.o. male with hx of prostate ca in 1990s, rectal cancer in 2019 s/p low anterior resection with prior chemoradiation, and recently left lung subsegmental pulmonary emboli. He reports having unintentional weight loss and malaise and weakness/failure to thrive. He was recovering from covid roughly 2 weeks ago, complicated by PE. The CT angio for evaluation of PE also showed retroperitoneal lesions concerning for new metastatic cancer. He was admitted on 7/5, underwent biopsy.  He has been diagnosed with large B-cell lymphoma.  He appears to have a high-grade lymphoma.   CT showed:  Bulky multinodular soft tissue density in the right Peri and pararenal spaces concerning for metastatic disease and/or lymphoma. Additional periaortic/retroperitoneal adenopathy and conspicuous nodular thickening of the adrenal glands compatible with additional metastatic deposits.   Plan as of yesterday was to move forward with chemotherapy treatment, hoping for improvement.   This NP visited patient at the bedside as a follow up for palliative medicine needs and emotional support.  Wife and friend/Cody Hurley at bedside.  Continued conversation regarding current medical situation; diagnosis, prognosis, goals of care, end-of-life wishes, disposition and options.  Both patient's wife and caregiver/friend/Cody Hurley see a dramatic change in Cody Hurley today.  He is much weaker, not interested in eating and intermittently confused.  After further consideration decision has been made to not  move forward with chemotherapy, shift to a more comfort path and proceed over the next several days with the plan to discharge home with hospice.   They have shared this information with the attending/Dr. Zigmund Daniel.  Plan of care -DNR/DNI-documented today -no further cancer treatment/chemotherapy  - avoid rehospitalization  -No artificial feeding now or in the future -Continue IV hydration through hospital stay and then no further IV fluids once patient is discharged -D/c un-necessary medications on discharge  - home with hospice in [place] offered on home hospice benefit.  Education offered on the difference between aggressive medical intervention path and a palliative comfort path for this patient at this time in this situation.  Questions and concerns addressed   Discussed with Dr Zigmund Daniel and Dr [name] via secure chat  Total time spent on the unit was 60 minutes  This nurse practitioner informed  the patient/family and the attending that I will be out of the hospital until Monday morning.    Dr Domingo Cocking will be following thru the week-end    # [phone]   Greater than 50% of the time was spent in counseling and coordination of care  Wadie Lessen NP  Palliative Medicine Team Team Phone # [phone] Pager [phone]

## 2020-12-10 NOTE — Progress Notes (Addendum)
Nutrition Follow-up  INTERVENTION:   -Ensure Enlive po BID, each supplement provides 350 kcal and 20 grams of protein   -Magic cup TID with meals, each supplement provides 290 kcal and 9 grams of protein  -Needs updated weight for admission   NUTRITION DIAGNOSIS:   Inadequate oral intake related to acute illness as evidenced by per patient/family report.  Ongoing.  GOAL:   Patient will meet greater than or equal to 90% of their needs  Progressing.  MONITOR:   PO intake, Supplement acceptance, Labs, Weight trends, I & O's  ASSESSMENT:   87 y.o. male with history of rectal cancer last treated on 2017-experiencing increasing weakness and poor appetite.  Patient was diagnosed with COVID infection about 2 weeks ago was treated with antiviral and p.o. prednisone.  Subsequent which patient also had come to the ER on June 26 with shortness of breath and at the time patient CT angiogram showed 2 small pulmonary embolism and was placed on apixaban.  The CT scan also showed retroperitoneal lesions concerning for metastatic cancer.  7/7: s/p biopsy of rt peritoneal mass -reveals large B-cell lymphoma 7/11: MBS  Patient consumed 75-100% of meals prior to today. This morning did not consume any breakfast. Plan is to start chemotherapy today -Addendum: Per RN, pt has opted against chemotherapy. Accepting Ensures. SLP evaluated 7/12 and recommended pt continue dysphagia 2 diet with nectar thick liquids.   Admission weight: 135 lbs. Needs updated weight for admission.  Medications reviewed.  Labs reviewed:  CBGs: 61-139 Low Na  Diet Order:   Diet Order             DIET DYS 2 Room service appropriate? Yes; Fluid consistency: Nectar Thick  Diet effective now                   EDUCATION NEEDS:   No education needs have been identified at this time  Skin:  Skin Assessment: Reviewed RN Assessment  Last BM:  7/13 -colostomy  Height:   Ht Readings from Last 1 Encounters:   12/04/20 5\' 7"  (1.702 m)    Weight:   Wt Readings from Last 1 Encounters:  12/04/20 61.4 kg    BMI:  Body mass index is 21.21 kg/m.  Estimated Nutritional Needs:   Kcal:  1800-2000  Protein:  85-95g  Fluid:  2L/day  Clayton Bibles, MS, RD, LDN Inpatient Clinical Dietitian Contact information available via Amion

## 2020-12-10 NOTE — Care Management Important Message (Signed)
Important Message  Patient Details IM Letter placed in Patient's room Name: Cody Hurley MRN: [id number] Date of Birth: 1926-04-27   Medicare Important Message Given:  Yes     Kerin Salen 12/10/2020, 10:46 AM

## 2020-12-10 NOTE — Progress Notes (Signed)
2D echo attempted, RN Jenny Reichmann says to cancel echo.   Cody Hurley 12/10/2020, 1:20 PM

## 2020-12-10 NOTE — Progress Notes (Signed)
Hypoglycemic Event  CBG: 61  Treatment: 8 oz juice/soda  Symptoms: None  Follow-up CBG: 74    Time: 0659   Possible Reasons for Event: Unknown  Comments/MD notified: Sherill     Elgie Landino A Celsey Asselin

## 2020-12-11 ENCOUNTER — Inpatient Hospital Stay (HOSPITAL_COMMUNITY): Payer: Medicare Other

## 2020-12-11 DIAGNOSIS — Z0189 Encounter for other specified special examinations: Secondary | ICD-10-CM

## 2020-12-11 DIAGNOSIS — R1900 Intra-abdominal and pelvic swelling, mass and lump, unspecified site: Secondary | ICD-10-CM | POA: Diagnosis not present

## 2020-12-11 LAB — ECHOCARDIOGRAM COMPLETE
AR max vel: 1.75 cm2
AV Area VTI: 1.79 cm2
AV Area mean vel: 1.7 cm2
AV Mean grad: 12 mmHg
AV Peak grad: 23 mmHg
Ao pk vel: 2.4 m/s
Area-P 1/2: 3.15 cm2
Calc EF: 64.6 %
Height: 67 in
S' Lateral: 2 cm
Single Plane A2C EF: 59.9 %
Single Plane A4C EF: 69.8 %
Weight: 2166.4 oz

## 2020-12-11 LAB — GLUCOSE, CAPILLARY
Glucose-Capillary: 70 mg/dL (ref 70–99)
Glucose-Capillary: 98 mg/dL (ref 70–99)
Glucose-Capillary: 121 mg/dL — ABNORMAL HIGH (ref 70–99)
Glucose-Capillary: 128 mg/dL — ABNORMAL HIGH (ref 70–99)

## 2020-12-11 LAB — CULTURE, BLOOD (ROUTINE X 2)
Culture: NO GROWTH
Culture: NO GROWTH
Special Requests: ADEQUATE

## 2020-12-11 LAB — COMPREHENSIVE METABOLIC PANEL
ALT: 30 U/L (ref 0–44)
AST: 57 U/L — ABNORMAL HIGH (ref 15–41)
Albumin: 2.3 g/dL — ABNORMAL LOW (ref 3.5–5.0)
Alkaline Phosphatase: 56 U/L (ref 38–126)
Anion gap: 17 — ABNORMAL HIGH (ref 5–15)
BUN: 12 mg/dL (ref 8–23)
CO2: 14 mmol/L — ABNORMAL LOW (ref 22–32)
Calcium: 8.5 mg/dL — ABNORMAL LOW (ref 8.9–10.3)
Chloride: 105 mmol/L (ref 98–111)
Creatinine, Ser: 0.32 mg/dL — ABNORMAL LOW (ref 0.61–1.24)
GFR, Estimated: >60 mL/min (ref >60)
Glucose, Bld: 89 mg/dL (ref 70–99)
Potassium: 3.5 mmol/L (ref 3.5–5.1)
Sodium: 136 mmol/L (ref 135–145)
Total Bilirubin: 1 mg/dL (ref 0.3–1.2)
Total Protein: 5.1 g/dL — ABNORMAL LOW (ref 6.5–8.1)

## 2020-12-11 LAB — CBC
HCT: 38.8 % — ABNORMAL LOW (ref 39.0–52.0)
Hemoglobin: 12.9 g/dL — ABNORMAL LOW (ref 13.0–17.0)
MCH: 35.1 pg — ABNORMAL HIGH (ref 26.0–34.0)
MCHC: 33.2 g/dL (ref 30.0–36.0)
MCV: 105.4 fL — ABNORMAL HIGH (ref 80.0–100.0)
Platelets: 133 10*3/uL — ABNORMAL LOW (ref 150–400)
RBC: 3.68 MIL/uL — ABNORMAL LOW (ref 4.22–5.81)
RDW: 17.8 % — ABNORMAL HIGH (ref 11.5–15.5)
WBC: 5.8 10*3/uL (ref 4.0–10.5)
nRBC: 0 % (ref 0.0–0.2)

## 2020-12-11 MED ORDER — DIPHENHYDRAMINE HCL 25 MG PO CAPS
25.0000 mg | ORAL_CAPSULE | Freq: Once | ORAL | Status: AC
  Administered 2020-12-11: 25 mg via ORAL

## 2020-12-11 MED ORDER — ACETAMINOPHEN 325 MG PO TABS
650.0000 mg | ORAL_TABLET | Freq: Once | ORAL | Status: AC
  Administered 2020-12-11: 650 mg via ORAL

## 2020-12-11 MED ORDER — SODIUM CHLORIDE 0.9 % IV SOLN
375.0000 mg/m2 | Freq: Once | INTRAVENOUS | Status: AC
  Administered 2020-12-11: 600 mg via INTRAVENOUS
  Filled 2020-12-11: qty 10

## 2020-12-11 MED ORDER — VINCRISTINE SULFATE CHEMO INJECTION 1 MG/ML
1.0000 mg | Freq: Once | INTRAVENOUS | Status: AC
  Administered 2020-12-11: 1 mg via INTRAVENOUS
  Filled 2020-12-11: qty 1

## 2020-12-11 MED ORDER — DEXAMETHASONE SODIUM PHOSPHATE 100 MG/10ML IJ SOLN
10.0000 mg | Freq: Once | INTRAMUSCULAR | Status: AC
  Administered 2020-12-11: 10 mg via INTRAVENOUS
  Filled 2020-12-11: qty 1

## 2020-12-11 MED ORDER — SODIUM CHLORIDE 0.9 % IV SOLN: Freq: Once | INTRAVENOUS | Status: DC

## 2020-12-11 MED ORDER — PREDNISONE 20 MG PO TABS
60.0000 mg | ORAL_TABLET | Freq: Every day | ORAL | Status: AC
Start: 2020-12-12 — End: 2020-12-15
  Administered 2020-12-12 – 2020-12-15 (×4): 60 mg via ORAL
  Filled 2020-12-11 (×4): qty 3

## 2020-12-11 MED ORDER — SODIUM CHLORIDE 0.9 % IV SOLN
600.0000 mg/m2 | Freq: Once | INTRAVENOUS | Status: AC
  Administered 2020-12-11: 1020 mg via INTRAVENOUS
  Filled 2020-12-11: qty 51

## 2020-12-11 MED ORDER — PALONOSETRON HCL INJECTION 0.25 MG/5ML
0.2500 mg | Freq: Once | INTRAVENOUS | Status: AC
  Administered 2020-12-11: 0.25 mg via INTRAVENOUS
  Filled 2020-12-11: qty 5

## 2020-12-11 NOTE — Progress Notes (Signed)
Patient has received vincristine, cytoxan, and rituximab on my shift. Patient blood return was checked before and after each chemotherapy adminstration. Patient VS remained WNL thus far.

## 2020-12-11 NOTE — Progress Notes (Signed)
PROGRESS NOTE    Cody Hurley  VQQ:[id number] DOB: 09/03/1925 DOA: 12/01/2020 PCP: Cody Sos, MD   Brief Narrative: HPI per Dr. Doylene Hurley is a 87 y.o. male with history of rectal cancer last treated on 2017-experiencing increasing weakness and poor appetite.  Patient was diagnosed with COVID infection about 2 weeks ago was treated with antiviral and p.o. prednisone.  Subsequent which patient also had come to the ER on June 26 with shortness of breath and at the time patient CT angiogram showed 2 small pulmonary embolism and was placed on apixaban.  The CT scan also showed retroperitoneal lesions concerning for metastatic cancer.  Patient had subsequently followed with Dr. Benay Hurley patient's oncologist who ordered a CT abdomen pelvis confirms multiple lesions and since patient has been progressively worsening and finding it difficult to ambulate has poor appetite lost at least 20 to 30 pounds in the last month.  Patient admitted for further work-up.  On exam patient appears generally weak otherwise nonfocal.  Has mild swelling of the legs.  Assessment & Plan:   Principal Problem:   Retroperitoneal mass Active Problems:   Pulmonary embolism (HCC)   FTT (failure to thrive) in adult   Hypercalcemia   History of rectal cancer   History of prostate cancer   COVID-19 virus infection: Recent COVID-19 infection status posttreatment   Acidosis   Lactic acidosis   Non-Hodgkin lymphoma (HCC)    #1 retroperitoneal renal mass concerning for metastatic cancer-status postbiopsy consistent with aggressive B-cell lymphoma.  Patient wanting to have chemotherapy started today. His caregiver was in the room she told me that the patient thought about it overnight and decided to start chemo.  His wife was not in the room.  #2 recent PE on Eliquis.  #3 history of rectal cancer and prostate cancer followed by Dr. Benay Hurley.  #4 hypercalcemia likely secondary to malignancy however will DC  multivitamins.  #5 recent COVID 19 he received treatment with Decadron and remdesivir.  Currently he is on room air.  #6 lactic acidosis likely secondary to malignancy.  ID was consulted.  Antibiotics stopped.  MRSA PCR negative.  CT abdomen and pelvis no acute abnormalities.  Chest x-ray showed interval worsening right midlung and suprahilar pulmonary opacities since the prior study. Anion gap 15 and trending down  #7 hypoglycemia-CBG (last 3)  Recent Labs    12/10/20 2053 12/11/20 0735 12/11/20 1213  GLUCAP 79 70 98      Nutrition Problem: Inadequate oral intake Etiology: acute illness Signs/Symptoms: per patient/family report    Interventions: Ensure Enlive (each supplement provides 350kcal and 20 grams of protein), MVI, Magic cup  Estimated body mass index is 21.21 kg/m as calculated from the following:   Height as of this encounter: 5\' 7"  (1.702 m).   Weight as of this encounter: 61.4 kg.  DVT prophylaxis: Eliquis  code Status: DNR Family Communication: Wife at bedside disposition Plan:  Status is: Inpatient  Remains inpatient appropriate because:IV treatments appropriate due to intensity of illness or inability to take PO  Dispo: The patient is from: Home              Anticipated d/c is to: Home              Patient currently is not medically stable to d/c.   Difficult to place patient No   Consultants:  Oncology palliative care interventional radiology infectious disease  Procedures: CT-guided needle biopsy of the right retroperitoneal mass 12/03/2020  Antimicrobials:  None  Subjective: Resting in bed appears stronger than yesterday very clear about wanting to start chemo today.  He reports having a good night Objective: Vitals:   12/10/20 0505 12/10/20 1730 12/10/20 2051 12/11/20 0452  BP: 128/70 120/64 105/70 112/64  Pulse: 89 93 95 92  Resp: 20 20 16 16   Temp: 98 F (36.7 C) 98.1 F (36.7 C) 98 F (36.7 C) 97.8 F (36.6 C)  TempSrc: Oral Oral Oral  Oral  SpO2: 92% 95% 93% 94%  Weight:      Height:        Intake/Output Summary (Last 24 hours) at 12/11/2020 1302 Last data filed at 12/11/2020 0526 Gross per 24 hour  Intake 120 ml  Output 1100 ml  Net -980 ml    Filed Weights   12/04/20 0406  Weight: 61.4 kg    Examination:  General exam: Appears weaker Respiratory system: Scattered rhonchi o auscultation. Respiratory effort normal. Cardiovascular system: S1 & S2 heard, RRR. No JVD, murmurs, rubs, gallops or clicks. No pedal edema. Gastrointestinal system: Abdomen is nondistended, soft and nontender. No organomegaly or masses felt. Normal bowel sounds heard. Central nervous system: Alert and oriented. No focal neurological deficits. Extremities: 1+ bilateral pitting edema  skin: No rashes, lesions or ulcers Psychiatry: Judgement and insight appear normal. Mood & affect appropriate.     Data Reviewed: I have personally reviewed following labs and imaging studies  CBC: Recent Labs  Lab 12/07/20 0459 12/08/20 0501 12/08/20 0508 12/09/20 0516 12/10/20 0527 12/11/20 1015  WBC 5.7  --  6.0 5.5 6.1 5.8  NEUTROABS  --  4.0  --  3.8  --   --   HGB 13.1  --  13.1 13.6 13.6 12.9*  HCT 39.2  --  40.2 41.2 41.6 38.8*  MCV 102.1*  --  104.4* 105.1* 103.7* 105.4*  PLT 185  --  153 165 159 133*    Basic Metabolic Panel: Recent Labs  Lab 12/06/20 0558 12/07/20 0459 12/08/20 0508 12/09/20 0516 12/10/20 0527 12/11/20 1015  NA 136 135 137 137 134* 136  K 3.3* 3.3* 3.3* 4.2 4.0 3.5  CL 100 100 102 103 100 105  CO2 17* 16* 14* 13* 19* 14*  GLUCOSE 96 70 74 61* 65* 89  BUN 13 11 9 11 13 12   CREATININE 0.66 0.54* 0.62  0.50* 0.56* 0.53* 0.32*  CALCIUM 9.8 9.3 9.5 10.1 9.9 8.5*  MG 2.0 1.9 1.9 2.0  --   --   PHOS 2.4* 2.4* 2.4* 2.6  --   --     GFR: Estimated Creatinine Clearance: 50.1 mL/min (A) (by C-G formula based on SCr of 0.32 mg/dL (L)). Liver Function Tests: Recent Labs  Lab 12/07/20 0459 12/08/20 0508  12/09/20 0516 12/10/20 0527 12/11/20 1015  AST 65*  --   --  63* 57*  ALT 33  --   --  33 30  ALKPHOS 69  --   --  71 56  BILITOT 0.7  --   --  1.1 1.0  PROT 5.6*  --   --  6.1* 5.1*  ALBUMIN 2.4* 2.6* 2.5* 2.7* 2.3*    No results for input(s): LIPASE, AMYLASE in the last 168 hours. No results for input(s): AMMONIA in the last 168 hours. Coagulation Profile: No results for input(s): INR, PROTIME in the last 168 hours. Cardiac Enzymes: No results for input(s): CKTOTAL, CKMB, CKMBINDEX, TROPONINI in the last 168 hours. BNP (last 3 results) No results for input(s): PROBNP  in the last 8760 hours. HbA1C: No results for input(s): HGBA1C in the last 72 hours. CBG: Recent Labs  Lab 12/10/20 1146 12/10/20 1727 12/10/20 2053 12/11/20 0735 12/11/20 1213  GLUCAP 70 74 79 70 98    Lipid Profile: No results for input(s): CHOL, HDL, LDLCALC, TRIG, CHOLHDL, LDLDIRECT in the last 72 hours. Thyroid Function Tests: No results for input(s): TSH, T4TOTAL, FREET4, T3FREE, THYROIDAB in the last 72 hours. Anemia Panel: No results for input(s): VITAMINB12, FOLATE, FERRITIN, TIBC, IRON, RETICCTPCT in the last 72 hours. Sepsis Labs: Recent Labs  Lab 12/06/20 1029 12/06/20 1342 12/06/20 1615 12/07/20 0459 12/08/20 0508  PROCALCITON  --   --   --  <0.10 <0.10  LATICACIDVEN >11.0* >11.0* >11.0* >11.0*  --      Recent Results (from the past 240 hour(s))  Culture, Urine     Status: None   Collection Time: 12/03/20  7:00 AM   Specimen: Urine, Catheterized  Result Value Ref Range Status   Specimen Description   Final    URINE, CATHETERIZED Performed at [facility], [place] [address], [place], [place] [address]    Special Requests   Final    NONE Performed at [facility], [place] [address], [place], [place] [address]    Culture   Final    NO GROWTH Performed at [facility], [place] [address], [place], [place] [address]    Report Status  12/05/2020 FINAL  Final  Culture, blood (Routine X 2) w Reflex to ID Panel     Status: None   Collection Time: 12/06/20  1:42 PM   Specimen: BLOOD  Result Value Ref Range Status   Specimen Description   Final    BLOOD RIGHT ANTECUBITAL Performed at [place] [address] [place], [place] [address]    Special Requests   Final    BOTTLES DRAWN AEROBIC ONLY Blood Culture adequate volume Performed at [place] [address], [place], [place] [address]    Culture   Final    NO GROWTH 5 DAYS Performed at [facility], [place] [address], [place], [place] [address]    Report Status 12/11/2020 FINAL  Final  Culture, blood (Routine X 2) w Reflex to ID Panel     Status: None   Collection Time: 12/06/20  4:11 PM   Specimen: BLOOD  Result Value Ref Range Status   Specimen Description   Final    BLOOD RIGHT ANTECUBITAL Performed at [place] [address], [place], [place] [address]    Special Requests   Final    BOTTLES DRAWN AEROBIC AND ANAEROBIC Blood Culture results may not be optimal due to an inadequate volume of blood received in culture bottles Performed at [place] [address] [place], [place] [address]    Culture   Final    NO GROWTH 5 DAYS Performed at [facility], [place] [address], [place],  [address]    Report Status 12/11/2020 FINAL  Final  Culture, Urine     Status: None   Collection Time: 12/07/20 10:17 AM   Specimen: Urine, Clean Catch  Result Value Ref Range Status   Specimen Description   Final    URINE, CLEAN CATCH Performed at [facility], [place] [address] [place],  [address]    Special Requests   Final    NONE Performed at [facility], [address] [place], [place] [address]    Culture   Final    NO GROWTH Performed at [facility], [place] [address] [place], [place] [address]    Report  Status 12/08/2020 FINAL  Final  MRSA Next Gen by PCR, Nasal     Status: None   Collection Time: 12/07/20 11:00 AM   Specimen: Nasal Mucosa; Nasal Swab  Result Value Ref Range Status   MRSA by PCR Next Gen NOT DETECTED NOT DETECTED Final    Comment: (NOTE) The GeneXpert MRSA Assay (FDA approved for NASAL specimens only), is one component of a comprehensive MRSA colonization surveillance program. It is not intended to diagnose MRSA infection nor to guide or monitor treatment for MRSA infections. Test performance is not FDA approved in patients less than 63 years old. Performed at [facility], [place] [address] [place], [place] [address]           Radiology Studies: No results found.      Scheduled Meds:  acetaminophen  650 mg Oral Once   acetaminophen  650 mg Oral Once   allopurinol  300 mg Oral Daily   apixaban  5 mg Oral BID   Chlorhexidine Gluconate Cloth  6 each Topical Daily   cyclophosphamide  600 mg/m2 (Treatment Plan Recorded) Intravenous Once   diphenhydrAMINE  25 mg Oral Once   diphenhydrAMINE  25 mg Oral Once   feeding supplement  237 mL Oral BID BM   food thickener  1 packet Oral TID WC, HS, 0200   palonosetron  0.25 mg Intravenous Once   palonosetron  0.25 mg Intravenous Once   predniSONE  20 mg Oral Q breakfast   riTUXimab-pvvr (RUXIENCE) IV infusion  375 mg/m2 (Treatment Plan Recorded) Intravenous Once   sodium bicarbonate  1,300 mg Oral TID   vinCRIStine (ONCOVIN) CHEMO IV infusion  1 mg Intravenous Once   Continuous Infusions:  sodium chloride 75 mL/hr at 12/11/20 1144   sodium chloride     dexamethasone (DECADRON) IVPB ([facility])     dexamethasone (DECADRON) IVPB ([facility])       LOS: 10 days    Time spent: 40 min    Georgette Shell, MD  12/11/2020, 1:02 PM

## 2020-12-11 NOTE — Progress Notes (Addendum)
IP PROGRESS NOTE  Subjective:   Cody Hurley reports an improved appetite this morning.  He has no other specific complaints today.  The patient states that he has been thinking more about chemotherapy and now wants to proceed with CVP-Rituxan.  Objective: Vital signs in last 24 hours: Blood pressure 112/64, pulse 92, temperature 97.8 F (36.6 C), temperature source Oral, resp. rate 16, height 5' 7"  (1.702 m), weight 61.4 kg, SpO2 94 %.  Intake/Output from previous day: 07/14 0701 - 07/15 0700 In: 240 [P.O.:240] Out: 1600 [Urine:1600]  Physical Exam:  HEENT: Dryness of the tongue and buccal mucosa, no thrush Lungs: Inspiratory rhonchi at the lower posterior chest bilaterally, no respiratory distress Cardiac: Regular rate and rhythm Abdomen: No hepatosplenomegaly, no mass, nontender, left lower quadrant colostomy Ractal: No excoriation or masses Extremities: Trace pitting edema to lower leg bilaterally Neurologic: Alert, follows commands, oriented   Lab Results: Recent Labs    12/10/20 0527 12/11/20 1015  WBC 6.1 5.8  HGB 13.6 12.9*  HCT 41.6 38.8*  PLT 159 133*    BMET Recent Labs    12/09/20 0516 12/10/20 0527  NA 137 134*  K 4.2 4.0  CL 103 100  CO2 13* 19*  GLUCOSE 61* 65*  BUN 11 13  CREATININE 0.56* 0.53*  CALCIUM 10.1 9.9  Ionized calcium on 12/03/2020: 5.1  Lab Results  Component Value Date   CEA1 2.57 11/18/2020    Studies/Results: No results found.  Medications: I have reviewed the patient's current medications.  Assessment/Plan: Stage III rectal cancer,ypT4a,N2 status post a low anterior resection in September 2017 Presenting November 2016 with a mass at 10 cm from the anus, clinical stage II by EUS-T3N0 Neoadjuvant Xeloda and radiation beginning 06/01/2015, completed 20 of 28 planned fractions, discontinued secondary to toxicity Elevated pretreatment CEA CT chest 04/10/2019-no evidence of metastatic disease, changes of interstitial lung disease,  changes suggestive of cirrhosis CT chest 11/22/2020-scattered dependent groundglass airspace opacity and irregular interstitial opacity-nonspecific infectious or inflammatory, new bilateral adrenal nodules, extensive ill-defined soft tissue surrounding the right kidney suspicious for malignancy CT abdomen/pelvis 11/26/2020-bilateral adrenal masses, multinodular masslike soft tissue density throughout the right perinephric space and renal sinus, new left periaortic and retroperitoneal adenopathy, cirrhosis   Prostate cancer 1990 treated with a prostatectomy History of a TIA Carpal tunnel syndrome Palpable liver edge-ultrasound abdomen 04/10/2019-fatty infiltration of the liver, normal portal vein flow Left lung subsegmental pulmonary emboli 11/22/2020-apixaban Anorexia/weight loss COVID-19 infection June 2022 Admission 12/01/2020 with failure to thrive Elevated calcium-normal ionized calcium 12/03/2020 Retroperitoneal biopsy from 12/03/2020 consistent with large B-cell lymphoma CD20 positive, KI-67 95%, FISH panel pending Cycle 1 CVP-rituximab 12/10/2020    Cody Hurley appears unchanged.  He has been diagnosed with large B-cell lymphoma.  He appears to have a high-grade lymphoma.  This accounts for his generalized weakness and anorexia over the past several months.  He has a high chance of clinical improvement with systemic therapy and we recommend a trial of systemic chemotherapy.  Recommend CVP-rituximab.  We will modify chemotherapy regimen based on his age and performance status.  Chemotherapy was planned for 7/14 and the patient initially agreed to proceed.  However, he changed his mind.  Chemotherapy was held. This morning, the patient reports that he has been thinking about chemotherapy and would now like to proceed.  He tells me this morning that he felt decisions were being made for him but he would like to make the decision for himself to proceed with chemotherapy.  Potential  toxicities associated  with his chemotherapy regimen have been discussed including but not limited to the chance of nausea/vomiting, mucositis, alopecia, tumor lysis syndrome, hematologic toxicity, infection, and bleeding.  We also discussed the potential for allergic reaction, infections, and CNS toxicity associated with rituximab.  He agrees to proceed.  I contacted the patient's wife by telephone and updated her regarding the patient's decision to proceed with chemotherapy.  Continue normal saline at 75 cc/h to reduce the risk of tumor lysis syndrome.  I have reordered an echocardiogram in anticipation of adding doxorubicin as part of "mini CHOP" with cycle #2. He will plan to be discharged to a rehabilitation facility following chemotherapy with the hope to return home in a few weeks.  We can administer cycle 2 as an outpatient.  Recommendations: 1.  Continue normal saline at 75 cc/h. 2.  Continue allopurinol. 3.  He will proceed with cycle #1 CVP-rituximab today.  Nursing has been updated. 4.  Leave PICC in place at discharge   LOS: 10 days   Cody Bussing, Cody Hurley   12/11/2020, 10:50 AM Cody Hurley was interviewed and examined.  He confirmed his decision to proceed with chemotherapy when I saw him this morning.  We discussed the expected prognosis in the absence of chemotherapy and with treatment.  The plan is to proceed with cycle 1 CVP-rituximab today.  He will be monitored for acute toxicities including tumor lysis syndrome. He will most likely be discharged to a skilled nursing facility.  Cycle 2 chemotherapy will be given as an outpatient.  I was present for greater than 50% of today's visit.  I performed medical decision making.  [name], MD

## 2020-12-11 NOTE — Progress Notes (Signed)
PT Cancellation Note  Patient Details Name: [name] MRN: [id number] DOB: January 04, 1926   Cancelled Treatment:    Reason Eval/Treat Not Completed: Patient at procedure or test/unavailable (Per RN, pt receiving oncology treatment at this time. Will follow up as schedule allows.)   Festus Barren., PT, DPT  Acute Rehabilitation Services  Office [phone]  12/11/2020, 3:13 PM

## 2020-12-11 NOTE — Progress Notes (Signed)
  Echocardiogram 2D Echocardiogram has been performed.  Cody Hurley 12/11/2020, 4:11 PM

## 2020-12-11 NOTE — Progress Notes (Signed)
Occupational Therapy Treatment Patient Details Name: Cody Hurley MRN: [id number] DOB: April 08, 1926 Today's Date: 12/11/2020    History of present illness 87 yo male admitted with retroperitoneal mass, weakness, FTT. Hx of stage III rectal ca, LAR 2017, prostate ca, PE, anorexia, recent COVID   OT comments  Patient reports wanting to get stronger and fight. Treatment focused on functional mobility and activity tolerance. Reports he hasn't been out of the bed much. Min assist for supine to sit and standing. Min guard to ambulate with RW around the bed slowly. Seated in recliner. Educated patient to perform ankle pumps due to LE edema. Encouraged movement and fidgeting, up to chair at the very least with all three meals. Patient and family friend verbalized understanding.    Follow Up Recommendations  Home health OT;Supervision/Assistance - 24 hour    Equipment Recommendations  None recommended by OT    Recommendations for Other Services      Precautions / Restrictions Precautions Precautions: Fall Precaution Comments: colostomy, nectar thick liquids Restrictions Weight Bearing Restrictions: No       Mobility Bed Mobility Overal bed mobility: Needs Assistance       Supine to sit: Min assist     General bed mobility comments: Min assist for hand hold to transfer into sitting at edge of bed with increased time.    Transfers Overall transfer level: Needs assistance Equipment used: Rolling walker (2 wheeled) Transfers: Sit to/from [facility] Sit to Stand: Min assist;From elevated surface Stand pivot transfers: Min guard       General transfer comment: Min assist for standing and min guard to ambulate around bed to recliner with RW. Gait speed slow.    Balance Overall balance assessment: Needs assistance Sitting-balance support: No upper extremity supported Sitting balance-Leahy Scale: Fair     Standing balance support: During functional  activity Standing balance-Leahy Scale: Poor Standing balance comment: reliant on external support from RW                           ADL either performed or assessed with clinical judgement   ADL                                               Vision   Vision Assessment?: No apparent visual deficits   Perception     Praxis      Cognition Arousal/Alertness: Awake/alert Behavior During Therapy: WFL for tasks assessed/performed Overall Cognitive Status: Within Functional Limits for tasks assessed                                 General Comments: very HOH with hearing aids in place, hard to understand due to poor pronunciation (dry mouth vs dysarthria?)        Exercises     Shoulder Instructions       General Comments      Pertinent Vitals/ Pain       Pain Assessment: No/denies pain  Home Living                                          Prior Functioning/Environment  Frequency  Min 2X/week        Progress Toward Goals  OT Goals(current goals can now be found in the care plan section)  Progress towards OT goals: Progressing toward goals  Acute Rehab OT Goals Patient Stated Goal: get stronger and return home  Plan Discharge plan remains appropriate    Co-evaluation                 AM-PAC OT "6 Clicks" Daily Activity     Outcome Measure   Help from another person eating meals?: A Little Help from another person taking care of personal grooming?: A Little Help from another person toileting, which includes using toliet, bedpan, or urinal?: A Little Help from another person bathing (including washing, rinsing, drying)?: A Lot Help from another person to put on and taking off regular upper body clothing?: A Little Help from another person to put on and taking off regular lower body clothing?: A Lot 6 Click Score: 16    End of Session Equipment Utilized During Treatment:  Gait belt;Rolling walker  OT Visit Diagnosis: Unsteadiness on feet (R26.81);Muscle weakness (generalized) (M62.81)   Activity Tolerance Patient tolerated treatment well   Patient Left in chair;with call bell/phone within reach;with family/visitor present   Nurse Communication Mobility status        Time: 2458-0998 OT Time Calculation (min): 18 min  Charges: OT General Charges $OT Visit: 1 Visit OT Treatments $Self Care/Home Management : 8-22 mins  Derl Barrow, OTR/L [place]  Office [phone] Pager: [place] 12/11/2020, 9:47 AM

## 2020-12-12 ENCOUNTER — Encounter: Payer: Self-pay | Admitting: Oncology

## 2020-12-12 DIAGNOSIS — R1900 Intra-abdominal and pelvic swelling, mass and lump, unspecified site: Secondary | ICD-10-CM | POA: Diagnosis not present

## 2020-12-12 LAB — COMPREHENSIVE METABOLIC PANEL
ALT: 32 U/L (ref 0–44)
AST: 62 U/L — ABNORMAL HIGH (ref 15–41)
Albumin: 2.6 g/dL — ABNORMAL LOW (ref 3.5–5.0)
Alkaline Phosphatase: 66 U/L (ref 38–126)
Anion gap: 15 (ref 5–15)
BUN: 16 mg/dL (ref 8–23)
CO2: 20 mmol/L — ABNORMAL LOW (ref 22–32)
Calcium: 9.5 mg/dL (ref 8.9–10.3)
Chloride: 100 mmol/L (ref 98–111)
Creatinine, Ser: 0.49 mg/dL — ABNORMAL LOW (ref 0.61–1.24)
GFR, Estimated: >60 mL/min (ref >60)
Glucose, Bld: 154 mg/dL — ABNORMAL HIGH (ref 70–99)
Potassium: 4.4 mmol/L (ref 3.5–5.1)
Sodium: 135 mmol/L (ref 135–145)
Total Bilirubin: 0.8 mg/dL (ref 0.3–1.2)
Total Protein: 5.7 g/dL — ABNORMAL LOW (ref 6.5–8.1)

## 2020-12-12 LAB — CBC WITH DIFFERENTIAL/PLATELET
Abs Immature Granulocytes: 0.07 10*3/uL (ref 0.00–0.07)
Basophils Absolute: 0 10*3/uL (ref 0.0–0.1)
Basophils Relative: 0 %
Eosinophils Absolute: 0 10*3/uL (ref 0.0–0.5)
Eosinophils Relative: 0 %
HCT: 41.8 % (ref 39.0–52.0)
Hemoglobin: 13.9 g/dL (ref 13.0–17.0)
Immature Granulocytes: 1 %
Lymphocytes Relative: 2 %
Lymphs Abs: 0.2 10*3/uL — ABNORMAL LOW (ref 0.7–4.0)
MCH: 34.8 pg — ABNORMAL HIGH (ref 26.0–34.0)
MCHC: 33.3 g/dL (ref 30.0–36.0)
MCV: 104.8 fL — ABNORMAL HIGH (ref 80.0–100.0)
Monocytes Absolute: 0.3 10*3/uL (ref 0.1–1.0)
Monocytes Relative: 4 %
Neutro Abs: 6.8 10*3/uL (ref 1.7–7.7)
Neutrophils Relative %: 93 %
Platelets: 122 10*3/uL — ABNORMAL LOW (ref 150–400)
RBC: 3.99 MIL/uL — ABNORMAL LOW (ref 4.22–5.81)
RDW: 17.5 % — ABNORMAL HIGH (ref 11.5–15.5)
WBC: 7.4 10*3/uL (ref 4.0–10.5)
nRBC: 0 % (ref 0.0–0.2)

## 2020-12-12 LAB — GLUCOSE, CAPILLARY
Glucose-Capillary: 173 mg/dL — ABNORMAL HIGH (ref 70–99)
Glucose-Capillary: 216 mg/dL — ABNORMAL HIGH (ref 70–99)
Glucose-Capillary: 265 mg/dL — ABNORMAL HIGH (ref 70–99)
Glucose-Capillary: 234 mg/dL — ABNORMAL HIGH (ref 70–99)

## 2020-12-12 NOTE — Progress Notes (Signed)
PROGRESS NOTE    Cody Hurley  DQQ:[id number] DOB: 12/21/1925 DOA: 12/01/2020 PCP: [name], MD   Brief Narrative: HPI per [name] is a 87 y.o. male with history of rectal cancer last treated on 2017-experiencing increasing weakness and poor appetite.  Patient was diagnosed with COVID infection about 2 weeks ago was treated with antiviral and p.o. prednisone.  Subsequent which patient also had come to the ER on June 26 with shortness of breath and at the time patient CT angiogram showed 2 small pulmonary embolism and was placed on apixaban.  The CT scan also showed retroperitoneal lesions concerning for metastatic cancer.  Patient had subsequently followed with [name] patient's oncologist who ordered a CT abdomen pelvis confirms multiple lesions and since patient has been progressively worsening and finding it difficult to ambulate has poor appetite lost at least 20 to 30 pounds in the last month.  Patient admitted for further work-up.  On exam patient appears generally weak otherwise nonfocal.  Has mild swelling of the legs.  Assessment & Plan:   Principal Problem:   Retroperitoneal mass Active Problems:   Pulmonary embolism (HCC)   FTT (failure to thrive) in adult   Hypercalcemia   History of rectal cancer   History of prostate cancer   COVID-19 virus infection: Recent COVID-19 infection status posttreatment   Acidosis   Lactic acidosis   Non-Hodgkin lymphoma (HCC)    #1 retroperitoneal renal mass concerning for metastatic cancer-status postbiopsy consistent with aggressive B-cell lymphoma.  Patient wanting to have chemotherapy started today. His caregiver was in the room she told me that the patient thought about it overnight and decided to start chemo.  His wife was not in the room.  #2 recent PE on Eliquis.  #3 history of rectal cancer and prostate cancer followed by [name].  #4 hypercalcemia likely secondary to malignancy however will DC  multivitamins.  #5 recent COVID 19 he received treatment with Decadron and remdesivir.  Currently he is on room air.  #6 lactic acidosis likely secondary to malignancy.  ID was consulted.  Antibiotics stopped.  MRSA PCR negative.  CT abdomen and pelvis no acute abnormalities.  Chest x-ray showed interval worsening right midlung and suprahilar pulmonary opacities since the prior study. Anion gap 15 and trending down Dc bicarb  #7 hypoglycemia-CBG (last 3)  Recent Labs    12/11/20 2111 12/12/20 0734 12/12/20 1146  GLUCAP 128* 173* 216*      Nutrition Problem: Inadequate oral intake Etiology: acute illness Signs/Symptoms: per patient/family report    Interventions: Ensure Enlive (each supplement provides 350kcal and 20 grams of protein), MVI, Magic cup  Estimated body mass index is 21.21 kg/m as calculated from the following:   Height as of this encounter: 5\' 7"  (1.702 m).   Weight as of this encounter: 61.4 kg.  DVT prophylaxis: Eliquis  code Status: DNR Family Communication: Wife at bedside  disposition Plan:  Status is: Inpatient  Remains inpatient appropriate because:IV treatments appropriate due to intensity of illness or inability to take PO  Dispo: The patient is from: Home              Anticipated d/c is to: Home              Patient currently is not medically stable to d/c.   Difficult to place patient No   Consultants:  Oncology palliative care interventional radiology infectious disease  Procedures: CT-guided needle biopsy of the right retroperitoneal mass  12/03/2020  Antimicrobials: None  Subjective:feels ok slept well  Objective: Vitals:   12/11/20 1833 12/11/20 1903 12/11/20 2107 12/12/20 0438  BP: 115/66 116/60 109/63 109/66  Pulse: 93 94 99 93  Resp:   20 18  Temp: 97.8 F (36.6 C)  (!) 97.3 F (36.3 C) (!) 97.2 F (36.2 C)  TempSrc: Oral  Oral Oral  SpO2: 95% 94% 93% 93%  Weight:      Height:        Intake/Output Summary (Last 24 hours)  at 12/12/2020 1359 Last data filed at 12/12/2020 0455 Gross per 24 hour  Intake 2711.91 ml  Output 700 ml  Net 2011.91 ml    Filed Weights   12/04/20 0406  Weight: 61.4 kg    Examination:  General exam: Appears weaker Respiratory system: Scattered rhonchi o auscultation. Respiratory effort normal. Cardiovascular system: S1 & S2 heard, RRR. No JVD, murmurs, rubs, gallops or clicks. No pedal edema. Gastrointestinal system: Abdomen is nondistended, soft and nontender. No organomegaly or masses felt. Normal bowel sounds heard. Central nervous system: Alert and oriented. No focal neurological deficits. Extremities: 1+ bilateral pitting edema  skin: No rashes, lesions or ulcers Psychiatry: Judgement and insight appear normal. Mood & affect appropriate.     Data Reviewed: I have personally reviewed following labs and imaging studies  CBC: Recent Labs  Lab 12/08/20 0501 12/08/20 0508 12/09/20 0516 12/10/20 0527 12/11/20 1015 12/12/20 0541  WBC  --  6.0 5.5 6.1 5.8 7.4  NEUTROABS 4.0  --  3.8  --   --  6.8  HGB  --  13.1 13.6 13.6 12.9* 13.9  HCT  --  40.2 41.2 41.6 38.8* 41.8  MCV  --  104.4* 105.1* 103.7* 105.4* 104.8*  PLT  --  153 165 159 133* 122*    Basic Metabolic Panel: Recent Labs  Lab 12/06/20 0558 12/07/20 0459 12/08/20 0508 12/09/20 0516 12/10/20 0527 12/11/20 1015 12/12/20 0541  NA 136 135 137 137 134* 136 135  K 3.3* 3.3* 3.3* 4.2 4.0 3.5 4.4  CL 100 100 102 103 100 105 100  CO2 17* 16* 14* 13* 19* 14* 20*  GLUCOSE 96 70 74 61* 65* 89 154*  BUN 13 11 9 11 13 12 16   CREATININE 0.66 0.54* 0.62  0.50* 0.56* 0.53* 0.32* 0.49*  CALCIUM 9.8 9.3 9.5 10.1 9.9 8.5* 9.5  MG 2.0 1.9 1.9 2.0  --   --   --   PHOS 2.4* 2.4* 2.4* 2.6  --   --   --     GFR: Estimated Creatinine Clearance: 50.1 mL/min (A) (by C-G formula based on SCr of 0.49 mg/dL (L)). Liver Function Tests: Recent Labs  Lab 12/07/20 0459 12/08/20 0508 12/09/20 0516 12/10/20 0527  12/11/20 1015 12/12/20 0541  AST 65*  --   --  63* 57* 62*  ALT 33  --   --  33 30 32  ALKPHOS 69  --   --  71 56 66  BILITOT 0.7  --   --  1.1 1.0 0.8  PROT 5.6*  --   --  6.1* 5.1* 5.7*  ALBUMIN 2.4* 2.6* 2.5* 2.7* 2.3* 2.6*    No results for input(s): LIPASE, AMYLASE in the last 168 hours. No results for input(s): AMMONIA in the last 168 hours. Coagulation Profile: No results for input(s): INR, PROTIME in the last 168 hours. Cardiac Enzymes: No results for input(s): CKTOTAL, CKMB, CKMBINDEX, TROPONINI in the last 168 hours. BNP (last 3  results) No results for input(s): PROBNP in the last 8760 hours. HbA1C: No results for input(s): HGBA1C in the last 72 hours. CBG: Recent Labs  Lab 12/11/20 1213 12/11/20 1716 12/11/20 2111 12/12/20 0734 12/12/20 1146  GLUCAP 98 121* 128* 173* 216*    Lipid Profile: No results for input(s): CHOL, HDL, LDLCALC, TRIG, CHOLHDL, LDLDIRECT in the last 72 hours. Thyroid Function Tests: No results for input(s): TSH, T4TOTAL, FREET4, T3FREE, THYROIDAB in the last 72 hours. Anemia Panel: No results for input(s): VITAMINB12, FOLATE, FERRITIN, TIBC, IRON, RETICCTPCT in the last 72 hours. Sepsis Labs: Recent Labs  Lab 12/06/20 1029 12/06/20 1342 12/06/20 1615 12/07/20 0459 12/08/20 0508  PROCALCITON  --   --   --  <0.10 <0.10  LATICACIDVEN >11.0* >11.0* >11.0* >11.0*  --      Recent Results (from the past 240 hour(s))  Culture, Urine     Status: None   Collection Time: 12/03/20  7:00 AM   Specimen: Urine, Catheterized  Result Value Ref Range Status   Specimen Description   Final    URINE, CATHETERIZED Performed at [facility], [place] [address], [place], [place] [address]    Special Requests   Final    NONE Performed at [facility], [place] [address] [place], [place] [address]    Culture   Final    NO GROWTH Performed at [facility], [place] [address] [place], [place] [address]    Report  Status 12/05/2020 FINAL  Final  Culture, blood (Routine X 2) w Reflex to ID Panel     Status: None   Collection Time: 12/06/20  1:42 PM   Specimen: BLOOD  Result Value Ref Range Status   Specimen Description   Final    BLOOD RIGHT ANTECUBITAL Performed at [place] [address], [place], [place] [address]    Special Requests   Final    BOTTLES DRAWN AEROBIC ONLY Blood Culture adequate volume Performed at [place] [address], [place], [place] [address]    Culture   Final    NO GROWTH 5 DAYS Performed at [facility], [place] [address] [place], [place] [address]    Report Status 12/11/2020 FINAL  Final  Culture, blood (Routine X 2) w Reflex to ID Panel     Status: None   Collection Time: 12/06/20  4:11 PM   Specimen: BLOOD  Result Value Ref Range Status   Specimen Description   Final    BLOOD RIGHT ANTECUBITAL Performed at [place] [address] [place], [place] [address]    Special Requests   Final    BOTTLES DRAWN AEROBIC AND ANAEROBIC Blood Culture results may not be optimal due to an inadequate volume of blood received in culture bottles Performed at [place] [address], [place], [place] [address]    Culture   Final    NO GROWTH 5 DAYS Performed at [facility], [place] [address], [place], [place] [address]    Report Status 12/11/2020 FINAL  Final  Culture, Urine     Status: None   Collection Time: 12/07/20 10:17 AM   Specimen: Urine, Clean Catch  Result Value Ref Range Status   Specimen Description   Final    URINE, CLEAN CATCH Performed at [facility], [place] [address] [place], [place] [address]    Special Requests   Final    NONE Performed  at [facility], [place] [address], [place], [place] [address]    Culture   Final    NO GROWTH Performed at [facility], [place] [address] [place], [place] [address]     Report Status 12/08/2020 FINAL  Final  MRSA Next Gen by PCR, Nasal     Status: None   Collection Time: 12/07/20 11:00 AM   Specimen: Nasal Mucosa; Nasal Swab  Result Value Ref Range Status   MRSA by PCR Next Gen NOT DETECTED NOT DETECTED Final    Comment: (NOTE) The GeneXpert MRSA Assay (FDA approved for NASAL specimens only), is one component of a comprehensive MRSA colonization surveillance program. It is not intended to diagnose MRSA infection nor to guide or monitor treatment for MRSA infections. Test performance is not FDA approved in patients less than 48 years old. Performed at [facility], [place] [address] [place], [place] [address]           Radiology Studies: ECHOCARDIOGRAM COMPLETE  Result Date: 12/11/2020    ECHOCARDIOGRAM REPORT   Patient Name:   Cody Hurley Date of Exam: 12/11/2020 Medical Rec #:  [id number]    Height:       67.0 in Accession #:    [id number]   Weight:       135.4 lb Date of Birth:  08/05/1925     BSA:          1.713 m Patient Age:    53 years     BP:           112/64 mmHg Patient Gender: M            HR:           99 bpm. Exam Location:  Inpatient Procedure: 2D Echo, 3D Echo, Cardiac Doppler, Color Doppler and Strain Analysis Indications:    Z51.11 Encounter for antineoplastic chemotheraphy  History:        Patient has no prior history of Echocardiogram examinations.                 Cancer. Pulmonary embolus.  Sonographer:    Roseanna Rainbow RDCS Referring Phys: [place]  Sonographer Comments: Technically difficult study due to poor echo windows, suboptimal parasternal window and suboptimal subcostal window. Global longitudinal strain was attempted. Patient is in an irregular rhythm. IMPRESSIONS  1. Left ventricular ejection fraction, by estimation, is 65 to 70%. The left ventricle has normal function. The left ventricle has no regional wall motion abnormalities. Indeterminate diastolic filling due to E-A fusion.  2. Right ventricular  systolic function is normal. The right ventricular size is normal. There is normal pulmonary artery systolic pressure. The estimated right ventricular systolic pressure is 99.3 mmHg.  3. The mitral valve is degenerative. Trivial mitral valve regurgitation. No evidence of mitral stenosis.  4. The aortic valve is tricuspid. There is moderate calcification of the aortic valve. There is moderate thickening of the aortic valve. Aortic valve regurgitation is mild. Mild aortic valve stenosis. Aortic valve area, by VTI measures 1.79 cm. Aortic valve mean gradient measures 12.0 mmHg. Aortic valve Vmax measures 2.40 m/s.  5. The inferior vena cava is normal in size with greater than 50% respiratory variability, suggesting right atrial pressure of 3 mmHg. FINDINGS  Left Ventricle: Left ventricular ejection fraction, by estimation, is 65 to 70%. The left ventricle has normal function. The left ventricle has no regional wall motion abnormalities. Global longitudinal strain performed but not reported based  on interpreter judgement due to suboptimal tracking. 3D left ventricular ejection fraction analysis performed but not reported based on interpreter judgement due to suboptimal quality. The left ventricular internal cavity size was normal in size. There is no left ventricular hypertrophy. Indeterminate diastolic filling due to E-A fusion. Right Ventricle: The right ventricular size is normal. No increase in right ventricular wall thickness. Right ventricular systolic function is normal. There is normal pulmonary artery systolic pressure. The tricuspid regurgitant velocity is 2.37 m/s, and  with an assumed right atrial pressure of 3 mmHg, the estimated right ventricular systolic pressure is 94.4 mmHg. Left Atrium: Left atrial size was normal in size. Right Atrium: Right atrial size was normal in size. Pericardium: There is no evidence of pericardial effusion. Mitral Valve: The mitral valve is degenerative in appearance. Mild to  moderate mitral annular calcification. Trivial mitral valve regurgitation. No evidence of mitral valve stenosis. Tricuspid Valve: The tricuspid valve is grossly normal. Tricuspid valve regurgitation is trivial. No evidence of tricuspid stenosis. Aortic Valve: The aortic valve is tricuspid. There is moderate calcification of the aortic valve. There is moderate thickening of the aortic valve. Aortic valve regurgitation is mild. Mild aortic stenosis is present. Aortic valve mean gradient measures 12.0 mmHg. Aortic valve peak gradient measures 23.0 mmHg. Aortic valve area, by VTI measures 1.79 cm. Pulmonic Valve: The pulmonic valve was grossly normal. Pulmonic valve regurgitation is not visualized. No evidence of pulmonic stenosis. Aorta: The aortic root and ascending aorta are structurally normal, with no evidence of dilitation. Venous: The inferior vena cava is normal in size with greater than 50% respiratory variability, suggesting right atrial pressure of 3 mmHg. IAS/Shunts: The interatrial septum was not well visualized.  LEFT VENTRICLE PLAX 2D LVIDd:         2.80 cm     Diastology LVIDs:         2.00 cm     LV e' medial:    5.77 cm/s LV PW:         1.20 cm     LV E/e' medial:  19.3 LV IVS:        1.00 cm     LV e' lateral:   8.05 cm/s LVOT diam:     2.10 cm     LV E/e' lateral: 13.8 LV SV:         80 LV SV Index:   47 LVOT Area:     3.46 cm  LV Volumes (MOD) LV vol d, MOD A2C: 67.6 ml LV vol d, MOD A4C: 68.1 ml LV vol s, MOD A2C: 27.1 ml LV vol s, MOD A4C: 20.6 ml LV SV MOD A2C:     40.5 ml LV SV MOD A4C:     68.1 ml LV SV MOD BP:      44.5 ml RIGHT VENTRICLE             IVC RV S prime:     16.10 cm/s  IVC diam: 1.70 cm TAPSE (M-mode): 1.9 cm LEFT ATRIUM           Index       RIGHT ATRIUM           Index LA diam:      3.80 cm 2.22 cm/m  RA Area:     16.10 cm LA Vol (A4C): 19.4 ml 11.32 ml/m RA Volume:   49.00 ml  28.60 ml/m  AORTIC VALVE AV Area (Vmax):    1.75 cm AV Area (Vmean):  1.70 cm AV Area (VTI):      1.79 cm AV Vmax:           240.00 cm/s AV Vmean:          160.000 cm/s AV VTI:            0.449 m AV Peak Grad:      23.0 mmHg AV Mean Grad:      12.0 mmHg LVOT Vmax:         121.00 cm/s LVOT Vmean:        78.700 cm/s LVOT VTI:          0.232 m LVOT/AV VTI ratio: 0.52  AORTA Ao Root diam: 3.50 cm Ao Asc diam:  2.80 cm MITRAL VALVE                TRICUSPID VALVE MV Area (PHT): 3.15 cm     TR Peak grad:   22.5 mmHg MV Decel Time: 241 msec     TR Vmax:        237.00 cm/s MV E velocity: 111.28 cm/s MV A velocity: 118.33 cm/s  SHUNTS MV E/A ratio:  0.94         Systemic VTI:  0.23 m                             Systemic Diam: 2.10 cm Eleonore Chiquito MD Electronically signed by Eleonore Chiquito MD Signature Date/Time: 12/11/2020/4:21:09 PM    Final         Scheduled Meds:  acetaminophen  650 mg Oral Once   allopurinol  300 mg Oral Daily   apixaban  5 mg Oral BID   Chlorhexidine Gluconate Cloth  6 each Topical Daily   diphenhydrAMINE  25 mg Oral Once   feeding supplement  237 mL Oral BID BM   food thickener  1 packet Oral TID WC, HS, 0200   palonosetron  0.25 mg Intravenous Once   predniSONE  60 mg Oral Q breakfast   Continuous Infusions:  sodium chloride 75 mL/hr at 12/12/20 0211   sodium chloride Stopped (12/11/20 1622)   dexamethasone (DECADRON) IVPB ([facility])       LOS: 11 days    Time spent: 40 min    Georgette Shell, MD  12/12/2020, 1:59 PM

## 2020-12-12 NOTE — Progress Notes (Signed)
Physical Therapy Treatment Patient Details Name: Cody Hurley MRN: [id number] DOB: 08-03-1925 Today's Date: 12/12/2020    History of Present Illness Cody Hurley is a 87 y.o. male with history of rectal cancer last treated on 2017-experiencing increasing weakness and poor appetite.  Patient was diagnosed with COVID infection about 2 weeks ago was treated with antiviral and p.o. prednisone.  Subsequent which patient also had come to the ER on June 26 with shortness of breath and at the time patient CT angiogram showed 2 small pulmonary embolism and was placed on apixaban.  The CT scan also showed retroperitoneal lesions concerning for metastatic cancer.    PT Comments    Significant mobility decline Pt required increased assist to get OOB and was only able to amb 22 feet vs the 160 feet he did 2 days prior.   Pt had his first CHEMO treatment late yesterday.  He is c/o MAX fatigue/weakness.  Spouse in room and assisted Therapist.  Placed back in bed and positioned to comfort. Spouse wants to take pt home however she is physically unable to assist/lift him.  Will continue to follow as ST Rehab at SNF may need to be discussed.   Follow Up Recommendations  Home health PT;Supervision/Assistance - 24 hour;SNF     Equipment Recommendations  None recommended by PT    Recommendations for Other Services       Precautions / Restrictions Precautions Precautions: Fall Precaution Comments: colostomy, nectar thick liquids    Mobility  Bed Mobility Overal bed mobility: Needs Assistance Bed Mobility: Supine to Sit;Sit to Supine Rolling: Min assist;Mod assist   Supine to sit: Mod assist Sit to supine: Max assist   General bed mobility comments: Pt required increased assist this session    Transfers Overall transfer level: Needs assistance Equipment used: Rolling walker (2 wheeled) Transfers: Sit to/from [facility] Sit to Stand: Mod assist         General transfer  comment: pt required increased assist to power up this session  Ambulation/Gait Ambulation/Gait assistance: Mod assist Gait Distance (Feet): 22 Feet Assistive device: Rolling walker (2 wheeled) Gait Pattern/deviations: Step-through pattern;Decreased stride length Gait velocity: decr   General Gait Details: decreased amb distance and increased assist level this session vs last.  Unsteady.  Max c/o fatigue.   Stairs             Wheelchair Mobility    Modified [place] (Stroke Patients Only)       Balance                                            Cognition Arousal/Alertness: Awake/alert Behavior During Therapy: WFL for tasks assessed/performed Overall Cognitive Status: Within Functional Limits for tasks assessed                                 General Comments: AxO x 3 very pleasant but also very HOH      Exercises      General Comments        Pertinent Vitals/Pain Pain Assessment: Faces Faces Pain Scale: Hurts a little bit Pain Location: Chronic Back Pain Descriptors / Indicators: Grimacing;Guarding Pain Intervention(s): Monitored during session;Repositioned    Home Living  Prior Function            PT Goals (current goals can now be found in the care plan section) Progress towards PT goals: Progressing toward goals    Frequency    Min 3X/week      PT Plan Current plan remains appropriate    Co-evaluation              AM-PAC PT "6 Clicks" Mobility   Outcome Measure  Help needed turning from your back to your side while in a flat bed without using bedrails?: A Lot Help needed moving from lying on your back to sitting on the side of a flat bed without using bedrails?: A Lot Help needed moving to and from a bed to a chair (including a wheelchair)?: A Lot Help needed standing up from a chair using your arms (e.g., wheelchair or bedside chair)?: A Lot Help needed to walk in  hospital room?: A Lot Help needed climbing 3-5 steps with a railing? : Total 6 Click Score: 11    End of Session Equipment Utilized During Treatment: Gait belt Activity Tolerance: Patient tolerated treatment well Patient left: in bed;with call bell/phone within reach;with bed alarm set;with family/visitor present Nurse Communication: Mobility status PT Visit Diagnosis: Muscle weakness (generalized) (M62.81);Difficulty in walking, not elsewhere classified (R26.2)     Time: 6213-0865 PT Time Calculation (min) (ACUTE ONLY): 25 min  Charges:  $Gait Training: 8-22 mins $Therapeutic Activity: 8-22 mins                     {Aerielle Stoklosa  PTA Acute  Rehabilitation Services Pager      [phone] Office      [phone]

## 2020-12-12 NOTE — Progress Notes (Signed)
IP PROGRESS NOTE  Subjective:   Cody Hurley completed cycle 1 chemotherapy yesterday.  He reports he tolerated the chemotherapy well.  No nausea or symptom of an allergic reaction.  His wife and friend are at the bedside.  They report he is weaker today.  He was able to ambulate a short distance with physical therapy.  Objective: Vital signs in last 24 hours: Blood pressure 109/66, pulse 93, temperature (!) 97.2 F (36.2 C), temperature source Oral, resp. rate 18, height _0  (1.702 m), weight 135 lb 6.4 oz (61.4 kg), SpO2 93 %.  Intake/Output from previous day: 07/15 0701 - 07/16 0700 In: 2831.9 [P.O.:120; I.V.:2308.9; IV Piggyback:403] Out: 700 [Urine:700]  Physical Exam:  HEENT: Dryness of the tongue and buccal mucosa, no thrush Lungs: I clear anteriorly, no respiratory distress Cardiac: Regular rate and rhythm Abdomen: No hepatosplenomegaly, mass?  Right upper lateral abdomen, nontender, left lower quadrant colostomy Extremities: Trace pitting edema to lower leg bilaterally Neurologic: Alert, follows commands, oriented   Lab Results: Recent Labs    12/11/20 1015 12/12/20 0541  WBC 5.8 7.4  HGB 12.9* 13.9  HCT 38.8* 41.8  PLT 133* 122*    BMET Recent Labs    12/11/20 1015 12/12/20 0541  NA 136 135  K 3.5 4.4  CL 105 100  CO2 14* 20*  GLUCOSE 89 154*  BUN 12 16  CREATININE 0.32* 0.49*  CALCIUM 8.5* 9.5  Ionized calcium on 12/03/2020: 5.1  Lab Results  Component Value Date   CEA1 2.57 11/18/2020    Studies/Results: ECHOCARDIOGRAM COMPLETE  Result Date: 12/11/2020    ECHOCARDIOGRAM REPORT   Patient Name:   Cody Hurley Date of Exam: 12/11/2020 Medical Rec #:  [id number]    Height:       67.0 in Accession #:    [id number]   Weight:       135.4 lb Date of Birth:  09/29/1925     BSA:          1.713 m Patient Age:    87 years     BP:           112/64 mmHg Patient Gender: M            HR:           99 bpm. Exam Location:  Inpatient Procedure: 2D Echo, 3D Echo, Cardiac  Doppler, Color Doppler and Strain Analysis Indications:    Z51.11 Encounter for antineoplastic chemotheraphy  History:        Patient has no prior history of Echocardiogram examinations.                 Cancer. Pulmonary embolus.  Sonographer:    Roseanna Rainbow RDCS Referring Phys: [place]  Sonographer Comments: Technically difficult study due to poor echo windows, suboptimal parasternal window and suboptimal subcostal window. Global longitudinal strain was attempted. Patient is in an irregular rhythm. IMPRESSIONS  1. Left ventricular ejection fraction, by estimation, is 65 to 70%. The left ventricle has normal function. The left ventricle has no regional wall motion abnormalities. Indeterminate diastolic filling due to E-A fusion.  2. Right ventricular systolic function is normal. The right ventricular size is normal. There is normal pulmonary artery systolic pressure. The estimated right ventricular systolic pressure is 25.9 mmHg.  3. The mitral valve is degenerative. Trivial mitral valve regurgitation. No evidence of mitral stenosis.  4. The aortic valve is tricuspid. There is moderate calcification of the aortic valve. There is moderate  thickening of the aortic valve. Aortic valve regurgitation is mild. Mild aortic valve stenosis. Aortic valve area, by VTI measures 1.79 cm. Aortic valve mean gradient measures 12.0 mmHg. Aortic valve Vmax measures 2.40 m/s.  5. The inferior vena cava is normal in size with greater than 50% respiratory variability, suggesting right atrial pressure of 3 mmHg. FINDINGS  Left Ventricle: Left ventricular ejection fraction, by estimation, is 65 to 70%. The left ventricle has normal function. The left ventricle has no regional wall motion abnormalities. Global longitudinal strain performed but not reported based on interpreter judgement due to suboptimal tracking. 3D left ventricular ejection fraction analysis performed but not reported based on interpreter judgement due to  suboptimal quality. The left ventricular internal cavity size was normal in size. There is no left ventricular hypertrophy. Indeterminate diastolic filling due to E-A fusion. Right Ventricle: The right ventricular size is normal. No increase in right ventricular wall thickness. Right ventricular systolic function is normal. There is normal pulmonary artery systolic pressure. The tricuspid regurgitant velocity is 2.37 m/s, and  with an assumed right atrial pressure of 3 mmHg, the estimated right ventricular systolic pressure is 98.9 mmHg. Left Atrium: Left atrial size was normal in size. Right Atrium: Right atrial size was normal in size. Pericardium: There is no evidence of pericardial effusion. Mitral Valve: The mitral valve is degenerative in appearance. Mild to moderate mitral annular calcification. Trivial mitral valve regurgitation. No evidence of mitral valve stenosis. Tricuspid Valve: The tricuspid valve is grossly normal. Tricuspid valve regurgitation is trivial. No evidence of tricuspid stenosis. Aortic Valve: The aortic valve is tricuspid. There is moderate calcification of the aortic valve. There is moderate thickening of the aortic valve. Aortic valve regurgitation is mild. Mild aortic stenosis is present. Aortic valve mean gradient measures 12.0 mmHg. Aortic valve peak gradient measures 23.0 mmHg. Aortic valve area, by VTI measures 1.79 cm. Pulmonic Valve: The pulmonic valve was grossly normal. Pulmonic valve regurgitation is not visualized. No evidence of pulmonic stenosis. Aorta: The aortic root and ascending aorta are structurally normal, with no evidence of dilitation. Venous: The inferior vena cava is normal in size with greater than 50% respiratory variability, suggesting right atrial pressure of 3 mmHg. IAS/Shunts: The interatrial septum was not well visualized.  LEFT VENTRICLE PLAX 2D LVIDd:         2.80 cm     Diastology LVIDs:         2.00 cm     LV e' medial:    5.77 cm/s LV PW:         1.20  cm     LV E/e' medial:  19.3 LV IVS:        1.00 cm     LV e' lateral:   8.05 cm/s LVOT diam:     2.10 cm     LV E/e' lateral: 13.8 LV SV:         80 LV SV Index:   47 LVOT Area:     3.46 cm  LV Volumes (MOD) LV vol d, MOD A2C: 67.6 ml LV vol d, MOD A4C: 68.1 ml LV vol s, MOD A2C: 27.1 ml LV vol s, MOD A4C: 20.6 ml LV SV MOD A2C:     40.5 ml LV SV MOD A4C:     68.1 ml LV SV MOD BP:      44.5 ml RIGHT VENTRICLE             IVC RV S prime:  16.10 cm/s  IVC diam: 1.70 cm TAPSE (M-mode): 1.9 cm LEFT ATRIUM           Index       RIGHT ATRIUM           Index LA diam:      3.80 cm 2.22 cm/m  RA Area:     16.10 cm LA Vol (A4C): 19.4 ml 11.32 ml/m RA Volume:   49.00 ml  28.60 ml/m  AORTIC VALVE AV Area (Vmax):    1.75 cm AV Area (Vmean):   1.70 cm AV Area (VTI):     1.79 cm AV Vmax:           240.00 cm/s AV Vmean:          160.000 cm/s AV VTI:            0.449 m AV Peak Grad:      23.0 mmHg AV Mean Grad:      12.0 mmHg LVOT Vmax:         121.00 cm/s LVOT Vmean:        78.700 cm/s LVOT VTI:          0.232 m LVOT/AV VTI ratio: 0.52  AORTA Ao Root diam: 3.50 cm Ao Asc diam:  2.80 cm MITRAL VALVE                TRICUSPID VALVE MV Area (PHT): 3.15 cm     TR Peak grad:   22.5 mmHg MV Decel Time: 241 msec     TR Vmax:        237.00 cm/s MV E velocity: 111.28 cm/s MV A velocity: 118.33 cm/s  SHUNTS MV E/A ratio:  0.94         Systemic VTI:  0.23 m                             Systemic Diam: 2.10 cm Eleonore Chiquito MD Electronically signed by Eleonore Chiquito MD Signature Date/Time: 12/11/2020/4:21:09 PM    Final     Medications: I have reviewed the patient's current medications.  Assessment/Plan: Stage III rectal cancer,ypT4a,N2 status post a low anterior resection in September 2017 Presenting November 2016 with a mass at 10 cm from the anus, clinical stage II by EUS-T3N0 Neoadjuvant Xeloda and radiation beginning 06/01/2015, completed 20 of 28 planned fractions, discontinued secondary to toxicity Elevated pretreatment  CEA CT chest 04/10/2019-no evidence of metastatic disease, changes of interstitial lung disease, changes suggestive of cirrhosis CT chest 11/22/2020-scattered dependent groundglass airspace opacity and irregular interstitial opacity-nonspecific infectious or inflammatory, new bilateral adrenal nodules, extensive ill-defined soft tissue surrounding the right kidney suspicious for malignancy CT abdomen/pelvis 11/26/2020-bilateral adrenal masses, multinodular masslike soft tissue density throughout the right perinephric space and renal sinus, new left periaortic and retroperitoneal adenopathy, cirrhosis   Prostate cancer 1990 treated with a prostatectomy History of a TIA Carpal tunnel syndrome Palpable liver edge-ultrasound abdomen 04/10/2019-fatty infiltration of the liver, normal portal vein flow Left lung subsegmental pulmonary emboli 11/22/2020-apixaban Anorexia/weight loss COVID-19 infection June 2022 Admission 12/01/2020 with failure to thrive Elevated calcium-normal ionized calcium 12/03/2020 Retroperitoneal biopsy from 12/03/2020 consistent with large B-cell lymphoma CD20 positive, KI-67 95%, FISH panel pending Cycle 1 CVP-rituximab 12/10/2020    Mr. Manfred completed cycle 1 CVP-rituximab yesterday.  No apparent acute toxicity.  No evidence of tumor lysis syndrome.  He appears weaker today.  This may be related to chemotherapy.  I discussed disposition plans  with his wife this morning.  It remains unclear whether he will go to a skilled nursing facility versus home.  I suspect he will require a stay in a skilled nursing facility.  I encouraged them to get him up in the chair as tolerated.  Cycle 2 CVP-rituximab will be planned for 3 weeks as an outpatient.  He will not be able to receive another cycle of chemotherapy unless his performance status improves.  Recommendations: 1.  Continue normal saline at 75 cc/h. 2.  Continue allopurinol. 3.  Prednisone daily for 4 days starting today 4.  Leave  PICC in place at discharge   LOS: 11 days   Betsy Coder, MD   12/12/2020, 11:04 AM

## 2020-12-13 ENCOUNTER — Inpatient Hospital Stay (HOSPITAL_COMMUNITY): Payer: Medicare Other

## 2020-12-13 DIAGNOSIS — R1900 Intra-abdominal and pelvic swelling, mass and lump, unspecified site: Secondary | ICD-10-CM | POA: Diagnosis not present

## 2020-12-13 LAB — CBC WITH DIFFERENTIAL/PLATELET
Abs Immature Granulocytes: 0.05 10*3/uL (ref 0.00–0.07)
Basophils Absolute: 0 10*3/uL (ref 0.0–0.1)
Basophils Relative: 0 %
Eosinophils Absolute: 0 10*3/uL (ref 0.0–0.5)
Eosinophils Relative: 0 %
HCT: 40.5 % (ref 39.0–52.0)
Hemoglobin: 13.6 g/dL (ref 13.0–17.0)
Immature Granulocytes: 1 %
Lymphocytes Relative: 3 %
Lymphs Abs: 0.2 10*3/uL — ABNORMAL LOW (ref 0.7–4.0)
MCH: 34.4 pg — ABNORMAL HIGH (ref 26.0–34.0)
MCHC: 33.6 g/dL (ref 30.0–36.0)
MCV: 102.5 fL — ABNORMAL HIGH (ref 80.0–100.0)
Monocytes Absolute: 0.3 10*3/uL (ref 0.1–1.0)
Monocytes Relative: 5 %
Neutro Abs: 5.2 10*3/uL (ref 1.7–7.7)
Neutrophils Relative %: 91 %
Platelets: 92 10*3/uL — ABNORMAL LOW (ref 150–400)
RBC: 3.95 MIL/uL — ABNORMAL LOW (ref 4.22–5.81)
RDW: 17.1 % — ABNORMAL HIGH (ref 11.5–15.5)
WBC: 5.7 10*3/uL (ref 4.0–10.5)
nRBC: 0 % (ref 0.0–0.2)

## 2020-12-13 LAB — GLUCOSE, CAPILLARY
Glucose-Capillary: 146 mg/dL — ABNORMAL HIGH (ref 70–99)
Glucose-Capillary: 165 mg/dL — ABNORMAL HIGH (ref 70–99)
Glucose-Capillary: 208 mg/dL — ABNORMAL HIGH (ref 70–99)
Glucose-Capillary: 181 mg/dL — ABNORMAL HIGH (ref 70–99)

## 2020-12-13 LAB — COMPREHENSIVE METABOLIC PANEL
ALT: 35 U/L (ref 0–44)
AST: 63 U/L — ABNORMAL HIGH (ref 15–41)
Albumin: 2.4 g/dL — ABNORMAL LOW (ref 3.5–5.0)
Alkaline Phosphatase: 59 U/L (ref 38–126)
Anion gap: 5 (ref 5–15)
BUN: 16 mg/dL (ref 8–23)
CO2: 31 mmol/L (ref 22–32)
Calcium: 8.9 mg/dL (ref 8.9–10.3)
Chloride: 100 mmol/L (ref 98–111)
Creatinine, Ser: 0.4 mg/dL — ABNORMAL LOW (ref 0.61–1.24)
GFR, Estimated: >60 mL/min (ref >60)
Glucose, Bld: 154 mg/dL — ABNORMAL HIGH (ref 70–99)
Potassium: 3.4 mmol/L — ABNORMAL LOW (ref 3.5–5.1)
Sodium: 136 mmol/L (ref 135–145)
Total Bilirubin: 1 mg/dL (ref 0.3–1.2)
Total Protein: 5.4 g/dL — ABNORMAL LOW (ref 6.5–8.1)

## 2020-12-13 LAB — URIC ACID: Uric Acid, Serum: 3.2 mg/dL — ABNORMAL LOW (ref 3.7–8.6)

## 2020-12-13 MED ORDER — POTASSIUM CHLORIDE 10 MEQ/100ML IV SOLN
10.0000 meq | INTRAVENOUS | Status: AC
  Administered 2020-12-13 (×4): 10 meq via INTRAVENOUS
  Filled 2020-12-13: qty 100

## 2020-12-13 NOTE — Progress Notes (Signed)
IP PROGRESS NOTE  Subjective:   Mr. Scarano has no new complaint.  He continues to have back pain.  Objective: Vital signs in last 24 hours: Blood pressure (!) 110/57, pulse 78, temperature (!) 97.4 F (36.3 C), temperature source Oral, resp. rate 16, height 5' 7"  (1.702 m), weight 135 lb 6.4 oz (61.4 kg), SpO2 91 %.  Intake/Output from previous day: 07/16 0701 - 07/17 0700 In: -  Out: 550 [Urine:550]  Physical Exam:  HEENT: No thrush Lungs:  clear anteriorly, no respiratory distress Cardiac: Regular rate and rhythm Abdomen: No hepatosplenomegaly, mass?  Right upper lateral abdomen, nontender, left lower quadrant colostomy Extremities: Trace pitting edema to lower leg bilaterally, left greater than right Neurologic: Alert, follows commands, oriented   Lab Results: Recent Labs    12/12/20 0541 12/13/20 0611  WBC 7.4 5.7  HGB 13.9 13.6  HCT 41.8 40.5  PLT 122* 92*    BMET Recent Labs    12/12/20 0541 12/13/20 0611  NA 135 136  K 4.4 3.4*  CL 100 100  CO2 20* 31  GLUCOSE 154* 154*  BUN 16 16  CREATININE 0.49* 0.40*  CALCIUM 9.5 8.9  Ionized calcium on 12/03/2020: 5.1  Lab Results  Component Value Date   CEA1 2.57 11/18/2020    Studies/Results: DG Chest 1 View  Result Date: 12/13/2020 CLINICAL DATA:  Shortness of breath. Patient with rectal cancer and prostate cancer. Increasing weakness. EXAM: CHEST  1 VIEW COMPARISON:  December 03, 2020 FINDINGS: No pneumothorax. A right PICC line is identified with the distal tip terminating in the SVC. No pulmonary nodules or masses. No focal infiltrates. The cardiomediastinal silhouette is unremarkable. IMPRESSION: The right PICC line is in good position. No acute abnormalities are identified. Electronically Signed   By: Dorise Bullion III M.D   On: 12/13/2020 13:03   ECHOCARDIOGRAM COMPLETE  Result Date: 12/11/2020    ECHOCARDIOGRAM REPORT   Patient Name:   Cody Hurley Date of Exam: 12/11/2020 Medical Rec #:  [id number]     Height:       67.0 in Accession #:    [id number]   Weight:       135.4 lb Date of Birth:  August 10, 1925     BSA:          1.713 m Patient Age:    87 years     BP:           112/64 mmHg Patient Gender: M            HR:           99 bpm. Exam Location:  Inpatient Procedure: 2D Echo, 3D Echo, Cardiac Doppler, Color Doppler and Strain Analysis Indications:    Z51.11 Encounter for antineoplastic chemotheraphy  History:        Patient has no prior history of Echocardiogram examinations.                 Cancer. Pulmonary embolus.  Sonographer:    Roseanna Rainbow RDCS Referring Phys: [place]  Sonographer Comments: Technically difficult study due to poor echo windows, suboptimal parasternal window and suboptimal subcostal window. Global longitudinal strain was attempted. Patient is in an irregular rhythm. IMPRESSIONS  1. Left ventricular ejection fraction, by estimation, is 65 to 70%. The left ventricle has normal function. The left ventricle has no regional wall motion abnormalities. Indeterminate diastolic filling due to E-A fusion.  2. Right ventricular systolic function is normal. The right ventricular size is normal.  There is normal pulmonary artery systolic pressure. The estimated right ventricular systolic pressure is 68.1 mmHg.  3. The mitral valve is degenerative. Trivial mitral valve regurgitation. No evidence of mitral stenosis.  4. The aortic valve is tricuspid. There is moderate calcification of the aortic valve. There is moderate thickening of the aortic valve. Aortic valve regurgitation is mild. Mild aortic valve stenosis. Aortic valve area, by VTI measures 1.79 cm. Aortic valve mean gradient measures 12.0 mmHg. Aortic valve Vmax measures 2.40 m/s.  5. The inferior vena cava is normal in size with greater than 50% respiratory variability, suggesting right atrial pressure of 3 mmHg. FINDINGS  Left Ventricle: Left ventricular ejection fraction, by estimation, is 65 to 70%. The left ventricle has normal  function. The left ventricle has no regional wall motion abnormalities. Global longitudinal strain performed but not reported based on interpreter judgement due to suboptimal tracking. 3D left ventricular ejection fraction analysis performed but not reported based on interpreter judgement due to suboptimal quality. The left ventricular internal cavity size was normal in size. There is no left ventricular hypertrophy. Indeterminate diastolic filling due to E-A fusion. Right Ventricle: The right ventricular size is normal. No increase in right ventricular wall thickness. Right ventricular systolic function is normal. There is normal pulmonary artery systolic pressure. The tricuspid regurgitant velocity is 2.37 m/s, and  with an assumed right atrial pressure of 3 mmHg, the estimated right ventricular systolic pressure is 27.5 mmHg. Left Atrium: Left atrial size was normal in size. Right Atrium: Right atrial size was normal in size. Pericardium: There is no evidence of pericardial effusion. Mitral Valve: The mitral valve is degenerative in appearance. Mild to moderate mitral annular calcification. Trivial mitral valve regurgitation. No evidence of mitral valve stenosis. Tricuspid Valve: The tricuspid valve is grossly normal. Tricuspid valve regurgitation is trivial. No evidence of tricuspid stenosis. Aortic Valve: The aortic valve is tricuspid. There is moderate calcification of the aortic valve. There is moderate thickening of the aortic valve. Aortic valve regurgitation is mild. Mild aortic stenosis is present. Aortic valve mean gradient measures 12.0 mmHg. Aortic valve peak gradient measures 23.0 mmHg. Aortic valve area, by VTI measures 1.79 cm. Pulmonic Valve: The pulmonic valve was grossly normal. Pulmonic valve regurgitation is not visualized. No evidence of pulmonic stenosis. Aorta: The aortic root and ascending aorta are structurally normal, with no evidence of dilitation. Venous: The inferior vena cava is  normal in size with greater than 50% respiratory variability, suggesting right atrial pressure of 3 mmHg. IAS/Shunts: The interatrial septum was not well visualized.  LEFT VENTRICLE PLAX 2D LVIDd:         2.80 cm     Diastology LVIDs:         2.00 cm     LV e' medial:    5.77 cm/s LV PW:         1.20 cm     LV E/e' medial:  19.3 LV IVS:        1.00 cm     LV e' lateral:   8.05 cm/s LVOT diam:     2.10 cm     LV E/e' lateral: 13.8 LV SV:         80 LV SV Index:   47 LVOT Area:     3.46 cm  LV Volumes (MOD) LV vol d, MOD A2C: 67.6 ml LV vol d, MOD A4C: 68.1 ml LV vol s, MOD A2C: 27.1 ml LV vol s, MOD A4C: 20.6 ml LV SV  MOD A2C:     40.5 ml LV SV MOD A4C:     68.1 ml LV SV MOD BP:      44.5 ml RIGHT VENTRICLE             IVC RV S prime:     16.10 cm/s  IVC diam: 1.70 cm TAPSE (M-mode): 1.9 cm LEFT ATRIUM           Index       RIGHT ATRIUM           Index LA diam:      3.80 cm 2.22 cm/m  RA Area:     16.10 cm LA Vol (A4C): 19.4 ml 11.32 ml/m RA Volume:   49.00 ml  28.60 ml/m  AORTIC VALVE AV Area (Vmax):    1.75 cm AV Area (Vmean):   1.70 cm AV Area (VTI):     1.79 cm AV Vmax:           240.00 cm/s AV Vmean:          160.000 cm/s AV VTI:            0.449 m AV Peak Grad:      23.0 mmHg AV Mean Grad:      12.0 mmHg LVOT Vmax:         121.00 cm/s LVOT Vmean:        78.700 cm/s LVOT VTI:          0.232 m LVOT/AV VTI ratio: 0.52  AORTA Ao Root diam: 3.50 cm Ao Asc diam:  2.80 cm MITRAL VALVE                TRICUSPID VALVE MV Area (PHT): 3.15 cm     TR Peak grad:   22.5 mmHg MV Decel Time: 241 msec     TR Vmax:        237.00 cm/s MV E velocity: 111.28 cm/s MV A velocity: 118.33 cm/s  SHUNTS MV E/A ratio:  0.94         Systemic VTI:  0.23 m                             Systemic Diam: 2.10 cm Eleonore Chiquito MD Electronically signed by Eleonore Chiquito MD Signature Date/Time: 12/11/2020/4:21:09 PM    Final     Medications: I have reviewed the patient's current medications.  Assessment/Plan: Stage III rectal  cancer,ypT4a,N2 status post a low anterior resection in September 2017 Presenting November 2016 with a mass at 10 cm from the anus, clinical stage II by EUS-T3N0 Neoadjuvant Xeloda and radiation beginning 06/01/2015, completed 20 of 28 planned fractions, discontinued secondary to toxicity Elevated pretreatment CEA CT chest 04/10/2019-no evidence of metastatic disease, changes of interstitial lung disease, changes suggestive of cirrhosis CT chest 11/22/2020-scattered dependent groundglass airspace opacity and irregular interstitial opacity-nonspecific infectious or inflammatory, new bilateral adrenal nodules, extensive ill-defined soft tissue surrounding the right kidney suspicious for malignancy CT abdomen/pelvis 11/26/2020-bilateral adrenal masses, multinodular masslike soft tissue density throughout the right perinephric space and renal sinus, new left periaortic and retroperitoneal adenopathy, cirrhosis   Prostate cancer 1990 treated with a prostatectomy History of a TIA Carpal tunnel syndrome Palpable liver edge-ultrasound abdomen 04/10/2019-fatty infiltration of the liver, normal portal vein flow Left lung subsegmental pulmonary emboli 11/22/2020-apixaban Anorexia/weight loss COVID-19 infection June 2022 Admission 12/01/2020 with failure to thrive Elevated calcium-normal ionized calcium 12/03/2020 Retroperitoneal biopsy from 12/03/2020 consistent with large B-cell  lymphoma CD20 positive, KI-67 95%, FISH panel pending Cycle 1 CVP-rituximab 12/10/2020  12.  Thrombocytopenia secondary to chemotherapy and lymphoma   Mr. Behler is now at day 3 following cycle 1 CVP-rituximab.  He appears stable.  No evidence for tumor lysis syndrome.  No acute toxicity from the systemic therapy regimen.  I encouraged him to increase his diet and activity as tolerated.  His daughter is at the bedside this morning.  He remains undecided whether he will be discharged to home versus a skilled nursing facility.  Cycle 2  CVP-rituximab will be given as an outpatient. Recommendations: 1.  Continue normal saline at 75 cc/h. 2.  Continue allopurinol. 3.  Prednisone daily for 4 days total 4.  Leave PICC in place at discharge 5.  Increase ambulation and diet as tolerated 6.  Social work-contact wife to discuss disposition plans    LOS: 12 days   Betsy Coder, MD   12/13/2020, 2:09 PM

## 2020-12-13 NOTE — Progress Notes (Signed)
PROGRESS NOTE    Cody Hurley  GMW:[id number] DOB: 02-Dec-1925 DOA: 12/01/2020 PCP: [name], MD   Brief Narrative: 87 year old male with a history of rectal cancer and prostate cancer mated with increasing weakness decreased appetite and weight loss of about 20 to 30 pound in 1 month.  Patient had COVID infection 2 weeks prior to admission to hospital at that time he was treated with antiviral agents and steroids.  CT of the chest at that time showed small pulmonary embolism he was placed on Eliquis.  CT scan also showed retroperitoneal masses concerning for metastatic cancer.  Assessment & Plan:   Principal Problem:   Retroperitoneal mass Active Problems:   Pulmonary embolism (HCC)   FTT (failure to thrive) in adult   Hypercalcemia   History of rectal cancer   History of prostate cancer   COVID-19 virus infection: Recent COVID-19 infection status posttreatment   Acidosis   Lactic acidosis   Non-Hodgkin lymphoma (HCC)    #1 retroperitoneal renal mass concerning for metastatic cancer-status post biopsy consistent with aggressive B-cell lymphoma.  Patient received the first chemotherapy on 12/11/2020.   Will continue normal saline at 75 cc an hour. Labs today shows uric acid level of 3.2 potassium 3.4 rest of the labs have been stable. Reconsult physical therapy, out of bed, ambulate, patient has been weak and lives with his elderly wife at home who is unable to care for him at this time.  Patient will need SNF/rehab prior to returning home.  He is motivated to get better and go home.  #2 recent PE on Eliquis.  #3 history of rectal cancer and prostate cancer followed by [name].  #4 hypercalcemia likely secondary to malignancy however will DC multivitamins.  #5 recent COVID 19 he received treatment with Decadron and remdesivir.  Currently he is on room air.  #6 lactic acidosis likely secondary to malignancy.  ID was consulted.  Antibiotics stopped.  MRSA PCR negative.   CT abdomen and pelvis no acute abnormalities.  Chest x-ray showed interval worsening right midlung and suprahilar pulmonary opacities since the prior study. Anion gap has normalized. He was on bicarb which has been stopped. Repeat chest x-ray 12/13/2020.  #7 hypoglycemia-resolved.  He is on prednisone and he received Decadron 10 mg per oncology prior to chemotherapy which has increased his blood sugars.  Cover with SSI. CBG (last 3)  Recent Labs    12/12/20 1734 12/12/20 2121 12/13/20 0739  GLUCAP 265* 234* 146*    #8 left heel pain some erythema noted on the left heel keep both feet elevated will order Prevalon boots  Nutrition Problem: Inadequate oral intake Etiology: acute illness Signs/Symptoms: per patient/family report    Interventions: Ensure Enlive (each supplement provides 350kcal and 20 grams of protein), MVI, Magic cup  Estimated body mass index is 21.21 kg/m as calculated from the following:   Height as of this encounter: 5\' 7"  (1.702 m).   Weight as of this encounter: 61.4 kg.  DVT prophylaxis: Eliquis  code Status: DNR Family Communication: Daughter was at bedside today.  Disposition Plan:  Status is: Inpatient  Remains inpatient appropriate because:IV treatments appropriate due to intensity of illness or inability to take PO  Dispo: The patient is from: Home              Anticipated d/c is to: Home              Patient currently is not medically stable to d/c.   Difficult  to place patient No   Consultants:  Oncology palliative care interventional radiology infectious disease  Procedures: CT-guided needle biopsy of the right retroperitoneal mass 12/03/2020 Chemotherapy 12/11/2020  Antimicrobials: None  Subjective: Complains of left heel pain Objective: Vitals:   12/12/20 0438 12/12/20 1026 12/12/20 2126 12/13/20 0602  BP: 109/66 (!) 106/59 (!) 108/50 112/66  Pulse: 93 91 84 79  Resp: 18 16 18 18   Temp: (!) 97.2 F (36.2 C) 97.6 F (36.4 C) 97.9 F  (36.6 C) 97.9 F (36.6 C)  TempSrc: Oral Oral Oral Oral  SpO2: 93% 92% 91% 94%  Weight:      Height:        Intake/Output Summary (Last 24 hours) at 12/13/2020 0948 Last data filed at 12/12/2020 1925 Gross per 24 hour  Intake --  Output 550 ml  Net -550 ml    Filed Weights   12/04/20 0406  Weight: 61.4 kg    Examination:  General exam: Appears weaker Respiratory system: Scattered rhonchi o auscultation. Respiratory effort normal. Cardiovascular system: S1 & S2 heard, RRR. No JVD, murmurs, rubs, gallops or clicks. No pedal edema. Gastrointestinal system: Abdomen is nondistended, soft and nontender. No organomegaly or masses felt. Normal bowel sounds heard.  Colostomy in place with loose stools Central nervous system: Alert and oriented. No focal neurological deficits. Extremities: 1+ bilateral pitting edema, erythema to the left heel noted skin: No rashes, lesions or ulcers Psychiatry: Judgement and insight appear normal. Mood & affect appropriate.     Data Reviewed: I have personally reviewed following labs and imaging studies  CBC: Recent Labs  Lab 12/08/20 0501 12/08/20 0508 12/09/20 0516 12/10/20 0527 12/11/20 1015 12/12/20 0541 12/13/20 0611  WBC  --    < > 5.5 6.1 5.8 7.4 5.7  NEUTROABS 4.0  --  3.8  --   --  6.8 5.2  HGB  --    < > 13.6 13.6 12.9* 13.9 13.6  HCT  --    < > 41.2 41.6 38.8* 41.8 40.5  MCV  --    < > 105.1* 103.7* 105.4* 104.8* 102.5*  PLT  --    < > 165 159 133* 122* 92*   < > = values in this interval not displayed.    Basic Metabolic Panel: Recent Labs  Lab 12/07/20 0459 12/08/20 0508 12/09/20 0516 12/10/20 0527 12/11/20 1015 12/12/20 0541 12/13/20 0611  NA 135 137 137 134* 136 135 136  K 3.3* 3.3* 4.2 4.0 3.5 4.4 3.4*  CL 100 102 103 100 105 100 100  CO2 16* 14* 13* 19* 14* 20* 31  GLUCOSE 70 74 61* 65* 89 154* 154*  BUN 11 9 11 13 12 16 16   CREATININE 0.54* 0.62  0.50* 0.56* 0.53* 0.32* 0.49* 0.40*  CALCIUM 9.3 9.5 10.1  9.9 8.5* 9.5 8.9  MG 1.9 1.9 2.0  --   --   --   --   PHOS 2.4* 2.4* 2.6  --   --   --   --     GFR: Estimated Creatinine Clearance: 50.1 mL/min (A) (by C-G formula based on SCr of 0.4 mg/dL (L)). Liver Function Tests: Recent Labs  Lab 12/07/20 0459 12/08/20 0508 12/09/20 0516 12/10/20 0527 12/11/20 1015 12/12/20 0541 12/13/20 0611  AST 65*  --   --  63* 57* 62* 63*  ALT 33  --   --  33 30 32 35  ALKPHOS 69  --   --  71 56 66 59  BILITOT 0.7  --   --  1.1 1.0 0.8 1.0  PROT 5.6*  --   --  6.1* 5.1* 5.7* 5.4*  ALBUMIN 2.4*   < > 2.5* 2.7* 2.3* 2.6* 2.4*   < > = values in this interval not displayed.    No results for input(s): LIPASE, AMYLASE in the last 168 hours. No results for input(s): AMMONIA in the last 168 hours. Coagulation Profile: No results for input(s): INR, PROTIME in the last 168 hours. Cardiac Enzymes: No results for input(s): CKTOTAL, CKMB, CKMBINDEX, TROPONINI in the last 168 hours. BNP (last 3 results) No results for input(s): PROBNP in the last 8760 hours. HbA1C: No results for input(s): HGBA1C in the last 72 hours. CBG: Recent Labs  Lab 12/12/20 0734 12/12/20 1146 12/12/20 1734 12/12/20 2121 12/13/20 0739  GLUCAP 173* 216* 265* 234* 146*    Lipid Profile: No results for input(s): CHOL, HDL, LDLCALC, TRIG, CHOLHDL, LDLDIRECT in the last 72 hours. Thyroid Function Tests: No results for input(s): TSH, T4TOTAL, FREET4, T3FREE, THYROIDAB in the last 72 hours. Anemia Panel: No results for input(s): VITAMINB12, FOLATE, FERRITIN, TIBC, IRON, RETICCTPCT in the last 72 hours. Sepsis Labs: Recent Labs  Lab 12/06/20 1029 12/06/20 1342 12/06/20 1615 12/07/20 0459 12/08/20 0508  PROCALCITON  --   --   --  <0.10 <0.10  LATICACIDVEN >11.0* >11.0* >11.0* >11.0*  --      Recent Results (from the past 240 hour(s))  Culture, blood (Routine X 2) w Reflex to ID Panel     Status: None   Collection Time: 12/06/20  1:42 PM   Specimen: BLOOD  Result Value  Ref Range Status   Specimen Description   Final    BLOOD RIGHT ANTECUBITAL Performed at [facility] [address], [place], [place] [address]    Special Requests   Final    BOTTLES DRAWN AEROBIC ONLY Blood Culture adequate volume Performed at [facility] [address] [place], [place] [address]    Culture   Final    NO GROWTH 5 DAYS Performed at [facility], [place] [address] [place], [place] [address]    Report Status 12/11/2020 FINAL  Final  Culture, blood (Routine X 2) w Reflex to ID Panel     Status: None   Collection Time: 12/06/20  4:11 PM   Specimen: BLOOD  Result Value Ref Range Status   Specimen Description   Final    BLOOD RIGHT ANTECUBITAL Performed at [facility] [address] [place], [place] [address]    Special Requests   Final    BOTTLES DRAWN AEROBIC AND ANAEROBIC Blood Culture results may not be optimal due to an inadequate volume of blood received in culture bottles Performed at [facility] [address], [place], [place] [address]    Culture   Final    NO GROWTH 5 DAYS Performed at [facility], [place] [address], [place], [place] [address]    Report Status 12/11/2020 FINAL  Final  Culture, Urine     Status: None   Collection Time: 12/07/20 10:17 AM   Specimen: Urine, Clean Catch  Result Value Ref Range Status   Specimen Description   Final    URINE, CLEAN CATCH Performed at [facility], [place] [address] [place], [place] [address]    Special Requests   Final    NONE Performed at [facility], [place] [address]  [place], [place] [address]    Culture   Final    NO GROWTH Performed at [facility], [place] [address] [place], [place] [address]    Report Status 12/08/2020 FINAL  Final  MRSA Next Gen by PCR, Nasal     Status: None   Collection Time: 12/07/20 11:00 AM   Specimen: Nasal Mucosa; Nasal Swab  Result Value  Ref Range Status   MRSA by PCR Next Gen NOT DETECTED NOT DETECTED Final    Comment: (NOTE) The GeneXpert MRSA Assay (FDA approved for NASAL specimens only), is one component of a comprehensive MRSA colonization surveillance program. It is not intended to diagnose MRSA infection nor to guide or monitor treatment for MRSA infections. Test performance is not FDA approved in patients less than 43 years old. Performed at [facility], [place] [address] [place], [place] [address]           Radiology Studies: ECHOCARDIOGRAM COMPLETE  Result Date: 12/11/2020    ECHOCARDIOGRAM REPORT   Patient Name:   CHESKY HEYER Date of Exam: 12/11/2020 Medical Rec #:  [id number]    Height:       67.0 in Accession #:    [id number]   Weight:       135.4 lb Date of Birth:  [birth year]     BSA:          1.713 m Patient Age:    73 years     BP:           112/64 mmHg Patient Gender: M            HR:           99 bpm. Exam Location:  Inpatient Procedure: 2D Echo, 3D Echo, Cardiac Doppler, Color Doppler and Strain Analysis Indications:    Z51.11 Encounter for antineoplastic chemotheraphy  History:        Patient has no prior history of Echocardiogram examinations.                 Cancer. Pulmonary embolus.  Sonographer:    Roseanna Rainbow RDCS Referring Phys: [place]  Sonographer Comments: Technically difficult study due to poor echo windows, suboptimal parasternal window and suboptimal subcostal window. Global longitudinal strain was attempted. Patient is in an irregular rhythm. IMPRESSIONS  1. Left ventricular ejection fraction, by estimation, is 65 to 70%. The left ventricle has normal function. The left ventricle has no regional wall motion abnormalities. Indeterminate diastolic filling due to E-A fusion.  2. Right ventricular systolic function is normal. The right ventricular size is normal. There is normal pulmonary artery systolic pressure. The estimated right ventricular systolic pressure is 56.3  mmHg.  3. The mitral valve is degenerative. Trivial mitral valve regurgitation. No evidence of mitral stenosis.  4. The aortic valve is tricuspid. There is moderate calcification of the aortic valve. There is moderate thickening of the aortic valve. Aortic valve regurgitation is mild. Mild aortic valve stenosis. Aortic valve area, by VTI measures 1.79 cm. Aortic valve mean gradient measures 12.0 mmHg. Aortic valve Vmax measures 2.40 m/s.  5. The inferior vena cava is normal in size with greater than 50% respiratory variability, suggesting right atrial pressure of 3 mmHg. FINDINGS  Left Ventricle: Left ventricular ejection fraction, by estimation, is 65 to 70%. The left ventricle has normal function. The left ventricle has no regional wall motion abnormalities. Global longitudinal strain performed but not reported based on interpreter judgement due to suboptimal tracking. 3D left  ventricular ejection fraction analysis performed but not reported based on interpreter judgement due to suboptimal quality. The left ventricular internal cavity size was normal in size. There is no left ventricular hypertrophy. Indeterminate diastolic filling due to E-A fusion. Right Ventricle: The right ventricular size is normal. No increase in right ventricular wall thickness. Right ventricular systolic function is normal. There is normal pulmonary artery systolic pressure. The tricuspid regurgitant velocity is 2.37 m/s, and  with an assumed right atrial pressure of 3 mmHg, the estimated right ventricular systolic pressure is 56.2 mmHg. Left Atrium: Left atrial size was normal in size. Right Atrium: Right atrial size was normal in size. Pericardium: There is no evidence of pericardial effusion. Mitral Valve: The mitral valve is degenerative in appearance. Mild to moderate mitral annular calcification. Trivial mitral valve regurgitation. No evidence of mitral valve stenosis. Tricuspid Valve: The tricuspid valve is grossly normal.  Tricuspid valve regurgitation is trivial. No evidence of tricuspid stenosis. Aortic Valve: The aortic valve is tricuspid. There is moderate calcification of the aortic valve. There is moderate thickening of the aortic valve. Aortic valve regurgitation is mild. Mild aortic stenosis is present. Aortic valve mean gradient measures 12.0 mmHg. Aortic valve peak gradient measures 23.0 mmHg. Aortic valve area, by VTI measures 1.79 cm. Pulmonic Valve: The pulmonic valve was grossly normal. Pulmonic valve regurgitation is not visualized. No evidence of pulmonic stenosis. Aorta: The aortic root and ascending aorta are structurally normal, with no evidence of dilitation. Venous: The inferior vena cava is normal in size with greater than 50% respiratory variability, suggesting right atrial pressure of 3 mmHg. IAS/Shunts: The interatrial septum was not well visualized.  LEFT VENTRICLE PLAX 2D LVIDd:         2.80 cm     Diastology LVIDs:         2.00 cm     LV e' medial:    5.77 cm/s LV PW:         1.20 cm     LV E/e' medial:  19.3 LV IVS:        1.00 cm     LV e' lateral:   8.05 cm/s LVOT diam:     2.10 cm     LV E/e' lateral: 13.8 LV SV:         80 LV SV Index:   47 LVOT Area:     3.46 cm  LV Volumes (MOD) LV vol d, MOD A2C: 67.6 ml LV vol d, MOD A4C: 68.1 ml LV vol s, MOD A2C: 27.1 ml LV vol s, MOD A4C: 20.6 ml LV SV MOD A2C:     40.5 ml LV SV MOD A4C:     68.1 ml LV SV MOD BP:      44.5 ml RIGHT VENTRICLE             IVC RV S prime:     16.10 cm/s  IVC diam: 1.70 cm TAPSE (M-mode): 1.9 cm LEFT ATRIUM           Index       RIGHT ATRIUM           Index LA diam:      3.80 cm 2.22 cm/m  RA Area:     16.10 cm LA Vol (A4C): 19.4 ml 11.32 ml/m RA Volume:   49.00 ml  28.60 ml/m  AORTIC VALVE AV Area (Vmax):    1.75 cm AV Area (Vmean):   1.70 cm AV Area (VTI):  1.79 cm AV Vmax:           240.00 cm/s AV Vmean:          160.000 cm/s AV VTI:            0.449 m AV Peak Grad:      23.0 mmHg AV Mean Grad:      12.0 mmHg LVOT  Vmax:         121.00 cm/s LVOT Vmean:        78.700 cm/s LVOT VTI:          0.232 m LVOT/AV VTI ratio: 0.52  AORTA Ao Root diam: 3.50 cm Ao Asc diam:  2.80 cm MITRAL VALVE                TRICUSPID VALVE MV Area (PHT): 3.15 cm     TR Peak grad:   22.5 mmHg MV Decel Time: 241 msec     TR Vmax:        237.00 cm/s MV E velocity: 111.28 cm/s MV A velocity: 118.33 cm/s  SHUNTS MV E/A ratio:  0.94         Systemic VTI:  0.23 m                             Systemic Diam: 2.10 cm Eleonore Chiquito MD Electronically signed by Eleonore Chiquito MD Signature Date/Time: 12/11/2020/4:21:09 PM    Final         Scheduled Meds:  acetaminophen  650 mg Oral Once   allopurinol  300 mg Oral Daily   apixaban  5 mg Oral BID   Chlorhexidine Gluconate Cloth  6 each Topical Daily   diphenhydrAMINE  25 mg Oral Once   feeding supplement  237 mL Oral BID BM   food thickener  1 packet Oral TID WC, HS, 0200   palonosetron  0.25 mg Intravenous Once   predniSONE  60 mg Oral Q breakfast   Continuous Infusions:  sodium chloride 75 mL/hr at 12/12/20 1556   sodium chloride Stopped (12/11/20 1622)   dexamethasone (DECADRON) IVPB ([facility])       LOS: 12 days    Time spent: 40 min    Georgette Shell, MD  12/13/2020, 9:48 AM

## 2020-12-14 DIAGNOSIS — L8990 Pressure ulcer of unspecified site, unspecified stage: Secondary | ICD-10-CM | POA: Insufficient documentation

## 2020-12-14 DIAGNOSIS — R627 Adult failure to thrive: Secondary | ICD-10-CM | POA: Diagnosis not present

## 2020-12-14 DIAGNOSIS — R531 Weakness: Secondary | ICD-10-CM | POA: Diagnosis not present

## 2020-12-14 DIAGNOSIS — R1900 Intra-abdominal and pelvic swelling, mass and lump, unspecified site: Secondary | ICD-10-CM | POA: Diagnosis not present

## 2020-12-14 DIAGNOSIS — Z515 Encounter for palliative care: Secondary | ICD-10-CM | POA: Diagnosis not present

## 2020-12-14 DIAGNOSIS — C8513 Unspecified B-cell lymphoma, intra-abdominal lymph nodes: Secondary | ICD-10-CM | POA: Diagnosis not present

## 2020-12-14 LAB — CBC WITH DIFFERENTIAL/PLATELET
Abs Immature Granulocytes: 0.02 10*3/uL (ref 0.00–0.07)
Basophils Absolute: 0 10*3/uL (ref 0.0–0.1)
Basophils Relative: 0 %
Eosinophils Absolute: 0 10*3/uL (ref 0.0–0.5)
Eosinophils Relative: 0 %
HCT: 41.1 % (ref 39.0–52.0)
Hemoglobin: 13.9 g/dL (ref 13.0–17.0)
Immature Granulocytes: 0 %
Lymphocytes Relative: 4 %
Lymphs Abs: 0.2 10*3/uL — ABNORMAL LOW (ref 0.7–4.0)
MCH: 35.2 pg — ABNORMAL HIGH (ref 26.0–34.0)
MCHC: 33.8 g/dL (ref 30.0–36.0)
MCV: 104.1 fL — ABNORMAL HIGH (ref 80.0–100.0)
Monocytes Absolute: 0.2 10*3/uL (ref 0.1–1.0)
Monocytes Relative: 4 %
Neutro Abs: 4.7 10*3/uL (ref 1.7–7.7)
Neutrophils Relative %: 92 %
Platelets: 82 10*3/uL — ABNORMAL LOW (ref 150–400)
RBC: 3.95 MIL/uL — ABNORMAL LOW (ref 4.22–5.81)
RDW: 17 % — ABNORMAL HIGH (ref 11.5–15.5)
WBC: 5.2 10*3/uL (ref 4.0–10.5)
nRBC: 0 % (ref 0.0–0.2)

## 2020-12-14 LAB — URIC ACID: Uric Acid, Serum: 2.5 mg/dL — ABNORMAL LOW (ref 3.7–8.6)

## 2020-12-14 LAB — COMPREHENSIVE METABOLIC PANEL
ALT: 46 U/L — ABNORMAL HIGH (ref 0–44)
AST: 74 U/L — ABNORMAL HIGH (ref 15–41)
Albumin: 2.5 g/dL — ABNORMAL LOW (ref 3.5–5.0)
Alkaline Phosphatase: 69 U/L (ref 38–126)
Anion gap: 5 (ref 5–15)
BUN: 14 mg/dL (ref 8–23)
CO2: 32 mmol/L (ref 22–32)
Calcium: 8.9 mg/dL (ref 8.9–10.3)
Chloride: 102 mmol/L (ref 98–111)
Creatinine, Ser: 0.41 mg/dL — ABNORMAL LOW (ref 0.61–1.24)
GFR, Estimated: >60 mL/min (ref >60)
Glucose, Bld: 165 mg/dL — ABNORMAL HIGH (ref 70–99)
Potassium: 3.4 mmol/L — ABNORMAL LOW (ref 3.5–5.1)
Sodium: 139 mmol/L (ref 135–145)
Total Bilirubin: 1.2 mg/dL (ref 0.3–1.2)
Total Protein: 5.5 g/dL — ABNORMAL LOW (ref 6.5–8.1)

## 2020-12-14 LAB — GLUCOSE, CAPILLARY
Glucose-Capillary: 133 mg/dL — ABNORMAL HIGH (ref 70–99)
Glucose-Capillary: 180 mg/dL — ABNORMAL HIGH (ref 70–99)
Glucose-Capillary: 220 mg/dL — ABNORMAL HIGH (ref 70–99)
Glucose-Capillary: 203 mg/dL — ABNORMAL HIGH (ref 70–99)

## 2020-12-14 LAB — MAGNESIUM: Magnesium: 2.3 mg/dL (ref 1.7–2.4)

## 2020-12-14 MED ORDER — POTASSIUM CHLORIDE 10 MEQ/100ML IV SOLN
10.0000 meq | INTRAVENOUS | Status: AC
  Administered 2020-12-14 (×3): 10 meq via INTRAVENOUS
  Filled 2020-12-14: qty 100

## 2020-12-14 NOTE — Progress Notes (Signed)
Patient ID: Cody Hurley, male   DOB: [birth year], 87 y.o.   MRN: [id number]    Progress Note from the Palliative Medicine Team at [facility]   Patient Name: Cody Hurley        Date: 12/14/2020 DOB: 1925/12/27  Age: 87 y.o. MRN#: [id number] Attending Physician: Georgette Shell, MD Primary Care Physician: [name], MD Admit Date: 12/01/2020   Medical records reviewed   87 y.o. male  admitted on 12/01/2020 with hx of prostate ca in 1990s, rectal cancer in 2019 s/p low anterior resection with prior chemoradiation, and recently left lung subsegmental pulmonary emboli. He reports having unintentional weight loss and malaise and weakness/failure to thrive. He was recovering from covid roughly 2 weeks ago, complicated by PE. The CT angio for evaluation of PE also showed retroperitoneal lesions concerning for new metastatic cancer. He was admitted on 7/5, underwent biopsy with path still pending. Patient reports having dry mouth, no cough. ID asked to evaluate for reason of elevated LA. On labs has persistent LA of > 11. Procalcitonin is negative   CT showed:  Bulky multinodular soft tissue density in the right Peri and pararenal spaces concerning for metastatic disease and/or lymphoma. Additional periaortic/retroperitoneal adenopathy and conspicuous nodular thickening of the adrenal glands compatible with additional metastatic deposits.   Patient and family face treatment option decisions, advanced directive decisions and anticipatory care needs.  This NP visited patient at the bedside as a follow up to  for palliative medicine needs and emotional support.   Daughter at bedside.  Patient is resting quietly and had limited participation in today conversation   Patient had a change in decision regarding treatment plan and has received first chemotherapy treatment/ Dr [name] is his oncologist.  Plan is to discharge to SNF for  rehab.   Daughter verbalizes understanding of the seriousness  of patient's current medical situation and increasing nursing care needs.   Plan of care - DNR/DNI - Short-term SNF for rehabilitation, ultimately hope is to rerun home   - continue with cancer treatment under Dr [name]   Continued education to both patient and family to consider best case scenario versus worst-case scenario for this patient at this time in this situation, now on into the future.  Patient is high risk for decompensation  Education offered again on home hospice benefit.  Questions and concerns addressed    Discussed with Dr [name]  Total time spent on the unit was 25 minutes  Greater than 50% of the time was spent in counseling and coordination of care  Wadie Lessen NP  Palliative Medicine Team Team Phone # [phone] Pager [phone]

## 2020-12-14 NOTE — Progress Notes (Signed)
Physical Therapy Treatment Patient Details Name: Cody Hurley MRN: [id number] DOB: January 22, 1926 Today's Date: 12/14/2020    History of Present Illness Cody Hurley is a 87 y.o. male with history of rectal cancer last treated on 2017-experiencing increasing weakness and poor appetite.  Patient was diagnosed with COVID infection about 2 weeks ago was treated with antiviral and p.o. prednisone.  Subsequent which patient also had come to the ER on June 26 with shortness of breath and at the time patient CT angiogram showed 2 small pulmonary embolism and was placed on apixaban.  The CT scan also showed retroperitoneal lesions concerning for metastatic cancer.    PT Comments    Pt progressing, incr amb distance to tolerance today. Reviewed exercises with pt and dtr. Recommend SNF post acute. Will continue to follow   Follow Up Recommendations  SNF     Equipment Recommendations  None recommended by PT    Recommendations for Other Services       Precautions / Restrictions Precautions Precautions: Fall Precaution Comments: colostomy    Mobility  Bed Mobility Overal bed mobility: Needs Assistance Bed Mobility: Supine to Sit;Sit to Supine   Sidelying to sit: Min assist;Mod assist;HOB elevated Supine to sit: Min assist     General bed mobility comments: partial roll, uses bed rail, assist to elevate trunk, incr time to scoot to EOB, cues to self assist. light min assist to lift LEs on to bed    Transfers Overall transfer level: Needs assistance Equipment used: Rolling walker (2 wheeled) Transfers: Sit to/from Stand Sit to Stand: Min assist;From elevated surface         General transfer comment: cues for incr knee flexion, for anterior -superior wt shift, min assist to power up to standing  Ambulation/Gait Ambulation/Gait assistance: Min assist Gait Distance (Feet): 60 Feet Assistive device: Rolling walker (2 wheeled) Gait Pattern/deviations: Step-through pattern;Decreased  stride length Gait velocity: decr   General Gait Details: multi-modal cues for posture, step length, intermittent assist to maneuver RW   Stairs             Wheelchair Mobility    Modified Rankin (Stroke Patients Only)       Balance   Sitting-balance support: No upper extremity supported;Feet supported Sitting balance-Leahy Scale: Fair       Standing balance-Leahy Scale: Poor Standing balance comment: reliant on RW for static standing                            Cognition Arousal/Alertness: Awake/alert Behavior During Therapy: WFL for tasks assessed/performed Overall Cognitive Status: Within Functional Limits for tasks assessed                                 General Comments: AxO x 3 very pleasant but also very Geophysical data processor Exercises - Lower Extremity Long Arc Quad: AROM;Both;10 reps;Seated Toe Raises: AROM;Both;10 reps;Seated Heel Raises: AROM;Both;10 reps    General Comments        Pertinent Vitals/Pain Pain Assessment: No/denies pain    Home Living                      Prior Function            PT Goals (current goals can now be found in the care plan section) Acute Rehab PT Goals Patient Stated Goal: get stronger and  return home PT Goal Formulation: With patient/family Time For Goal Achievement: 12/16/20 Potential to Achieve Goals: Fair Progress towards PT goals: Progressing toward goals    Frequency    Min 3X/week      PT Plan Current plan remains appropriate    Co-evaluation              AM-PAC PT "6 Clicks" Mobility   Outcome Measure  Help needed turning from your back to your side while in a flat bed without using bedrails?: A Lot Help needed moving from lying on your back to sitting on the side of a flat bed without using bedrails?: A Lot Help needed moving to and from a bed to a chair (including a wheelchair)?: A Lot Help needed standing up from a chair using your arms  (e.g., wheelchair or bedside chair)?: A Lot Help needed to walk in hospital room?: A Lot Help needed climbing 3-5 steps with a railing? : Total 6 Click Score: 11    End of Session Equipment Utilized During Treatment: Gait belt Activity Tolerance: Patient tolerated treatment well Patient left: in bed;with call bell/phone within reach;with bed alarm set;with family/visitor present Nurse Communication: Mobility status PT Visit Diagnosis: Muscle weakness (generalized) (M62.81);Difficulty in walking, not elsewhere classified (R26.2)     Time: 0626-9485 PT Time Calculation (min) (ACUTE ONLY): 25 min  Charges:  $Gait Training: 23-37 mins                     Baxter Flattery, PT  Acute Rehab Dept ([place]) [phone] Pager [phone]  12/14/2020    [facility] 12/14/2020, 2:51 PM

## 2020-12-14 NOTE — Progress Notes (Signed)
PROGRESS NOTE    Cody Hurley  HFS:[id number] DOB: 08/01/1925 DOA: 12/01/2020 PCP: [name], MD   Brief Narrative: 87 year old male with a history of rectal cancer and prostate cancer mated with increasing weakness decreased appetite and weight loss of about 20 to 30 pound in 1 month.  Patient had COVID infection 2 weeks prior to admission to hospital at that time he was treated with antiviral agents and steroids.  CT of the chest at that time showed small pulmonary embolism he was placed on Eliquis.  CT scan also showed retroperitoneal masses concerning for metastatic cancer.  Assessment & Plan:   Principal Problem:   Retroperitoneal mass Active Problems:   Pulmonary embolism (HCC)   FTT (failure to thrive) in adult   Hypercalcemia   History of rectal cancer   History of prostate cancer   COVID-19 virus infection: Recent COVID-19 infection status posttreatment   Acidosis   Lactic acidosis   Non-Hodgkin lymphoma (HCC)   Pressure injury of skin    #1 retroperitoneal renal mass concerning for metastatic cancer-status post biopsy consistent with aggressive B-cell lymphoma.  Patient received the first chemotherapy on 12/11/2020.   Will continue normal saline at 75 cc an hour. Labs  have been stable. PT recommending SNF.  Patient lives with his elderly wife  105 years old. TOC looking at [facility] in [place].  #2 recent PE on Eliquis.  #3 history of rectal cancer and prostate cancer followed by [name].  #4 hypercalcemia resolved.  #5 recent COVID 19 he received treatment with Decadron and remdesivir.  Currently he is on room air.  #6 lactic acidosis likely secondary to malignancy.  ID was consulted.  Antibiotics stopped.  MRSA PCR negative.   CT abdomen and pelvis no acute abnormalities.   Anion gap has normalized. He was on bicarb which has been stopped. Repeat chest x-ray 12/13/2020-no acute findings.  #7 hypoglycemia-resolved.  He is on  prednisone and he received Decadron 10 mg per oncology prior to chemotherapy which has increased his blood sugars.  Cover with SSI. CBG (last 3)  Recent Labs    12/13/20 2218 12/14/20 0756 12/14/20 1211  GLUCAP 181* 133* 180*     #8 left heel pain some erythema noted on the left heel keep both feet elevated will order Prevalon boots  Nutrition Problem: Inadequate oral intake Etiology: acute illness Signs/Symptoms: per patient/family report    Interventions: Ensure Enlive (each supplement provides 350kcal and 20 grams of protein), MVI, Magic cup  Estimated body mass index is 21.21 kg/m as calculated from the following:   Height as of this encounter: 5\' 7"  (1.702 m).   Weight as of this encounter: 61.4 kg.  DVT prophylaxis: Eliquis  code Status: DNR Family Communication: Discussed with wife at bedside disposition Plan:  Status is: Inpatient  Remains inpatient appropriate because:IV treatments appropriate due to intensity of illness or inability to take PO  Dispo: The patient is from: Home              Anticipated d/c is to: Home              Patient currently is not medically stable to d/c.   Difficult to place patient No   Consultants:  Oncology palliative care interventional radiology infectious disease  Procedures: CT-guided needle biopsy of the right retroperitoneal mass 12/03/2020 Chemotherapy 12/11/2020  Antimicrobials: None  Subjective: Wife by the bedside patient is awake and alert ate a good breakfast  Slept throughout  the night  objective: Vitals:   12/13/20 0602 12/13/20 1053 12/13/20 2051 12/14/20 0528  BP: 112/66 (!) 110/57 (!) 101/57 120/74  Pulse: 79 78 74 71  Resp: 18 16 17 16   Temp: 97.9 F (36.6 C) (!) 97.4 F (36.3 C) 97.8 F (36.6 C) 97.6 F (36.4 C)  TempSrc: Oral Oral Oral Oral  SpO2: 94% 91% 92% 93%  Weight:      Height:        Intake/Output Summary (Last 24 hours) at 12/14/2020 1408 Last data filed at 12/14/2020 0925 Gross per 24  hour  Intake 4704.91 ml  Output 1100 ml  Net 3604.91 ml    Filed Weights   12/04/20 0406  Weight: 61.4 kg    Examination:  General exam: Appears weaker Respiratory system: Scattered rhonchi o auscultation. Respiratory effort normal. Cardiovascular system: S1 & S2 heard, RRR. No JVD, murmurs, rubs, gallops or clicks. No pedal edema. Gastrointestinal system: Abdomen is nondistended, soft and nontender. No organomegaly or masses felt. Normal bowel sounds heard.  Colostomy in place with loose stools Central nervous system: Alert and oriented. No focal neurological deficits. Extremities: 1+ bilateral pitting edema, erythema to the left heel noted skin: No rashes, lesions or ulcers Psychiatry: Judgement and insight appear normal. Mood & affect appropriate.     Data Reviewed: I have personally reviewed following labs and imaging studies  CBC: Recent Labs  Lab 12/08/20 0501 12/08/20 0508 12/09/20 0516 12/10/20 0527 12/11/20 1015 12/12/20 0541 12/13/20 0611 12/14/20 0941  WBC  --    < > 5.5 6.1 5.8 7.4 5.7 5.2  NEUTROABS 4.0  --  3.8  --   --  6.8 5.2 4.7  HGB  --    < > 13.6 13.6 12.9* 13.9 13.6 13.9  HCT  --    < > 41.2 41.6 38.8* 41.8 40.5 41.1  MCV  --    < > 105.1* 103.7* 105.4* 104.8* 102.5* 104.1*  PLT  --    < > 165 159 133* 122* 92* 82*   < > = values in this interval not displayed.    Basic Metabolic Panel: Recent Labs  Lab 12/08/20 0508 12/09/20 0516 12/10/20 0527 12/11/20 1015 12/12/20 0541 12/13/20 0611 12/14/20 0941  NA 137 137 134* 136 135 136 139  K 3.3* 4.2 4.0 3.5 4.4 3.4* 3.4*  CL 102 103 100 105 100 100 102  CO2 14* 13* 19* 14* 20* 31 32  GLUCOSE 74 61* 65* 89 154* 154* 165*  BUN 9 11 13 12 16 16 14   CREATININE 0.62  0.50* 0.56* 0.53* 0.32* 0.49* 0.40* 0.41*  CALCIUM 9.5 10.1 9.9 8.5* 9.5 8.9 8.9  MG 1.9 2.0  --   --   --   --   --   PHOS 2.4* 2.6  --   --   --   --   --     GFR: Estimated Creatinine Clearance: 50.1 mL/min (A) (by C-G  formula based on SCr of 0.41 mg/dL (L)). Liver Function Tests: Recent Labs  Lab 12/10/20 0527 12/11/20 1015 12/12/20 0541 12/13/20 0611 12/14/20 0941  AST 63* 57* 62* 63* 74*  ALT 33 30 32 35 46*  ALKPHOS 71 56 66 59 69  BILITOT 1.1 1.0 0.8 1.0 1.2  PROT 6.1* 5.1* 5.7* 5.4* 5.5*  ALBUMIN 2.7* 2.3* 2.6* 2.4* 2.5*    No results for input(s): LIPASE, AMYLASE in the last 168 hours. No results for input(s): AMMONIA in the last 168 hours.  Coagulation Profile: No results for input(s): INR, PROTIME in the last 168 hours. Cardiac Enzymes: No results for input(s): CKTOTAL, CKMB, CKMBINDEX, TROPONINI in the last 168 hours. BNP (last 3 results) No results for input(s): PROBNP in the last 8760 hours. HbA1C: No results for input(s): HGBA1C in the last 72 hours. CBG: Recent Labs  Lab 12/13/20 1216 12/13/20 1632 12/13/20 2218 12/14/20 0756 12/14/20 1211  GLUCAP 165* 208* 181* 133* 180*    Lipid Profile: No results for input(s): CHOL, HDL, LDLCALC, TRIG, CHOLHDL, LDLDIRECT in the last 72 hours. Thyroid Function Tests: No results for input(s): TSH, T4TOTAL, FREET4, T3FREE, THYROIDAB in the last 72 hours. Anemia Panel: No results for input(s): VITAMINB12, FOLATE, FERRITIN, TIBC, IRON, RETICCTPCT in the last 72 hours. Sepsis Labs: Recent Labs  Lab 12/08/20 0508  PROCALCITON <0.10     Recent Results (from the past 240 hour(s))  Culture, blood (Routine X 2) w Reflex to ID Panel     Status: None   Collection Time: 12/06/20  1:42 PM   Specimen: BLOOD  Result Value Ref Range Status   Specimen Description   Final    BLOOD RIGHT ANTECUBITAL Performed at [facility] [address] [place], [place] [address]    Special Requests   Final    BOTTLES DRAWN AEROBIC ONLY Blood Culture adequate volume Performed at [facility] [address], [place], [place] [address]    Culture   Final    NO GROWTH 5 DAYS Performed at [facility],  [place] [address] [place], [place] [address]    Report Status 12/11/2020 FINAL  Final  Culture, blood (Routine X 2) w Reflex to ID Panel     Status: None   Collection Time: 12/06/20  4:11 PM   Specimen: BLOOD  Result Value Ref Range Status   Specimen Description   Final    BLOOD RIGHT ANTECUBITAL Performed at [facility] [address], [place], [place] [address]    Special Requests   Final    BOTTLES DRAWN AEROBIC AND ANAEROBIC Blood Culture results may not be optimal due to an inadequate volume of blood received in culture bottles Performed at [facility] [address] [place], [place] [address]    Culture   Final    NO GROWTH 5 DAYS Performed at [facility], [place] [address], [place], [place] [address]    Report Status 12/11/2020 FINAL  Final  Culture, Urine     Status: None   Collection Time: 12/07/20 10:17 AM   Specimen: Urine, Clean Catch  Result Value Ref Range Status   Specimen Description   Final    URINE, CLEAN CATCH Performed at [facility], [place] [address] [place], [place] [address]    Special Requests   Final    NONE Performed at [facility], [place] [address] [place], [place] [address]    Culture   Final    NO GROWTH Performed at [facility], [place] [address], [place], [place] [address]    Report Status 12/08/2020 FINAL  Final  MRSA Next Gen by PCR, Nasal     Status: None   Collection Time: 12/07/20 11:00 AM   Specimen: Nasal Mucosa; Nasal Swab  Result Value Ref Range Status   MRSA by PCR Next Gen NOT DETECTED NOT DETECTED Final    Comment: (NOTE) The GeneXpert MRSA Assay (FDA approved for NASAL specimens only), is one  component of a comprehensive MRSA colonization surveillance program. It is not intended to diagnose MRSA infection nor to guide or monitor treatment for MRSA infections. Test performance is not FDA approved in patients less than 77 years old. Performed at  [facility], [place] [address], [place],  [address]           Radiology Studies: DG Chest 1 View  Result Date: 12/13/2020 CLINICAL DATA:  Shortness of breath. Patient with rectal cancer and prostate cancer. Increasing weakness. EXAM: CHEST  1 VIEW COMPARISON:  December 03, 2020 FINDINGS: No pneumothorax. A right PICC line is identified with the distal tip terminating in the SVC. No pulmonary nodules or masses. No focal infiltrates. The cardiomediastinal silhouette is unremarkable. IMPRESSION: The right PICC line is in good position. No acute abnormalities are identified. Electronically Signed   By: Dorise Bullion III M.D   On: 12/13/2020 13:03        Scheduled Meds:  allopurinol  300 mg Oral Daily   apixaban  5 mg Oral BID   Chlorhexidine Gluconate Cloth  6 each Topical Daily   feeding supplement  237 mL Oral BID BM   food thickener  1 packet Oral TID WC, HS, 0200   predniSONE  60 mg Oral Q breakfast   Continuous Infusions:  sodium chloride 75 mL/hr at 12/13/20 2006     LOS: 13 days    Time spent: 40 min    Georgette Shell, MD  12/14/2020, 2:08 PM

## 2020-12-14 NOTE — TOC Progression Note (Addendum)
Transition of Care [facility]) - Progression Note    Patient Details  Name: Cody Hurley MRN: [id number] Date of Birth: 01-13-1926  [facility] CM/SW Contact  Leeroy Cha, RN Phone Number: 12/14/2020, 10:09 AM  Clinical Narrative:    Tawni Pummel in [place]  Message left or the admissions coordinator to call back.  Information from chart for admission consideration faxed to [facility]. [facility] at [facility]  cannot take if active chemo ongoing.  Will check with md. Per Dr. Rodena Piety will not need chemo for at least three weeks or more.  This relayed to [place] at [facility].  Expected Discharge Plan: [place] Barriers to Discharge: Continued Medical Work up  Expected Discharge Plan and Services Expected Discharge Plan: [place] In-house Referral: Clinical Social Work     Living arrangements for the past 2 months: Single Family Home                                       Social Determinants of Health (SDOH) Interventions    Readmission Risk Interventions No flowsheet data found.

## 2020-12-14 NOTE — Progress Notes (Addendum)
IP PROGRESS NOTE  Subjective:   Mr. Gulino has no new complaint.  He is sitting up and eating breakfast at the time of visit.  Objective: Vital signs in last 24 hours: Blood pressure 120/74, pulse 71, temperature 97.6 F (36.4 C), temperature source Oral, resp. rate 16, height 5' 7" (1.702 m), weight 61.4 kg, SpO2 93 %.  Intake/Output from previous day: 07/17 0701 - 07/18 0700 In: 4464.9 [P.O.:240; I.V.:4224.9] Out: 1450 [Urine:1400; Stool:50]  Physical Exam:  HEENT: No thrush Lungs:  clear anteriorly, no respiratory distress Cardiac: Regular rate and rhythm Abdomen: No hepatosplenomegaly, nontender, left lower quadrant colostomy Extremities: Trace pitting edema to lower leg bilaterally, left greater than right Neurologic: Alert, follows commands, oriented   Lab Results: Recent Labs    12/12/20 0541 12/13/20 0611  WBC 7.4 5.7  HGB 13.9 13.6  HCT 41.8 40.5  PLT 122* 92*    BMET Recent Labs    12/12/20 0541 12/13/20 0611  NA 135 136  K 4.4 3.4*  CL 100 100  CO2 20* 31  GLUCOSE 154* 154*  BUN 16 16  CREATININE 0.49* 0.40*  CALCIUM 9.5 8.9  Ionized calcium on 12/03/2020: 5.1  Lab Results  Component Value Date   CEA1 2.57 11/18/2020    Studies/Results: DG Chest 1 View  Result Date: 12/13/2020 CLINICAL DATA:  Shortness of breath. Patient with rectal cancer and prostate cancer. Increasing weakness. EXAM: CHEST  1 VIEW COMPARISON:  December 03, 2020 FINDINGS: No pneumothorax. A right PICC line is identified with the distal tip terminating in the SVC. No pulmonary nodules or masses. No focal infiltrates. The cardiomediastinal silhouette is unremarkable. IMPRESSION: The right PICC line is in good position. No acute abnormalities are identified. Electronically Signed   By: Dorise Bullion III M.D   On: 12/13/2020 13:03    Medications: I have reviewed the patient's current medications.  Assessment/Plan: Stage III rectal cancer,ypT4a,N2 status post a low anterior resection  in September 2017 Presenting November 2016 with a mass at 10 cm from the anus, clinical stage II by EUS-T3N0 Neoadjuvant Xeloda and radiation beginning 06/01/2015, completed 20 of 28 planned fractions, discontinued secondary to toxicity Elevated pretreatment CEA CT chest 04/10/2019-no evidence of metastatic disease, changes of interstitial lung disease, changes suggestive of cirrhosis CT chest 11/22/2020-scattered dependent groundglass airspace opacity and irregular interstitial opacity-nonspecific infectious or inflammatory, new bilateral adrenal nodules, extensive ill-defined soft tissue surrounding the right kidney suspicious for malignancy CT abdomen/pelvis 11/26/2020-bilateral adrenal masses, multinodular masslike soft tissue density throughout the right perinephric space and renal sinus, new left periaortic and retroperitoneal adenopathy, cirrhosis   Prostate cancer 1990 treated with a prostatectomy History of a TIA Carpal tunnel syndrome Palpable liver edge-ultrasound abdomen 04/10/2019-fatty infiltration of the liver, normal portal vein flow Left lung subsegmental pulmonary emboli 11/22/2020-apixaban Anorexia/weight loss COVID-19 infection June 2022 Admission 12/01/2020 with failure to thrive Elevated calcium-normal ionized calcium 12/03/2020 Retroperitoneal biopsy from 12/03/2020 consistent with large B-cell lymphoma CD20 positive, KI-67 95%, FISH panel pending Cycle 1 CVP-rituximab 12/10/2020  12.  Thrombocytopenia secondary to chemotherapy and lymphoma   Mr. Handshoe is now at day 4 following cycle 1 CVP-rituximab.  He appears stable.  No evidence for tumor lysis syndrome.  No acute toxicity from the systemic therapy regimen.  He has been encouraged to increase his diet and activity as tolerated.  His wife is at the bedside and anticipates that he will be discharged to SNF for rehab prior to going home.  Cycle 2 CVP-rituximab will be given  as an outpatient.  Recommendations: 1.  Continue  normal saline at 75 cc/h. 2.  Continue allopurinol. 3.  Prednisone daily for 4 days total 4.  Leave PICC in place at discharge 5.  Increase ambulation and diet as tolerated 6.  TOC to talk with the patient's wife regarding disposition plans.  Recommend SNF for rehab.   LOS: 13 days   Mikey Bussing, NP   12/14/2020, 8:09 AM Mr. Santacroce was interviewed and examined.  He appears stable.  His wife is at the bedside.  I discussed his current status and treatment plan with her.  She is unable to care for him at home in his current condition.  The plan is to discharge him to a skilled nursing facility for physical therapy.  Hopefully he will be able to return home within the next few weeks.  He is now at day 5 following cycle 1 CVP-rituximab.  The platelet count is mildly decreased.  We can check a nadir CBC at the skilled nursing facility next week.  The plan is to discharge him with a PICC in place.  Outpatient follow-up will be scheduled at the Cancer center.  I was present for greater than 50% of today's visit.  I performed medical decision making.

## 2020-12-15 ENCOUNTER — Other Ambulatory Visit: Payer: Self-pay | Admitting: *Deleted

## 2020-12-15 DIAGNOSIS — R1900 Intra-abdominal and pelvic swelling, mass and lump, unspecified site: Secondary | ICD-10-CM | POA: Diagnosis not present

## 2020-12-15 DIAGNOSIS — C20 Malignant neoplasm of rectum: Secondary | ICD-10-CM

## 2020-12-15 LAB — GLUCOSE, CAPILLARY
Glucose-Capillary: 136 mg/dL — ABNORMAL HIGH (ref 70–99)
Glucose-Capillary: 202 mg/dL — ABNORMAL HIGH (ref 70–99)
Glucose-Capillary: 221 mg/dL — ABNORMAL HIGH (ref 70–99)
Glucose-Capillary: 222 mg/dL — ABNORMAL HIGH (ref 70–99)

## 2020-12-15 LAB — COMPREHENSIVE METABOLIC PANEL
ALT: 56 U/L — ABNORMAL HIGH (ref 0–44)
AST: 82 U/L — ABNORMAL HIGH (ref 15–41)
Albumin: 2.4 g/dL — ABNORMAL LOW (ref 3.5–5.0)
Alkaline Phosphatase: 69 U/L (ref 38–126)
Anion gap: 4 — ABNORMAL LOW (ref 5–15)
BUN: 13 mg/dL (ref 8–23)
CO2: 34 mmol/L — ABNORMAL HIGH (ref 22–32)
Calcium: 9 mg/dL (ref 8.9–10.3)
Chloride: 100 mmol/L (ref 98–111)
Creatinine, Ser: <0.3 mg/dL — ABNORMAL LOW (ref 0.61–1.24)
Glucose, Bld: 143 mg/dL — ABNORMAL HIGH (ref 70–99)
Potassium: 3.9 mmol/L (ref 3.5–5.1)
Sodium: 138 mmol/L (ref 135–145)
Total Bilirubin: 1.4 mg/dL — ABNORMAL HIGH (ref 0.3–1.2)
Total Protein: 5.5 g/dL — ABNORMAL LOW (ref 6.5–8.1)

## 2020-12-15 LAB — CBC WITH DIFFERENTIAL/PLATELET
Abs Immature Granulocytes: 0.02 10*3/uL (ref 0.00–0.07)
Basophils Absolute: 0 10*3/uL (ref 0.0–0.1)
Basophils Relative: 0 %
Eosinophils Absolute: 0 10*3/uL (ref 0.0–0.5)
Eosinophils Relative: 0 %
HCT: 41 % (ref 39.0–52.0)
Hemoglobin: 13.7 g/dL (ref 13.0–17.0)
Immature Granulocytes: 0 %
Lymphocytes Relative: 4 %
Lymphs Abs: 0.2 10*3/uL — ABNORMAL LOW (ref 0.7–4.0)
MCH: 34.5 pg — ABNORMAL HIGH (ref 26.0–34.0)
MCHC: 33.4 g/dL (ref 30.0–36.0)
MCV: 103.3 fL — ABNORMAL HIGH (ref 80.0–100.0)
Monocytes Absolute: 0.2 10*3/uL (ref 0.1–1.0)
Monocytes Relative: 4 %
Neutro Abs: 4.2 10*3/uL (ref 1.7–7.7)
Neutrophils Relative %: 92 %
Platelets: 75 10*3/uL — ABNORMAL LOW (ref 150–400)
RBC: 3.97 MIL/uL — ABNORMAL LOW (ref 4.22–5.81)
RDW: 16.7 % — ABNORMAL HIGH (ref 11.5–15.5)
WBC: 4.6 10*3/uL (ref 4.0–10.5)
nRBC: 0 % (ref 0.0–0.2)

## 2020-12-15 LAB — MAGNESIUM: Magnesium: 2.4 mg/dL (ref 1.7–2.4)

## 2020-12-15 LAB — LACTATE DEHYDROGENASE: LDH: 484 U/L — ABNORMAL HIGH (ref 98–192)

## 2020-12-15 NOTE — Progress Notes (Signed)
Being discharged from hospital to facility in [place], [place] today. Will need facility to check CBC on 7/22 and 7/26 and fax labs to office. Per [name]: Needs lab/flush/OV and chemo w/CVP and Rituxan on 12/31/20. Scheduling message sent.

## 2020-12-15 NOTE — Progress Notes (Addendum)
IP PROGRESS NOTE  Subjective:   Mr. Meter has no new complaint.  He is sitting up in the recliner.  He tells me that he was able to walk in the hallway with a walker yesterday and had a pretty good day.  Objective: Vital signs in last 24 hours: Blood pressure 127/78, pulse 76, temperature 97.7 F (36.5 C), temperature source Oral, resp. rate 17, height 5' 7"  (1.702 m), weight 61.4 kg, SpO2 95 %.  Intake/Output from previous day: 07/18 0701 - 07/19 0700 In: 2002.3 [P.O.:460; I.V.:1542.3] Out: 1200 [Urine:1200]  Physical Exam:  HEENT: No thrush Lungs:  clear anteriorly, no respiratory distress Cardiac: Regular rate and rhythm Abdomen: No hepatosplenomegaly, nontender, left lower quadrant colostomy Extremities: Trace pitting edema to lower leg bilaterally, left greater than right Neurologic: Alert, follows commands, oriented   Lab Results: Recent Labs    12/14/20 0941 12/15/20 0348  WBC 5.2 4.6  HGB 13.9 13.7  HCT 41.1 41.0  PLT 82* 75*    BMET Recent Labs    12/14/20 0941 12/15/20 0348  NA 139 138  K 3.4* 3.9  CL 102 100  CO2 32 34*  GLUCOSE 165* 143*  BUN 14 13  CREATININE 0.41* <0.30*  CALCIUM 8.9 9.0  Ionized calcium on 12/03/2020: 5.1  Lab Results  Component Value Date   CEA1 2.57 11/18/2020    Studies/Results: DG Chest 1 View  Result Date: 12/13/2020 CLINICAL DATA:  Shortness of breath. Patient with rectal cancer and prostate cancer. Increasing weakness. EXAM: CHEST  1 VIEW COMPARISON:  December 03, 2020 FINDINGS: No pneumothorax. A right PICC line is identified with the distal tip terminating in the SVC. No pulmonary nodules or masses. No focal infiltrates. The cardiomediastinal silhouette is unremarkable. IMPRESSION: The right PICC line is in good position. No acute abnormalities are identified. Electronically Signed   By: Dorise Bullion III M.D   On: 12/13/2020 13:03    Medications: I have reviewed the patient's current  medications.  Assessment/Plan: Stage III rectal cancer,ypT4a,N2 status post a low anterior resection in September 2017 Presenting November 2016 with a mass at 10 cm from the anus, clinical stage II by EUS-T3N0 Neoadjuvant Xeloda and radiation beginning 06/01/2015, completed 20 of 28 planned fractions, discontinued secondary to toxicity Elevated pretreatment CEA CT chest 04/10/2019-no evidence of metastatic disease, changes of interstitial lung disease, changes suggestive of cirrhosis CT chest 11/22/2020-scattered dependent groundglass airspace opacity and irregular interstitial opacity-nonspecific infectious or inflammatory, new bilateral adrenal nodules, extensive ill-defined soft tissue surrounding the right kidney suspicious for malignancy CT abdomen/pelvis 11/26/2020-bilateral adrenal masses, multinodular masslike soft tissue density throughout the right perinephric space and renal sinus, new left periaortic and retroperitoneal adenopathy, cirrhosis   Prostate cancer 1990 treated with a prostatectomy History of a TIA Carpal tunnel syndrome Palpable liver edge-ultrasound abdomen 04/10/2019-fatty infiltration of the liver, normal portal vein flow Left lung subsegmental pulmonary emboli 11/22/2020-apixaban Anorexia/weight loss COVID-19 infection June 2022 Admission 12/01/2020 with failure to thrive Elevated calcium-normal ionized calcium 12/03/2020 Retroperitoneal biopsy from 12/03/2020 consistent with large B-cell lymphoma CD20 positive, KI-67 95%, FISH panel pending Cycle 1 CVP-rituximab 12/10/2020  12.  Thrombocytopenia secondary to chemotherapy and lymphoma   Mr. Vuolo is now at day 5 following cycle 1 CVP-rituximab.  He appears stable.  No evidence for tumor lysis syndrome.  No acute toxicity from the systemic therapy regimen.  Labs from this morning do show a stable WBC and hemoglobin.  Platelets are slowly drifting down to 75,000 this morning.  Plan to  monitor for now.  He has been encouraged  to increase his diet and activity as tolerated.  His wife is at the bedside and anticipates that he will be discharged to SNF for rehab prior to going home.  The patient will need a CBC with differential on Friday, 12/18/2020 and a CBC with differential, CMET, LDH on either Monday 12/21/2020 or Tuesday 12/22/2020.  These labs are to be drawn at the skilled nursing facility and faxed to our office.  Fax number is [phone].  Labs may be drawn through his PICC line.  These instructions were written on his discharge instructions for his AVS.  Cycle 2 CVP-rituximab will be given as an outpatient on 12/31/2020.  Our office is in the process of scheduling this.  Recommendations: 1.  Continue normal saline at 75 cc/h until discharged to SNF. 2.  Continue allopurinol through 12/17/2020 3.  Leave PICC in place at discharge 5.  Increase ambulation and diet as tolerated 6.  Awaiting bed placement at SNF for rehab.  He will need a CBC with differential on 7/22 and a CBC with differential, CMET LDH on 7/25 or 7/26.  Labs to be drawn at [facility] and faxed to our office at [phone]. 7.  We will arrange lab, visit, and second cycle of chemotherapy for 8/4.  Our office is currently in the process of scheduling these appointments.   LOS: 14 days   Mikey Bussing, NP   12/15/2020, 7:46 AM Mr. Lupo was interviewed and examined.  His wife was at the bedside when I saw him this morning.  His overall status appears stable.  He was able to ambulate yesterday.  He is now at day 5 following cycle 1 CVP-rituximab.  He has some mild thrombocytopenia.  I will recommend holding apixaban if the platelet count returns less than 50,000.  Mr. Rennels will be scheduled for outpatient follow-up at the Cancer center.  He will be discharged to a skilled nursing facility.  We will request labs be obtained at the skilled nursing facility later this week and again next week.  I was present for greater than 50% of today's visit.  I performed  medical decision making.

## 2020-12-15 NOTE — Progress Notes (Signed)
PROGRESS NOTE    Cody Hurley  ERX:[id number] DOB: 06-Aug-1925 DOA: 12/01/2020 PCP: [name], MD   Brief Narrative: 87 year old male with a history of rectal cancer and prostate cancer mated with increasing weakness decreased appetite and weight loss of about 20 to 30 pound in 1 month.  Patient had COVID infection 2 weeks prior to admission to hospital at that time he was treated with antiviral agents and steroids.  CT of the chest at that time showed small pulmonary embolism he was placed on Eliquis.  CT scan also showed retroperitoneal masses concerning for metastatic cancer.  Assessment & Plan:   Principal Problem:   Retroperitoneal mass Active Problems:   Pulmonary embolism (HCC)   FTT (failure to thrive) in adult   Hypercalcemia   History of rectal cancer   History of prostate cancer   COVID-19 virus infection: Recent COVID-19 infection status posttreatment   Acidosis   Lactic acidosis   Non-Hodgkin lymphoma (HCC)   Pressure injury of skin    #1 retroperitoneal renal mass concerning for metastatic cancer-status post biopsy consistent with aggressive B-cell lymphoma.  Patient received the first chemotherapy on 12/11/2020.   Will continue normal saline at 75 cc an hour until discharge Mild thrombocytopenia noted.  Hold Eliquis if platelets less than 50,000. PT recommending SNF.  Patient lives with his elderly wife  54 years old. TOC looking at [facility] in [place]. DO NOT DC PICC LINE ON DISCHARGE.HE IS GOING TO GET CHEMO IN 2 TO 3 WEEKS.  #2 recent PE on Eliquis.  #3 history of rectal cancer and prostate cancer followed by [name].  #4 hypercalcemia resolved.  #5 recent COVID 19 he received treatment with Decadron and remdesivir.  Currently he is on room air.  #6 lactic acidosis likely secondary to malignancy.  ID was consulted.  Antibiotics stopped.  MRSA PCR negative.   CT abdomen and pelvis no acute abnormalities.   Anion gap has  normalized. He was on bicarb which has been stopped. Repeat chest x-ray 12/13/2020-no acute findings.  #7 hypoglycemia-resolved.  He is on prednisone and he received Decadron 10 mg per oncology prior to chemotherapy which has increased his blood sugars.  Cover with SSI. CBG (last 3)  Recent Labs    12/14/20 2128 12/15/20 0837 12/15/20 1431  GLUCAP 203* 136* 202*     #8 left heel pain some erythema noted on the left heel keep both feet elevated will order Prevalon boots  Nutrition Problem: Inadequate oral intake Etiology: acute illness Signs/Symptoms: per patient/family report    Interventions: Ensure Enlive (each supplement provides 350kcal and 20 grams of protein), MVI, Magic cup  Estimated body mass index is 21.21 kg/m as calculated from the following:   Height as of this encounter: 5\' 7"  (1.702 m).   Weight as of this encounter: 61.4 kg.  DVT prophylaxis: Eliquis  code Status: DNR Family Communication: Discussed with wife at bedside disposition Plan:  Status is: Inpatient  Remains inpatient appropriate because:IV treatments appropriate due to intensity of illness or inability to take PO  Dispo: The patient is from: Home              Anticipated d/c is to: Home              Patient currently is not medically stable to d/c.   Difficult to place patient No   Consultants:  Oncology palliative care interventional radiology infectious disease  Procedures: CT-guided needle biopsy of the right retroperitoneal  mass 12/03/2020 Chemotherapy 12/11/2020  Antimicrobials: None  Subjective: Wife by the bedside patient is awake and alert ate a good breakfast  Slept throughout the night  objective: Vitals:   12/14/20 1551 12/14/20 1952 12/15/20 0523 12/15/20 1432  BP: 111/72 112/74 127/78 114/72  Pulse: 78 81 76 (!) 53  Resp: 16 19 17 18   Temp: 97.6 F (36.4 C) 97.9 F (36.6 C) 97.7 F (36.5 C) 97.8 F (36.6 C)  TempSrc: Oral Oral Oral Oral  SpO2: 97% 95% 95% 95%   Weight:      Height:        Intake/Output Summary (Last 24 hours) at 12/15/2020 1500 Last data filed at 12/15/2020 1232 Gross per 24 hour  Intake 1209.7 ml  Output 1200 ml  Net 9.7 ml    Filed Weights   12/04/20 0406  Weight: 61.4 kg    Examination:  General exam: Appears weaker Respiratory system: Scattered rhonchi o auscultation. Respiratory effort normal. Cardiovascular system: S1 & S2 heard, RRR. No JVD, murmurs, rubs, gallops or clicks. No pedal edema. Gastrointestinal system: Abdomen is nondistended, soft and nontender. No organomegaly or masses felt. Normal bowel sounds heard.  Colostomy in place with loose stools Central nervous system: Alert and oriented. No focal neurological deficits. Extremities: 1+ bilateral pitting edema, erythema to the left heel noted skin: No rashes, lesions or ulcers Psychiatry: Judgement and insight appear normal. Mood & affect appropriate.     Data Reviewed: I have personally reviewed following labs and imaging studies  CBC: Recent Labs  Lab 12/09/20 0516 12/10/20 0527 12/11/20 1015 12/12/20 0541 12/13/20 0611 12/14/20 0941 12/15/20 0348  WBC 5.5   < > 5.8 7.4 5.7 5.2 4.6  NEUTROABS 3.8  --   --  6.8 5.2 4.7 4.2  HGB 13.6   < > 12.9* 13.9 13.6 13.9 13.7  HCT 41.2   < > 38.8* 41.8 40.5 41.1 41.0  MCV 105.1*   < > 105.4* 104.8* 102.5* 104.1* 103.3*  PLT 165   < > 133* 122* 92* 82* 75*   < > = values in this interval not displayed.    Basic Metabolic Panel: Recent Labs  Lab 12/09/20 0516 12/10/20 0527 12/11/20 1015 12/12/20 0541 12/13/20 0611 12/14/20 0941 12/15/20 0348  NA 137   < > 136 135 136 139 138  K 4.2   < > 3.5 4.4 3.4* 3.4* 3.9  CL 103   < > 105 100 100 102 100  CO2 13*   < > 14* 20* 31 32 34*  GLUCOSE 61*   < > 89 154* 154* 165* 143*  BUN 11   < > 12 16 16 14 13   CREATININE 0.56*   < > 0.32* 0.49* 0.40* 0.41* <0.30*  CALCIUM 10.1   < > 8.5* 9.5 8.9 8.9 9.0  MG 2.0  --   --   --   --  2.3 2.4  PHOS 2.6   --   --   --   --   --   --    < > = values in this interval not displayed.    GFR: CrCl cannot be calculated (This lab value cannot be used to calculate CrCl because it is not a number: <0.30). Liver Function Tests: Recent Labs  Lab 12/11/20 1015 12/12/20 0541 12/13/20 0611 12/14/20 0941 12/15/20 0348  AST 57* 62* 63* 74* 82*  ALT 30 32 35 46* 56*  ALKPHOS 56 66 59 69 69  BILITOT 1.0 0.8 1.0  1.2 1.4*  PROT 5.1* 5.7* 5.4* 5.5* 5.5*  ALBUMIN 2.3* 2.6* 2.4* 2.5* 2.4*    No results for input(s): LIPASE, AMYLASE in the last 168 hours. No results for input(s): AMMONIA in the last 168 hours. Coagulation Profile: No results for input(s): INR, PROTIME in the last 168 hours. Cardiac Enzymes: No results for input(s): CKTOTAL, CKMB, CKMBINDEX, TROPONINI in the last 168 hours. BNP (last 3 results) No results for input(s): PROBNP in the last 8760 hours. HbA1C: No results for input(s): HGBA1C in the last 72 hours. CBG: Recent Labs  Lab 12/14/20 1211 12/14/20 1721 12/14/20 2128 12/15/20 0837 12/15/20 1431  GLUCAP 180* 220* 203* 136* 202*    Lipid Profile: No results for input(s): CHOL, HDL, LDLCALC, TRIG, CHOLHDL, LDLDIRECT in the last 72 hours. Thyroid Function Tests: No results for input(s): TSH, T4TOTAL, FREET4, T3FREE, THYROIDAB in the last 72 hours. Anemia Panel: No results for input(s): VITAMINB12, FOLATE, FERRITIN, TIBC, IRON, RETICCTPCT in the last 72 hours. Sepsis Labs: No results for input(s): PROCALCITON, LATICACIDVEN in the last 168 hours.   Recent Results (from the past 240 hour(s))  Culture, blood (Routine X 2) w Reflex to ID Panel     Status: None   Collection Time: 12/06/20  1:42 PM   Specimen: BLOOD  Result Value Ref Range Status   Specimen Description   Final    BLOOD RIGHT ANTECUBITAL Performed at [facility] [address], [place], [place] [address]    Special Requests   Final    BOTTLES DRAWN AEROBIC ONLY Blood Culture  adequate volume Performed at [facility] [address], [place], [place] [address]    Culture   Final    NO GROWTH 5 DAYS Performed at [facility], [place] [address] [place], [place] [address]    Report Status 12/11/2020 FINAL  Final  Culture, blood (Routine X 2) w Reflex to ID Panel     Status: None   Collection Time: 12/06/20  4:11 PM   Specimen: BLOOD  Result Value Ref Range Status   Specimen Description   Final    BLOOD RIGHT ANTECUBITAL Performed at [facility] [address], [place], [place] [address]    Special Requests   Final    BOTTLES DRAWN AEROBIC AND ANAEROBIC Blood Culture results may not be optimal due to an inadequate volume of blood received in culture bottles Performed at [facility] [address] [place], [place] [address]    Culture   Final    NO GROWTH 5 DAYS Performed at [facility], [place] [address] [place], [place] [address]    Report Status 12/11/2020 FINAL  Final  Culture, Urine     Status: None   Collection Time: 12/07/20 10:17 AM   Specimen: Urine, Clean Catch  Result Value Ref Range Status   Specimen Description   Final    URINE, CLEAN CATCH Performed at [facility], [place] [address], [place], [place] [address]    Special Requests   Final    NONE Performed at [facility], [place] [address] [place], [place] [address]    Culture   Final    NO GROWTH Performed at [facility], [place] [address] [place], [place] [address]    Report Status 12/08/2020 FINAL  Final  MRSA Next Gen by PCR, Nasal     Status: None   Collection Time: 12/07/20 11:00 AM  Specimen: Nasal Mucosa; Nasal Swab  Result Value Ref Range Status   MRSA by PCR Next Gen NOT DETECTED NOT DETECTED Final    Comment: (NOTE) The GeneXpert MRSA Assay (FDA approved for NASAL specimens only), is one component of a comprehensive MRSA colonization surveillance program. It  is not intended to diagnose MRSA infection nor to guide or monitor treatment for MRSA infections. Test performance is not FDA approved in patients less than 68 years old. Performed at [facility], [place] [address] [place], [place] [address]           Radiology Studies: No results found.      Scheduled Meds:  allopurinol  300 mg Oral Daily   apixaban  5 mg Oral BID   Chlorhexidine Gluconate Cloth  6 each Topical Daily   feeding supplement  237 mL Oral BID BM   food thickener  1 packet Oral TID WC, HS, 0200   Continuous Infusions:  sodium chloride 75 mL/hr at 12/15/20 0341     LOS: 14 days    Time spent: 40 min    Georgette Shell, MD  12/15/2020, 3:00 PM

## 2020-12-15 NOTE — Discharge Instructions (Addendum)
Oncology Discharge Instructions:  Draw a CBC with differential, CMP, and LDH on either Monday, 12/21/2020 or Tuesday, 12/22/2020 and fax results to [name] at [phone].  Diagnosis for labs: High-grade B-cell lymphoma.  Labs may be drawn from the patient's PICC line.  PICC line will need to be flushed 3 times a week and dressing changed weekly.

## 2020-12-16 DIAGNOSIS — C8513 Unspecified B-cell lymphoma, intra-abdominal lymph nodes: Secondary | ICD-10-CM | POA: Diagnosis not present

## 2020-12-16 DIAGNOSIS — R1900 Intra-abdominal and pelvic swelling, mass and lump, unspecified site: Secondary | ICD-10-CM | POA: Diagnosis not present

## 2020-12-16 DIAGNOSIS — L89156 Pressure-induced deep tissue damage of sacral region: Secondary | ICD-10-CM | POA: Diagnosis not present

## 2020-12-16 DIAGNOSIS — I2699 Other pulmonary embolism without acute cor pulmonale: Secondary | ICD-10-CM | POA: Diagnosis not present

## 2020-12-16 DIAGNOSIS — R627 Adult failure to thrive: Secondary | ICD-10-CM | POA: Diagnosis not present

## 2020-12-16 LAB — GLUCOSE, CAPILLARY
Glucose-Capillary: 101 mg/dL — ABNORMAL HIGH (ref 70–99)
Glucose-Capillary: 124 mg/dL — ABNORMAL HIGH (ref 70–99)
Glucose-Capillary: 147 mg/dL — ABNORMAL HIGH (ref 70–99)
Glucose-Capillary: 210 mg/dL — ABNORMAL HIGH (ref 70–99)

## 2020-12-16 LAB — CBC WITH DIFFERENTIAL/PLATELET
Abs Immature Granulocytes: 0.03 10*3/uL (ref 0.00–0.07)
Basophils Absolute: 0 10*3/uL (ref 0.0–0.1)
Basophils Relative: 0 %
Eosinophils Absolute: 0.1 10*3/uL (ref 0.0–0.5)
Eosinophils Relative: 1 %
HCT: 39.5 % (ref 39.0–52.0)
Hemoglobin: 13.3 g/dL (ref 13.0–17.0)
Immature Granulocytes: 1 %
Lymphocytes Relative: 7 %
Lymphs Abs: 0.3 10*3/uL — ABNORMAL LOW (ref 0.7–4.0)
MCH: 35.3 pg — ABNORMAL HIGH (ref 26.0–34.0)
MCHC: 33.7 g/dL (ref 30.0–36.0)
MCV: 104.8 fL — ABNORMAL HIGH (ref 80.0–100.0)
Monocytes Absolute: 0.2 10*3/uL (ref 0.1–1.0)
Monocytes Relative: 3 %
Neutro Abs: 4.5 10*3/uL (ref 1.7–7.7)
Neutrophils Relative %: 88 %
Platelets: 81 10*3/uL — ABNORMAL LOW (ref 150–400)
RBC: 3.77 MIL/uL — ABNORMAL LOW (ref 4.22–5.81)
RDW: 16.9 % — ABNORMAL HIGH (ref 11.5–15.5)
WBC: 5.1 10*3/uL (ref 4.0–10.5)
nRBC: 0 % (ref 0.0–0.2)

## 2020-12-16 NOTE — TOC Progression Note (Signed)
Transition of Care [facility]) - Progression Note    Patient Details  Name: Cody Hurley MRN: [id number] Date of Birth: 03/04/1926  Transition of [facility]) CM/SW Contact  Purcell Mouton, RN Phone Number: 12/16/2020, 3:37 PM  Clinical Narrative:    There are no beds available at [facility] in [place]. Will try to contact other SNF in [place].    Expected Discharge Plan: [place] Barriers to Discharge: Continued Medical Work up  Expected Discharge Plan and Services Expected Discharge Plan: [place] In-house Referral: Clinical Social Work     Living arrangements for the past 2 months: Single Family Home                                       Social Determinants of Health (SDOH) Interventions    Readmission Risk Interventions No flowsheet data found.

## 2020-12-16 NOTE — Progress Notes (Signed)
IP PROGRESS NOTE  Subjective:   Cody Hurley was able to ambulate yesterday.  He continues to have back pain.  No new complaint.  Objective: Vital signs in last 24 hours: Blood pressure 116/80, pulse 72, temperature 97.9 F (36.6 C), temperature source Oral, resp. rate 14, height 5' 7"  (1.702 m), weight 135 lb 6.4 oz (61.4 kg), SpO2 96 %.  Intake/Output from previous day: 07/19 0701 - 07/20 0700 In: 1042.2 [P.O.:360; I.V.:682.2] Out: 950 [Urine:950]  Physical Exam:  HEENT: No thrush Lungs: Inspiratory rhonchi at the left upper anterior chest, no respiratory distress Cardiac: Regular rate and rhythm Abdomen: No hepatosplenomegaly, nontender, left lower quadrant colostomy Extremities: Trace pitting edema to lower leg bilaterally Neurologic: Alert, follows commands, oriented   Lab Results: Recent Labs    12/14/20 0941 12/15/20 0348  WBC 5.2 4.6  HGB 13.9 13.7  HCT 41.1 41.0  PLT 82* 75*    BMET Recent Labs    12/14/20 0941 12/15/20 0348  NA 139 138  K 3.4* 3.9  CL 102 100  CO2 32 34*  GLUCOSE 165* 143*  BUN 14 13  CREATININE 0.41* <0.30*  CALCIUM 8.9 9.0  Ionized calcium on 12/03/2020: 5.1  Lab Results  Component Value Date   CEA1 2.57 11/18/2020    Studies/Results: No results found.  Medications: I have reviewed the patient's current medications.  Assessment/Plan: Stage III rectal cancer,ypT4a,N2 status post a low anterior resection in September 2017 Presenting November 2016 with a mass at 10 cm from the anus, clinical stage II by [id number] Neoadjuvant Xeloda and radiation beginning 06/01/2015, completed 20 of 28 planned fractions, discontinued secondary to toxicity Elevated pretreatment CEA CT chest 04/10/2019-no evidence of metastatic disease, changes of interstitial lung disease, changes suggestive of cirrhosis CT chest 11/22/2020-scattered dependent groundglass airspace opacity and irregular interstitial opacity-nonspecific infectious or inflammatory, new  bilateral adrenal nodules, extensive ill-defined soft tissue surrounding the right kidney suspicious for malignancy CT abdomen/pelvis 11/26/2020-bilateral adrenal masses, multinodular masslike soft tissue density throughout the right perinephric space and renal sinus, new left periaortic and retroperitoneal adenopathy, cirrhosis   Prostate cancer 1990 treated with a prostatectomy History of a TIA Carpal tunnel syndrome Palpable liver edge-ultrasound abdomen 04/10/2019-fatty infiltration of the liver, normal portal vein flow Left lung subsegmental pulmonary emboli 11/22/2020-apixaban Anorexia/weight loss COVID-19 infection June 2022 Admission 12/01/2020 with failure to thrive Elevated calcium-normal ionized calcium 12/03/2020 Retroperitoneal biopsy from 12/03/2020 consistent with large B-cell lymphoma CD20 positive, KI-67 95%, FISH panel pending Cycle 1 CVP-rituximab 12/10/2020  12.  Thrombocytopenia secondary to chemotherapy and lymphoma   Cody Hurley is now at day 6 following cycle 1 CVP-rituximab.  He tolerated the chemotherapy without significant acute toxicity.  The platelet count is moderately decreased.  We will follow-up on the platelet count from this morning.  We will hold the apixaban for a platelet count of less than 50,000.  He has been encouraged to increase his diet and activity as tolerated.    The patient will need a CBC with differential on Friday, 12/18/2020 and a CBC with differential, CMET, LDH on either Monday 12/21/2020 or Tuesday 12/22/2020.  These labs are to be drawn at the skilled nursing facility and faxed to our office.  Fax number is [phone].  Labs may be drawn through his PICC line.  These instructions were written on his discharge instructions for his AVS.  Cycle 2 CVP-rituximab will be given as an outpatient on 12/31/2020.  Our office is in the process of scheduling this.  His friend/caretaker  was at the bedside when I saw him this morning.  She reports the plan remains  for him to be discharged to a skilled nursing facility in [place].  Recommendations: 1.  Continue normal saline at 75 cc/h until discharged to SNF. 2.  Continue allopurinol through 12/17/2020 3.  Leave PICC in place at discharge 5.  Increase ambulation and diet as tolerated 6.  Awaiting bed placement at SNF for rehab.  He will need a CBC with differential on 7/22 and a CBC with differential, CMET LDH on 7/25 or 7/26.  Labs to be drawn at [facility] and faxed to our office at [phone]. 7.  We will arrange lab, visit, and second cycle of chemotherapy for 8/4.  Our office is currently in the process of scheduling these appointments.   LOS: 15 days   Betsy Coder, MD   12/16/2020, 9:01 AM

## 2020-12-16 NOTE — Progress Notes (Signed)
PROGRESS NOTE  Cody Hurley XBJ:[id number] DOB: 07-31-1925 DOA: 12/01/2020 PCP: [name], MD  HPI/Recap of past 100 hours: 87 year old male with past medical history of rectal cancer and prostate cancer presented on 7/5 with decreased appetite and weight loss of 20 to 30 pounds and had recent COVID infection 2 weeks prior to coming in, treated with antiviral agents and steroids.  Found to have small pulmonary embolus as well as retroperitoneal masses concerning for metastatic cancer.  Admitted to the hospitalist service.  Oncology consulted.  Biopsy noted aggressive B-cell lymphoma.  Oncology gave patient chemo session x1 on 7/15.  Seen by PT recommended skilled nursing.  [place] from [place] also trying to arrange skilled nursing pain management.  Assessment/Plan: Active Problems:   Pulmonary embolism ([place]): On Eliquis   FTT (failure to thrive) in adult:Nutrition Status: Nutrition Problem: Inadequate oral intake Etiology: acute illness Signs/Symptoms: per patient/family report Interventions: Ensure Enlive (each supplement provides 350kcal and 20 grams of protein), MVI, Magic cup Seen by nutrition    Hypercalcemia   History of rectal cancer   History of prostate cancer   COVID-19 virus infection: Recent COVID-19 infection status posttreatment.  Stable.    Non-Hodgkin lymphoma [facility]): Status post 1 round of chemotherapy.  Status post PICC line placement.  No further chemo for the next 2 to 3 weeks until after skilled nursing    Pressure injury of skin: Present on admission Pressure Injury 12/13/20 Sacrum Stage 1 -  Intact skin with non-blanchable redness of a localized area usually over a bony prominence. (Active)  12/13/20 2200  Location: Sacrum  Location Orientation:   Staging: Stage 1 -  Intact skin with non-blanchable redness of a localized area usually over a bony prominence.  Wound Description (Comments):   Present on Admission:     Code Status: DNR  Family  Communication: Wife at bedside  Disposition Plan: Skilled nursing if approved   Consultants: Oncology Infectious disease  Procedures: Chemotherapy x1 7/15 CT-guided needle biopsy of right retroperitoneal mass 7/7 PICC line placement  Antimicrobials: Brief course of broad-spectrum antibiotics discontinued  DVT prophylaxis: Eliquis  Level of care: Med-Surg   Objective: Vitals:   12/16/20 1227 12/16/20 1251  BP:  109/61  Pulse:  75  Resp:  19  Temp:  98 F (36.7 C)  SpO2: 94% 99%    Intake/Output Summary (Last 24 hours) at 12/16/2020 1818 Last data filed at 12/16/2020 1300 Gross per 24 hour  Intake 240 ml  Output 950 ml  Net -710 ml   Filed Weights   12/04/20 0406  Weight: 61.4 kg   Body mass index is 21.21 kg/m.  Exam:  General: Resting comfortably, no acute distress Cardiovascular: Regular rate and rhythm, S1-S2 Respiratory: Clear to auscultation bilaterally Abdomen: Soft, nontender, nondistended, positive bowel sounds Musculoskeletal: No clubbing or cyanosis or edema Skin: Sacral decubitus ulcer Psychiatry: Appropriate, no acute distress Neurology: No focal deficits   Data Reviewed: CBC: Recent Labs  Lab 12/12/20 0541 12/13/20 0611 12/14/20 0941 12/15/20 0348 12/16/20 1000  WBC 7.4 5.7 5.2 4.6 5.1  NEUTROABS 6.8 5.2 4.7 4.2 4.5  HGB 13.9 13.6 13.9 13.7 13.3  HCT 41.8 40.5 41.1 41.0 39.5  MCV 104.8* 102.5* 104.1* 103.3* 104.8*  PLT 122* 92* 82* 75* 81*   Basic Metabolic Panel: Recent Labs  Lab 12/11/20 1015 12/12/20 0541 12/13/20 0611 12/14/20 0941 12/15/20 0348  NA 136 135 136 139 138  K 3.5 4.4 3.4* 3.4* 3.9  CL 105 100 100  102 100  CO2 14* 20* 31 32 34*  GLUCOSE 89 154* 154* 165* 143*  BUN 12 16 16 14 13   CREATININE 0.32* 0.49* 0.40* 0.41* <0.30*  CALCIUM 8.5* 9.5 8.9 8.9 9.0  MG  --   --   --  2.3 2.4   GFR: CrCl cannot be calculated (This lab value cannot be used to calculate CrCl because it is not a number:  <0.30). Liver Function Tests: Recent Labs  Lab 12/11/20 1015 12/12/20 0541 12/13/20 0611 12/14/20 0941 12/15/20 0348  AST 57* 62* 63* 74* 82*  ALT 30 32 35 46* 56*  ALKPHOS 56 66 59 69 69  BILITOT 1.0 0.8 1.0 1.2 1.4*  PROT 5.1* 5.7* 5.4* 5.5* 5.5*  ALBUMIN 2.3* 2.6* 2.4* 2.5* 2.4*   No results for input(s): LIPASE, AMYLASE in the last 168 hours. No results for input(s): AMMONIA in the last 168 hours. Coagulation Profile: No results for input(s): INR, PROTIME in the last 168 hours. Cardiac Enzymes: No results for input(s): CKTOTAL, CKMB, CKMBINDEX, TROPONINI in the last 168 hours. BNP (last 3 results) No results for input(s): PROBNP in the last 8760 hours. HbA1C: No results for input(s): HGBA1C in the last 72 hours. CBG: Recent Labs  Lab 12/15/20 1748 12/15/20 2123 12/16/20 0733 12/16/20 1131 12/16/20 1636  GLUCAP 221* 222* 101* 124* 147*   Lipid Profile: No results for input(s): CHOL, HDL, LDLCALC, TRIG, CHOLHDL, LDLDIRECT in the last 72 hours. Thyroid Function Tests: No results for input(s): TSH, T4TOTAL, FREET4, T3FREE, THYROIDAB in the last 72 hours. Anemia Panel: No results for input(s): VITAMINB12, FOLATE, FERRITIN, TIBC, IRON, RETICCTPCT in the last 72 hours. Urine analysis:    Component Value Date/Time   COLORURINE YELLOW 12/07/2020 1017   APPEARANCEUR CLEAR 12/07/2020 1017   LABSPEC 1.021 12/07/2020 1017   PHURINE 5.0 12/07/2020 1017   GLUCOSEU NEGATIVE 12/07/2020 1017   HGBUR NEGATIVE 12/07/2020 1017   BILIRUBINUR NEGATIVE 12/07/2020 1017   KETONESUR 20 (A) 12/07/2020 1017   PROTEINUR NEGATIVE 12/07/2020 1017   NITRITE NEGATIVE 12/07/2020 1017   LEUKOCYTESUR NEGATIVE 12/07/2020 1017   Sepsis Labs: @LABRCNTIP (procalcitonin:4,lacticidven:4)  ) Recent Results (from the past 240 hour(s))  Culture, Urine     Status: None   Collection Time: 12/07/20 10:17 AM   Specimen: Urine, Clean Catch  Result Value Ref Range Status   Specimen Description    Final    URINE, CLEAN CATCH Performed at [facility], [place] [address], [place], [place] [address]    Special Requests   Final    NONE Performed at [facility], [place] [address], [place], [place] [address]    Culture   Final    NO GROWTH Performed at [facility], [place] [address], [place], [place] [address]    Report Status 12/08/2020 FINAL  Final  MRSA Next Gen by PCR, Nasal     Status: None   Collection Time: 12/07/20 11:00 AM   Specimen: Nasal Mucosa; Nasal Swab  Result Value Ref Range Status   MRSA by PCR Next Gen NOT DETECTED NOT DETECTED Final    Comment: (NOTE) The GeneXpert MRSA Assay (FDA approved for NASAL specimens only), is one component of a comprehensive MRSA colonization surveillance program. It is not intended to diagnose MRSA infection nor to guide or monitor treatment for MRSA infections. Test performance is not FDA approved in patients less than 16 years old. Performed at [facility], [place] [address], [place], [place] [address]  Studies: No results found.  Scheduled Meds:  allopurinol  300 mg Oral Daily   apixaban  5 mg Oral BID   Chlorhexidine Gluconate Cloth  6 each Topical Daily   feeding supplement  237 mL Oral BID BM   food thickener  1 packet Oral TID WC, HS, 0200    Continuous Infusions:  sodium chloride 75 mL/hr at 12/16/20 1449     LOS: 15 days     Annita Brod, MD [facility]   12/16/2020, 6:18 PM

## 2020-12-16 NOTE — Progress Notes (Signed)
Physical Therapy Treatment Patient Details Name: Cody Hurley MRN: [id number] DOB: 1926/04/07 Today's Date: 12/16/2020    History of Present Illness Pt is 87 yo male who presented on 12/01/20 with increasing weakness and poor appetite.  Pt found to have retroperitoneal renal mass concerning for metastatic CA started on chemo 12/11/20.  Pt was dx with COVID ~2 weeks prior to admission and treated with antiviral and prednisone.  Also, with recent PE.  Pt with hx of colorectal and prostate CA with colostomy.    PT Comments    Pt making gradual progress.  He does require bed elevated to stand and min A to stabilize.  He did ambulate 110' but with min A , multimodal cues, and multiple standing rest breaks. Continue to recommend SNF at d/c due to unsafe to mobilize with family and below baseline level mobility.    Follow Up Recommendations  SNF     Equipment Recommendations  None recommended by PT    Recommendations for Other Services       Precautions / Restrictions Precautions Precautions: Fall Precaution Comments: colostomy    Mobility  Bed Mobility Overal bed mobility: Needs Assistance Bed Mobility: Supine to Sit     Supine to sit: Min assist     General bed mobility comments: Min A HHA to lift trunk and then hand on back to assist with scooting forward    Transfers Overall transfer level: Needs assistance Equipment used: Rolling walker (2 wheeled) Transfers: Sit to/from Stand Sit to Stand: Min assist;From elevated surface         General transfer comment: Attempted from lower surface but unable , required bed elevated and then able to stand with min A  Ambulation/Gait Ambulation/Gait assistance: Min assist Gait Distance (Feet): 110 Feet Assistive device: Rolling walker (2 wheeled) Gait Pattern/deviations: Step-to pattern;Decreased stride length;Trunk flexed Gait velocity: decr   General Gait Details: Pt requiring min A for stability.  He required multi modal cues  for posture and step length.  Pt self correcting RW proximity when needed   Stairs             Wheelchair Mobility    Modified Rankin (Stroke Patients Only)       Balance Overall balance assessment: Needs assistance Sitting-balance support: No upper extremity supported;Feet supported Sitting balance-Leahy Scale: Good     Standing balance support: Bilateral upper extremity supported Standing balance-Leahy Scale: Poor Standing balance comment: Using RW                            Cognition Arousal/Alertness: Awake/alert Behavior During Therapy: WFL for tasks assessed/performed Overall Cognitive Status: Within Functional Limits for tasks assessed                                 General Comments: AxO x 3 very pleasant but also very HOH      Exercises      General Comments General comments (skin integrity, edema, etc.): VSS; family members present and reporst plan for SNF at d/c      Pertinent Vitals/Pain Pain Assessment: No/denies pain    Home Living                      Prior Function            PT Goals (current goals can now be found in the care plan section)  Acute Rehab PT Goals Patient Stated Goal: get stronger and return home - family does report SNF prior to home PT Goal Formulation: With patient/family Time For Goal Achievement: 12/30/20 Potential to Achieve Goals: Fair Progress towards PT goals: Progressing toward goals    Frequency    Min 3X/week      PT Plan Current plan remains appropriate    Co-evaluation              AM-PAC PT "6 Clicks" Mobility   Outcome Measure  Help needed turning from your back to your side while in a flat bed without using bedrails?: A Lot Help needed moving from lying on your back to sitting on the side of a flat bed without using bedrails?: A Lot Help needed moving to and from a bed to a chair (including a wheelchair)?: A Lot Help needed standing up from a chair  using your arms (e.g., wheelchair or bedside chair)?: A Lot Help needed to walk in hospital room?: A Lot Help needed climbing 3-5 steps with a railing? : Total 6 Click Score: 11    End of Session Equipment Utilized During Treatment: Gait belt Activity Tolerance: Patient tolerated treatment well Patient left: with call bell/phone within reach;with family/visitor present;with chair alarm set;in chair Nurse Communication: Mobility status PT Visit Diagnosis: Muscle weakness (generalized) (M62.81);Difficulty in walking, not elsewhere classified (R26.2)     Time: 8887-5797 PT Time Calculation (min) (ACUTE ONLY): 20 min  Charges:  $Gait Training: 8-22 mins                     Abran Richard, PT Acute Rehab Services Pager [phone] [facility] 12/16/2020, 11:03 AM

## 2020-12-16 NOTE — TOC Progression Note (Signed)
Transition of Care [facility]) - Progression Note    Patient Details  Name: Dearius Hoffmann MRN: [id number] Date of Birth: July 25, 1925  Transition of Care [facility]) CM/SW Contact  Purcell Mouton, RN Phone Number: 12/16/2020, 3:22 PM  Clinical Narrative:    [facility] will not take pt back on Chemo.    Expected Discharge Plan: [place] Barriers to Discharge: Continued Medical Work up  Expected Discharge Plan and Services Expected Discharge Plan: [place] In-house Referral: Clinical Social Work     Living arrangements for the past 2 months: Single Family Home                                       Social Determinants of Health (SDOH) Interventions    Readmission Risk Interventions No flowsheet data found.

## 2020-12-16 NOTE — Progress Notes (Signed)
Occupational Therapy Progress Note  Patient seated in chair, finished with lunch. Agreeable to try standing to perform oral care at sink as he did this at baseline. Patient needing mod A to power up to standing from recliner chair, cues for hand placement. Patient min A with walker to take few steps to sink, and min G assist to stand for oral care for ~5 mins. Patient and spouse happy with patient progress in tolerating increased standing activity for self care tasks, however due to still needing increased assist for functional transfers pursuing short term rehab before D/C home. D/C recommendations updated, acute OT to follow.    12/16/20 1300  OT Visit Information  Last OT Received On 12/16/20  Assistance Needed +1  History of Present Illness Pt is 87 yo male who presented on 12/01/20 with increasing weakness and poor appetite.  Pt found to have retroperitoneal renal mass concerning for metastatic CA started on chemo 12/11/20.  Pt was dx with COVID ~2 weeks prior to admission and treated with antiviral and prednisone.  Also, with recent PE.  Pt with hx of colorectal and prostate CA with colostomy.  Precautions  Precautions Fall  Precaution Comments colostomy  Pain Assessment  Pain Assessment No/denies pain  Cognition  Arousal/Alertness Awake/alert  Behavior During Therapy WFL for tasks assessed/performed  Overall Cognitive Status Within Functional Limits for tasks assessed  General Comments very HOH however able to follow directions appropriately when able to hear OT. very pleasant  ADL  Overall ADL's  Needs assistance/impaired  Grooming Oral care;Min guard;Standing  Grooming Details (indicate cue type and reason) for safety, patient has not stood to brush teeth this admission spouse usually sets it up for patient to do in sitting. patient demonstrates improved standing tolerance ~5 mins at sink to complete oral care.  Toilet Transfer Moderate Cytogeneticist Details (indicate  cue type and reason) mod A sit to stand from recliner chair and cues for hand placement. once in standing patient min A for safety to ambulate few steps to/from sink  Functional mobility during ADLs Minimal assistance;Rolling walker;Cueing for safety  Bed Mobility  General bed mobility comments in chair  Balance  Overall balance assessment Needs assistance  Sitting-balance support Feet supported  Sitting balance-Leahy Scale Fair  Standing balance support Bilateral upper extremity supported;Single extremity supported  Standing balance-Leahy Scale Poor  Standing balance comment reliant on at least unilateral support on sink in standing for oral care  Transfers  Overall transfer level Needs assistance  Equipment used Rolling walker (2 wheeled)  Transfers Sit to/from Stand  Sit to Stand Mod assist  General transfer comment mod A to power up to standing from recliner chair, cues for hand placement. min A with sitting for eccentric control/safety  OT - End of Session  Equipment Utilized During Treatment Rolling walker  Activity Tolerance Patient tolerated treatment well  Patient left in chair;with call bell/phone within reach;with family/visitor present  Nurse Communication Mobility status  OT Assessment/Plan  OT Plan Discharge plan needs to be updated  OT Visit Diagnosis Unsteadiness on feet (R26.81);Muscle weakness (generalized) (M62.81)  OT Frequency (ACUTE ONLY) Min 2X/week  Follow Up Recommendations SNF  OT Equipment None recommended by OT  AM-PAC OT "6 Clicks" Daily Activity Outcome Measure (Version 2)  Help from another person eating meals? 3  Help from another person taking care of personal grooming? 3  Help from another person toileting, which includes using toliet, bedpan, or urinal? 2  Help from another person bathing (  including washing, rinsing, drying)? 2  Help from another person to put on and taking off regular upper body clothing? 3  Help from another person to put on and  taking off regular lower body clothing? 2  6 Click Score 15  Progressive Mobility  What is the highest level of mobility based on the progressive mobility assessment? Level 4 (Walks with assist in room) - Balance while marching in place and cannot step forward and back - Complete  Mobility Ambulated with assistance in room;Out of bed to chair with meals  OT Goal Progression  Progress towards OT goals Progressing toward goals  Acute Rehab OT Goals  Patient Stated Goal get stronger and return home - family does report SNF prior to home  ADL Goals  Additional ADL Goal #1 Patient will stand at sink to perform grooming task as evidence of improving activity tolerance  Additional ADL Goal #2 patient to be able to participate in UB and LB bathing tasks with supervision and set up to be able to maintain independence at home.  OT Time Calculation  OT Start Time (ACUTE ONLY) 1211  OT Stop Time (ACUTE ONLY) 1235  OT Time Calculation (min) 24 min  OT General Charges  $OT Visit 1 Visit  OT Treatments  $Self Care/Home Management  23-37 mins   Cody Hurley OT OT pager: [phone]

## 2020-12-17 ENCOUNTER — Encounter (HOSPITAL_COMMUNITY): Payer: Self-pay

## 2020-12-17 DIAGNOSIS — R531 Weakness: Secondary | ICD-10-CM

## 2020-12-17 DIAGNOSIS — C8513 Unspecified B-cell lymphoma, intra-abdominal lymph nodes: Secondary | ICD-10-CM | POA: Diagnosis not present

## 2020-12-17 DIAGNOSIS — R1900 Intra-abdominal and pelvic swelling, mass and lump, unspecified site: Secondary | ICD-10-CM | POA: Diagnosis not present

## 2020-12-17 DIAGNOSIS — L89156 Pressure-induced deep tissue damage of sacral region: Secondary | ICD-10-CM | POA: Diagnosis not present

## 2020-12-17 DIAGNOSIS — I2699 Other pulmonary embolism without acute cor pulmonale: Secondary | ICD-10-CM | POA: Diagnosis not present

## 2020-12-17 DIAGNOSIS — Z515 Encounter for palliative care: Secondary | ICD-10-CM

## 2020-12-17 LAB — SURGICAL PATHOLOGY

## 2020-12-17 LAB — GLUCOSE, CAPILLARY
Glucose-Capillary: 124 mg/dL — ABNORMAL HIGH (ref 70–99)
Glucose-Capillary: 154 mg/dL — ABNORMAL HIGH (ref 70–99)
Glucose-Capillary: 161 mg/dL — ABNORMAL HIGH (ref 70–99)
Glucose-Capillary: 128 mg/dL — ABNORMAL HIGH (ref 70–99)

## 2020-12-17 NOTE — TOC Progression Note (Signed)
Transition of Care [facility]) - Progression Note    Patient Details  Name: Cody Hurley MRN: [id number] Date of Birth: 03/10/1926  [facility] CM/SW Contact  Purcell Mouton, RN Phone Number: 12/17/2020, 9:35 AM  Clinical Narrative:    Spoke with pt's wife at bedside concerning SNF. [facility] declined pt related to not having a bed. Mrs. Meleski asked that [facility] be called for bed availability. [facility] was called and fax pt information. However pt has [facility] which will take several days for approval. If [facility] don't have a bed pt will discharge home with [facility] for [facility] and transportation by [facility] per Mrs. Babilonia.    Expected Discharge Plan: [place] Barriers to Discharge: Continued Medical Work up  Expected Discharge Plan and Services Expected Discharge Plan: [place] In-house Referral: Clinical Social Work     Living arrangements for the past 2 months: Single Family Home                                       Social Determinants of Health (SDOH) Interventions    Readmission Risk Interventions No flowsheet data found.

## 2020-12-17 NOTE — Progress Notes (Signed)
Nutrition Follow-up  INTERVENTION:   -Needs updated weight (last recorded 7/8)  -Ensure Enlive po BID, each supplement provides 350 kcal and 20 grams of protein   -Magic cup TID with meals, each supplement provides 290 kcal and 9 grams of protein  NUTRITION DIAGNOSIS:   Inadequate oral intake related to acute illness as evidenced by per patient/family report.  Improving.  GOAL:   Patient will meet greater than or equal to 90% of their needs  Progressing.  MONITOR:   PO intake, Supplement acceptance, Labs, Weight trends, I & O's  ASSESSMENT:   87 y.o. male with history of rectal cancer last treated on 2017-experiencing increasing weakness and poor appetite.  Patient was diagnosed with COVID infection about 2 weeks ago was treated with antiviral and p.o. prednisone.  Subsequent which patient also had come to the ER on June 26 with shortness of breath and at the time patient CT angiogram showed 2 small pulmonary embolism and was placed on apixaban.  The CT scan also showed retroperitoneal lesions concerning for metastatic cancer.  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 Now on a regular diet per recommendations from [facility]. Pt received chemotherapy on 7/15. Patient is accepting most Ensures. Per MD note, plan is for pt to go home with [facility] at discharge.   Admission weight: 135 lbs. No other weights this admission.  Medications reviewed.   Labs reviewed: CBGs: 124-210  Diet Order:   Diet Order             Diet regular Room service appropriate? Yes; Fluid consistency: Thin  Diet effective now                   EDUCATION NEEDS:   No education needs have been identified at this time  Skin:  Skin Assessment: Reviewed RN Assessment  Last BM:  7/13 -colostomy  Height:   Ht Readings from Last 1 Encounters:  12/04/20 5\' 7"  (1.702 m)    Weight:   Wt  Readings from Last 1 Encounters:  12/04/20 61.4 kg    BMI:  Body mass index is 21.21 kg/m.  Estimated Nutritional Needs:   Kcal:  1800-2000  Protein:  85-95g  Fluid:  2L/day  Clayton Bibles, MS, RD, LDN Inpatient Clinical Dietitian Contact information available via Amion

## 2020-12-17 NOTE — TOC Progression Note (Signed)
Transition of Care [facility]) - Progression Note    Patient Details  Name: Cody Hurley MRN: [id number] Date of Birth: December 01, 1925  Transition of Care [facility]) CM/SW Contact  Purcell Mouton, RN Phone Number: 12/17/2020, 12:32 PM  Clinical Narrative:    Spoke with pt's wife Mrs. Kalbfleisch concerning discharge plans to home. All SNF have declined pt. Wife selected to take pt home with [facility] [phone], fax [phone] orders will need to be faxed. [facility] [phone], fax [phone]. Please fax  orders for bed.    Expected Discharge Plan: [place] Barriers to Discharge: Continued Medical Work up  Expected Discharge Plan and Services Expected Discharge Plan: [place] In-house Referral: Clinical Social Work     Living arrangements for the past 2 months: Single Family Home                                       Social Determinants of Health (SDOH) Interventions    Readmission Risk Interventions No flowsheet data found.

## 2020-12-17 NOTE — Progress Notes (Addendum)
IP PROGRESS NOTE  Subjective:   Cody Hurley is sitting on the side of the bed eating breakfast. Has no complaints today.   Objective: Vital signs in last 24 hours: Blood pressure 107/68, pulse 96, temperature 98.6 F (37 C), temperature source Oral, resp. rate 14, height $RemoveBe'5\' 7"'jtMoUlxXN$  (1.702 m), weight 61.4 kg, SpO2 92 %.  Intake/Output from previous day: 07/20 0701 - 07/21 0700 In: 240 [P.O.:240] Out: 1700 [Urine:1700]  Physical Exam:  HEENT: No thrush Lungs: Inspiratory rhonchi at the left upper anterior chest, no respiratory distress Cardiac: Regular rate and rhythm Abdomen: No hepatosplenomegaly, nontender, left lower quadrant colostomy Extremities: Trace pitting edema to lower leg bilaterally Neurologic: Alert, follows commands, oriented   Lab Results: Recent Labs    12/15/20 0348 12/16/20 1000  WBC 4.6 5.1  HGB 13.7 13.3  HCT 41.0 39.5  PLT 75* 81*    BMET Recent Labs    12/14/20 0941 12/15/20 0348  NA 139 138  K 3.4* 3.9  CL 102 100  CO2 32 34*  GLUCOSE 165* 143*  BUN 14 13  CREATININE 0.41* <0.30*  CALCIUM 8.9 9.0  Ionized calcium on 12/03/2020: 5.1  Lab Results  Component Value Date   CEA1 2.57 11/18/2020    Studies/Results: No results found.  Medications: I have reviewed the patient's current medications.  Assessment/Plan: Stage III rectal cancer,ypT4a,N2 status post a low anterior resection in September 2017 Presenting November 2016 with a mass at 10 cm from the anus, clinical stage II by EUS-T3N0 Neoadjuvant Xeloda and radiation beginning 06/01/2015, completed 20 of 28 planned fractions, discontinued secondary to toxicity Elevated pretreatment CEA CT chest 04/10/2019-no evidence of metastatic disease, changes of interstitial lung disease, changes suggestive of cirrhosis CT chest 11/22/2020-scattered dependent groundglass airspace opacity and irregular interstitial opacity-nonspecific infectious or inflammatory, new bilateral adrenal nodules, extensive  ill-defined soft tissue surrounding the right kidney suspicious for malignancy CT abdomen/pelvis 11/26/2020-bilateral adrenal masses, multinodular masslike soft tissue density throughout the right perinephric space and renal sinus, new left periaortic and retroperitoneal adenopathy, cirrhosis   Prostate cancer 1990 treated with a prostatectomy History of a TIA Carpal tunnel syndrome Palpable liver edge-ultrasound abdomen 04/10/2019-fatty infiltration of the liver, normal portal vein flow Left lung subsegmental pulmonary emboli 11/22/2020-apixaban Anorexia/weight loss COVID-19 infection June 2022 Admission 12/01/2020 with failure to thrive Elevated calcium-normal ionized calcium 12/03/2020 Retroperitoneal biopsy from 12/03/2020 consistent with large B-cell lymphoma CD20 positive, KI-67 95%, FISH panel pending Cycle 1 CVP-rituximab 12/10/2020  12.  Thrombocytopenia secondary to chemotherapy and lymphoma   Cody Hurley is now at day 7 following cycle 1 CVP-rituximab.  He tolerated the chemotherapy without significant acute toxicity.  The platelet count is moderately decreased on labs from 7/20. Will stop his IVF and Allopurinol at this time.   He has been encouraged to increase his diet and activity as tolerated.    Given stability of platelet count, he does not need a CBC with differential on Friday, 12/18/2020. He will need a CBC with differential, CMET, LDH on either Monday 12/21/2020 or Tuesday 12/22/2020.  These labs are to be drawn at the skilled nursing facility and faxed to our office.  Fax number is [phone].  Labs may be drawn through his PICC line.  These instructions were written on his discharge instructions for his AVS.  Cycle 2 CVP-rituximab will be given as an outpatient on 12/31/2020 - appointments have been scheduled.   Wife hoping for discharge to SNF in [place] today. She will talk with TOC.  Recommendations: 1.  D/C IVF. 2.  D/C Allopurinol.  3.  Leave PICC in place at  discharge 4.  Increase ambulation and diet as tolerated 5.  Awaiting bed placement at SNF for rehab.  He will need a CBC with differential, CMET LDH on 7/25 or 7/26.  Labs to be drawn at [facility] and faxed to our office at [phone]. 6.  Follow-up is scheduled in our office for 8/4.    LOS: 16 days   Mikey Bussing, NP   12/17/2020, 8:20 AM Cody Hurley was interviewed and examined.  His performance status appears improved.  He is waiting on a skilled nursing facility.  The platelet count was stable yesterday.  We will discontinue IV fluids and allopurinol.  Outpatient follow-up as scheduled at the Cancer center.  His wife is at the bedside when I saw him this morning.  We discussed the treatment plan.

## 2020-12-17 NOTE — Progress Notes (Signed)
    Durable Medical Equipment  (From admission, onward)           Start     Ordered   12/17/20 1223  For home use only DME Hospital bed  Once       Question Answer Comment  Length of Need Lifetime   Patient has (list medical condition): Pulmonary Embolism, Failure to thrive, Rectal Cancer, Prostate Cancer,Retroperitoneal mass, Pressure Injury of skin   The above medical condition requires: Patient requires the ability to reposition frequently   Head must be elevated greater than: 45 degrees   Bed type Semi-electric   Support Surface: Low Air loss Mattress      12/17/20 1227

## 2020-12-17 NOTE — Progress Notes (Signed)
PROGRESS NOTE  Cody Hurley [id number] DOB: 1925-12-03 DOA: 12/01/2020 PCP: [name], MD  HPI/Recap of past 34 hours: 87 year old male with past medical history of rectal cancer and prostate cancer presented on 7/5 with decreased appetite and weight loss of 20 to 30 pounds and had recent COVID infection 2 weeks prior to coming in, treated with antiviral agents and steroids.  Found to have small pulmonary embolus as well as retroperitoneal masses concerning for metastatic cancer.  Admitted to the hospitalist service.  Oncology consulted.  Biopsy noted aggressive B-cell lymphoma.  Oncology gave patient chemo session x1 on 7/15.  Seen by PT recommended skilled nursing, however due to patient being on recent chemotherapy, skilled nursing unable to take patient.  Home health being set up.  Patient today doing okay, having some weakness.  Assessment/Plan: Active Problems:   Pulmonary embolism ([place]): On Eliquis   FTT (failure to thrive) in adult:Nutrition Status: Nutrition Problem: Inadequate oral intake Etiology: acute illness Signs/Symptoms: per patient/family report Interventions: Ensure Enlive (each supplement provides 350kcal and 20 grams of protein), MVI, Magic cup Seen by nutrition  Deconditioning: Home health    Hypercalcemia   History of rectal cancer   History of prostate cancer   COVID-19 virus infection: Recent COVID-19 infection status posttreatment.  Stable.    Non-Hodgkin lymphoma [facility]): Status post 1 round of chemotherapy.  Status post PICC line placement.  Next chemo session in the next 2 to 3 weeks.    Pressure injury of skin: Present on admission Pressure Injury 12/13/20 Sacrum Stage 1 -  Intact skin with non-blanchable redness of a localized area usually over a bony prominence. (Active)  12/13/20 2200  Location: Sacrum  Location Orientation:   Staging: Stage 1 -  Intact skin with non-blanchable redness of a localized area usually over a bony prominence.   Wound Description (Comments):   Present on Admission:     Code Status: DNR  Family Communication: Wife at bedside  Disposition Plan: Discharge on 7/22 with home health   Consultants: Oncology Infectious disease  Procedures: Chemotherapy x1 7/15 CT-guided needle biopsy of right retroperitoneal mass 7/7 PICC line placement  Antimicrobials: Brief course of broad-spectrum antibiotics discontinued  DVT prophylaxis: Eliquis  Level of care: Med-Surg   Objective: Vitals:   12/17/20 0427 12/17/20 1344  BP: 107/68 122/66  Pulse: 96 88  Resp: 14 20  Temp: 98.6 F (37 C) 97.7 F (36.5 C)  SpO2: 92% (!) 70%    Intake/Output Summary (Last 24 hours) at 12/17/2020 1548 Last data filed at 12/17/2020 1200 Gross per 24 hour  Intake 480 ml  Output 1750 ml  Net -1270 ml    Filed Weights   12/04/20 0406  Weight: 61.4 kg   Body mass index is 21.21 kg/m.  Exam:  General: Alert and oriented x2, no acute distress Cardiovascular: Regular rate and rhythm, S1-S2 Respiratory: Clear to auscultation bilaterally Abdomen: Soft, nontender, nondistended, positive bowel sounds Musculoskeletal: No clubbing or cyanosis or edema Skin: Sacral decubitus ulcer Psychiatry: Appropriate, no acute distress Neurology: No focal deficits   Data Reviewed: CBC: Recent Labs  Lab 12/12/20 0541 12/13/20 0611 12/14/20 0941 12/15/20 0348 12/16/20 1000  WBC 7.4 5.7 5.2 4.6 5.1  NEUTROABS 6.8 5.2 4.7 4.2 4.5  HGB 13.9 13.6 13.9 13.7 13.3  HCT 41.8 40.5 41.1 41.0 39.5  MCV 104.8* 102.5* 104.1* 103.3* 104.8*  PLT 122* 92* 82* 75* 81*    Basic Metabolic Panel: Recent Labs  Lab 12/11/20 1015 12/12/20 0541  12/13/20 0611 12/14/20 0941 12/15/20 0348  NA 136 135 136 139 138  K 3.5 4.4 3.4* 3.4* 3.9  CL 105 100 100 102 100  CO2 14* 20* 31 32 34*  GLUCOSE 89 154* 154* 165* 143*  BUN 12 16 16 14 13   CREATININE 0.32* 0.49* 0.40* 0.41* <0.30*  CALCIUM 8.5* 9.5 8.9 8.9 9.0  MG  --   --    --  2.3 2.4    GFR: CrCl cannot be calculated (This lab value cannot be used to calculate CrCl because it is not a number: <0.30). Liver Function Tests: Recent Labs  Lab 12/11/20 1015 12/12/20 0541 12/13/20 0611 12/14/20 0941 12/15/20 0348  AST 57* 62* 63* 74* 82*  ALT 30 32 35 46* 56*  ALKPHOS 56 66 59 69 69  BILITOT 1.0 0.8 1.0 1.2 1.4*  PROT 5.1* 5.7* 5.4* 5.5* 5.5*  ALBUMIN 2.3* 2.6* 2.4* 2.5* 2.4*    No results for input(s): LIPASE, AMYLASE in the last 168 hours. No results for input(s): AMMONIA in the last 168 hours. Coagulation Profile: No results for input(s): INR, PROTIME in the last 168 hours. Cardiac Enzymes: No results for input(s): CKTOTAL, CKMB, CKMBINDEX, TROPONINI in the last 168 hours. BNP (last 3 results) No results for input(s): PROBNP in the last 8760 hours. HbA1C: No results for input(s): HGBA1C in the last 72 hours. CBG: Recent Labs  Lab 12/16/20 1131 12/16/20 1636 12/16/20 2040 12/17/20 0753 12/17/20 1143  GLUCAP 124* 147* 210* 124* 154*    Lipid Profile: No results for input(s): CHOL, HDL, LDLCALC, TRIG, CHOLHDL, LDLDIRECT in the last 72 hours. Thyroid Function Tests: No results for input(s): TSH, T4TOTAL, FREET4, T3FREE, THYROIDAB in the last 72 hours. Anemia Panel: No results for input(s): VITAMINB12, FOLATE, FERRITIN, TIBC, IRON, RETICCTPCT in the last 72 hours. Urine analysis:    Component Value Date/Time   COLORURINE YELLOW 12/07/2020 1017   APPEARANCEUR CLEAR 12/07/2020 1017   LABSPEC 1.021 12/07/2020 1017   PHURINE 5.0 12/07/2020 1017   GLUCOSEU NEGATIVE 12/07/2020 1017   HGBUR NEGATIVE 12/07/2020 1017   BILIRUBINUR NEGATIVE 12/07/2020 1017   KETONESUR 20 (A) 12/07/2020 1017   PROTEINUR NEGATIVE 12/07/2020 1017   NITRITE NEGATIVE 12/07/2020 1017   LEUKOCYTESUR NEGATIVE 12/07/2020 1017   Sepsis Labs: @LABRCNTIP (procalcitonin:4,lacticidven:4)  ) No results found for this or any previous visit (from the past 240  hour(s)).     Studies: No results found.  Scheduled Meds:  apixaban  5 mg Oral BID   Chlorhexidine Gluconate Cloth  6 each Topical Daily   feeding supplement  237 mL Oral BID BM   food thickener  1 packet Oral TID WC, HS, 0200    Continuous Infusions:     LOS: 16 days     Annita Brod, MD [facility]   12/17/2020, 3:48 PM

## 2020-12-18 ENCOUNTER — Other Ambulatory Visit: Payer: Self-pay | Admitting: *Deleted

## 2020-12-18 DIAGNOSIS — C20 Malignant neoplasm of rectum: Secondary | ICD-10-CM

## 2020-12-18 DIAGNOSIS — L89151 Pressure ulcer of sacral region, stage 1: Secondary | ICD-10-CM | POA: Diagnosis not present

## 2020-12-18 DIAGNOSIS — C8513 Unspecified B-cell lymphoma, intra-abdominal lymph nodes: Secondary | ICD-10-CM | POA: Diagnosis not present

## 2020-12-18 DIAGNOSIS — R1900 Intra-abdominal and pelvic swelling, mass and lump, unspecified site: Secondary | ICD-10-CM | POA: Diagnosis not present

## 2020-12-18 DIAGNOSIS — R531 Weakness: Secondary | ICD-10-CM | POA: Diagnosis not present

## 2020-12-18 DIAGNOSIS — I2699 Other pulmonary embolism without acute cor pulmonale: Secondary | ICD-10-CM | POA: Diagnosis not present

## 2020-12-18 LAB — GLUCOSE, CAPILLARY
Glucose-Capillary: 130 mg/dL — ABNORMAL HIGH (ref 70–99)
Glucose-Capillary: 143 mg/dL — ABNORMAL HIGH (ref 70–99)
Glucose-Capillary: 119 mg/dL — ABNORMAL HIGH (ref 70–99)
Glucose-Capillary: 169 mg/dL — ABNORMAL HIGH (ref 70–99)

## 2020-12-18 MED ORDER — ENSURE ENLIVE PO LIQD: 237.0000 mL | Freq: Two times a day (BID) | ORAL | 12 refills | Status: AC

## 2020-12-18 NOTE — TOC Progression Note (Addendum)
Transition of Care [facility]) - Progression Note    Patient Details  Name: Cody Hurley MRN: [id number] Date of Birth: February 15, 1926  Transition of Care [facility]) CM/SW Contact  Alexandre Lightsey, Juliann Pulse, RN Phone Number: 12/18/2020, 11:52 AM  Clinical Narrative: [place] for DME tel#[phone] spoke to [place] bed not delivered until all info completed-awaiting wound status-noted only a stage 1-this does not qualify for a low air loss mattress,patient can receive an APP Pump,& pad. Please put in HHRN/HHPT orders, & face to face-add in comments the picc line flushes,med,frequency,& dsg change once a week,labs(who to send to,& days.The dme orders-we cannot do the low air loss mattress-does not qualify since wound is only stage 1,please order APP Pump, & pad.Will need PTAR @ d/c.   2:41p-[facility]-rep Kaitlin-will need additional info-demographic sheet,d/c summary w/meds listed-HHRN/HHPT start of care next Tuesday. [facility] dme rep spoke to Paige-f/u APP pump& pad for hospital bed delivery-they are processing for d/c today.Will update attending.    Expected Discharge Plan: [place] Barriers to Discharge: Continued Medical Work up  Expected Discharge Plan and Services Expected Discharge Plan: [place] In-house Referral: Clinical Social Work     Living arrangements for the past 2 months: Single Family Home                                       Social Determinants of Health (SDOH) Interventions    Readmission Risk Interventions No flowsheet data found.

## 2020-12-18 NOTE — Progress Notes (Addendum)
IP PROGRESS NOTE  Subjective:   [name] has no complaints this morning.  Family friend at the bedside.  They anticipate charge to home today with home health.  Objective: Vital signs in last 24 hours: Blood pressure 123/75, pulse 87, temperature (!) 97.4 F (36.3 C), temperature source Oral, resp. rate 18, height 5' 7"  (1.702 m), weight 70.6 kg, SpO2 96 %.  Intake/Output from previous day: 07/21 0701 - 07/22 0700 In: 480 [P.O.:480] Out: 1400 [Urine:1200; Stool:200]  Physical Exam:  HEENT: No thrush Lungs: Inspiratory rhonchi at the left upper anterior chest, no respiratory distress Cardiac: Regular rate and rhythm Abdomen: No hepatosplenomegaly, nontender, left lower quadrant colostomy Extremities: Trace pitting edema to lower leg bilaterally Neurologic: Alert, follows commands, oriented  PICC without erythema  Lab Results: Recent Labs    12/16/20 1000  WBC 5.1  HGB 13.3  HCT 39.5  PLT 81*    BMET No results for input(s): NA, K, CL, CO2, GLUCOSE, BUN, CREATININE, CALCIUM in the last 72 hours. Ionized calcium on 12/03/2020: 5.1  Lab Results  Component Value Date   CEA1 2.57 11/18/2020    Studies/Results: No results found.  Medications: I have reviewed the patient's current medications.  Assessment/Plan: Stage III rectal cancer,ypT4a,N2 status post a low anterior resection in September 2017 Presenting November 2016 with a mass at 10 cm from the anus, clinical stage II by EUS-T3N0 Neoadjuvant Xeloda and radiation beginning 06/01/2015, completed 20 of 28 planned fractions, discontinued secondary to toxicity Elevated pretreatment CEA CT chest 04/10/2019-no evidence of metastatic disease, changes of interstitial lung disease, changes suggestive of cirrhosis CT chest 11/22/2020-scattered dependent groundglass airspace opacity and irregular interstitial opacity-nonspecific infectious or inflammatory, new bilateral adrenal nodules, extensive ill-defined soft tissue  surrounding the right kidney suspicious for malignancy CT abdomen/pelvis 11/26/2020-bilateral adrenal masses, multinodular masslike soft tissue density throughout the right perinephric space and renal sinus, new left periaortic and retroperitoneal adenopathy, cirrhosis   Prostate cancer 1990 treated with a prostatectomy History of a TIA Carpal tunnel syndrome Palpable liver edge-ultrasound abdomen 04/10/2019-fatty infiltration of the liver, normal portal vein flow Left lung subsegmental pulmonary emboli 11/22/2020-apixaban Anorexia/weight loss COVID-19 infection June 2022 Admission 12/01/2020 with failure to thrive Elevated calcium-normal ionized calcium 12/03/2020 Retroperitoneal biopsy from 12/03/2020 consistent with large B-cell lymphoma CD20 positive, KI-67 95%, FISH positive for 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  Thrombocytopenia secondary to chemotherapy and lymphoma   [name] is now at day 8 following cycle 1 CVP-rituximab.  He tolerated the chemotherapy without significant acute toxicity.  The platelet count was last checked on 7/20 and was moderately decreased.  We received information from pathology that he has a double hit lymphoma.  Typically, these are treated with R-EPOCH but given his age and functional status, he has not a candidate for this chemotherapy regimen.  Therefore, will consider addition of dose Adriamycin with second cycle of chemotherapy as an outpatient.  He has been encouraged to increase his diet and activity as tolerated.    He will need a CBC with differential, CMET, LDH on either Monday 12/21/2020 or Tuesday 12/22/2020.  We are trying to arrange for home health to have these labs drawn.  Additionally, he has a PICC line place and this will need to be flushed 3 times a week with a dressing change once a week.  I have messaged the case manager to see if arrangements can be made for the labs and  PICC line flushes/dressing change can be done by home health.  Lab results can be faxed to our office.  Fax number is [phone].  Labs may be drawn through his PICC line.  These instructions were written on his discharge instructions for his AVS.  Cycle 2 CVP-rituximab will be given as an outpatient on 12/31/2020 - appointments have been scheduled.   Recommendations: 1.  Leave PICC in place at discharge.  Case manager to speak with home health agency to see if PICC line can be flushed 3 times a week with dressing change weekly in the home. 2.  Increase ambulation and diet as tolerated.  Home physical therapy. 3.  Anticipate discharge to home later today with home health.  He will need a CBC with differential, CMET LDH on 7/25 or 7/26.  Labs to be drawn at [facility] and faxed to our office at [phone]. 6.  Follow-up is scheduled in our office for 8/4.    LOS: 17 days   Cody Bussing, Cody Hurley   12/18/2020, 7:56 AM  [name] was interviewed and examined.  He appears stable.  The plan is now for discharge to home with home health.  We received the molecular testing results yesterday evening.  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  These lymphomas can behave in an aggressive fashion.  We will consider adding low-dose doxorubicin with the next cycle of chemotherapy.  I discussed the molecular findings with [name] and his caregiver.  Outpatient follow-up as scheduled with Cancer center.  I was present for greater than 50% of today's visit.  I performed medical decision making.

## 2020-12-18 NOTE — Discharge Summary (Signed)
Discharge Summary  Cody Hurley [id number] DOB: 12-01-1925  PCP: [name], MD  Admit date: 12/01/2020 Discharge date: 12/18/2020  Time spent: 35 minutes  Recommendations for Outpatient Follow-up:  Patient will follow up early next week with oncology who will provide him and his wife with instructions on how to manage his PICC line. Patient's next chemotherapy session will occur in 2 to 3 weeks Patient will go home with home care equipment including hospital bed, but does not have a home health agency that will follow him  Discharge Diagnoses:  Active Hospital Problems   Diagnosis Date Noted   Retroperitoneal mass 12/01/2020   Weakness generalized    Palliative care by specialist    Pressure injury of skin 12/14/2020   Non-Hodgkin lymphoma ([place]) 12/09/2020   Acidosis    Lactic acidosis    Adult failure to thrive 12/02/2020   Hypercalcemia 12/02/2020   History of rectal cancer 12/02/2020   History of prostate cancer 12/02/2020   COVID-19 virus infection: Recent COVID-19 infection status posttreatment 12/02/2020   Pulmonary embolism ([place]) 11/22/2020    Resolved Hospital Problems  No resolved problems to display.    Discharge Condition: Improved, being discharged home  Diet recommendation: Regular diet with Ensure supplement shakes  Vitals:   12/18/20 0534 12/18/20 1326  BP: 123/75 115/68  Pulse: 87 95  Resp: 18 18  Temp: (!) 97.4 F (36.3 C) 97.8 F (36.6 C)  SpO2: 96% 94%    History of present illness:  87 year old male with past medical history of rectal cancer and prostate cancer presented on 7/5 with decreased appetite and weight loss of 20 to 30 pounds and had recent COVID infection 2 weeks prior to coming in, treated with antiviral agents and steroids.  Found to have small pulmonary embolus as well as retroperitoneal masses concerning for metastatic cancer.  Admitted to the hospitalist service  Hospital Course:    Non-Hodgkin lymphoma [facility]):  Oncology consulted.  Biopsy noted aggressive B-cell lymphoma.  Oncology gave patient chemo session x1 on 7/15.  Status post 1 round of chemotherapy.  Status post PICC line placement.  Next chemo session in the next 2 to 3 weeks.  Because of chemotherapy session, patient unable to be set up with skilled nursing.  He is able to go home with home health equipment under the care of his wife.  He will return back to be cancer center early next week for PICC line care and to set a date for his next chemotherapy session.  Pulmonary embolism ([place]): On Eliquis    FTT (failure to thrive) in adult:Nutrition Status: Nutrition Problem: Inadequate oral intake Etiology: acute illness Signs/Symptoms: per patient/family report Interventions: Ensure Enlive (each supplement provides 350kcal and 20 grams of protein), MVI, Magic cup Seen by nutrition   Deconditioning: Unfortunately, not a candidate for skilled nursing due to recent chemo   Stage I sacral ulcer: Pressure Injury 12/13/20 Sacrum Stage 1 -  Intact skin with non-blanchable redness of a localized area usually over a bony prominence. (Active)  12/13/20 2200  Location: Sacrum  Location Orientation:   Staging: Stage 1 -  Intact skin with non-blanchable redness of a localized area usually over a bony prominence.  Wound Description (Comments):   Present on Admission:      COVID-19 virus infection: Recent COVID-19 infection status posttreatment.  Stable.   Consultants: Oncology Infectious disease   Procedures: Chemotherapy x1 7/15 CT-guided needle biopsy of right retroperitoneal mass 7/7 PICC line placement  Discharge Exam: BP  115/68 (BP Location: Left Arm)   Pulse 95   Temp 97.8 F (36.6 C) (Oral)   Resp 18   Ht '5\' 7"'$  (1.702 m)   Wt 70.6 kg   SpO2 94%   BMI 24.38 kg/m   General: Awake, oriented x2, hard of hearing Cardiovascular: Regular rate and rhythm, S1-S2 Respiratory: Clear to auscultation bilaterally  Discharge  Instructions You were cared for by a hospitalist during your hospital stay. If you have any questions about your discharge medications or the care you received while you were in the hospital after you are discharged, you can call the unit and asked to speak with the hospitalist on call if the hospitalist that took care of you is not available. Once you are discharged, your primary care physician will handle any further medical issues. Please note that NO REFILLS for any discharge medications will be authorized once you are discharged, as it is imperative that you return to your primary care physician (or establish a relationship with a primary care physician if you do not have one) for your aftercare needs so that they can reassess your need for medications and monitor your lab values.  Discharge Instructions     Diet - low sodium heart healthy   Complete by: As directed    Increase activity slowly   Complete by: As directed    No wound care   Complete by: As directed    TREATMENT CONDITIONS   Complete by: As directed    Patient should have CBC & CMP within 7 days prior to chemotherapy administration. NOTIFY MD IF: ANC < 1500, Hemoglobin < 8, PLT < 100,000,  Total Bili > 1.5, Creatinine > 1.5, ALT & AST > 80 or if patient has unstable vital signs: Temperature > 38.5, SBP > 180 or < 90, RR > 30 or HR > 100.      Allergies as of 12/18/2020       Reactions   Penicillin G Rash        Medication List     TAKE these medications    acetaminophen 500 MG tablet Commonly known as: TYLENOL Take 500-1,000 mg by mouth every 6 (six) hours as needed for mild pain (or headaches).   Apixaban Starter Pack ('10mg'$  and '5mg'$ ) Commonly known as: ELIQUIS STARTER PACK Take as directed on package: start with two-'5mg'$  tablets twice daily for 7 days. On day 8, switch to one-'5mg'$  tablet twice daily. What changed:  how much to take how to take this when to take this additional instructions   feeding  supplement Liqd Take 237 mLs by mouth 2 (two) times daily between meals. Start taking on: December 19, 2020   multivitamin tablet Take 1 tablet by mouth daily.   oxybutynin 5 MG tablet Commonly known as: DITROPAN Take 5 mg by mouth daily.   vitamin B-12 100 MCG tablet Commonly known as: CYANOCOBALAMIN Take 100 mcg by mouth daily.   Vitamin D-3 25 MCG (1000 UT) Caps Take 2,000 Units by mouth daily with breakfast.               Durable Medical Equipment  (From admission, onward)           Start     Ordered   12/18/20 1253  For home use only DME Other see comment  Once       Comments: APP  Question:  Length of Need  Answer:  Lifetime   12/18/20 1254   12/17/20 1223  For  home use only DME Hospital bed  Once       Question Answer Comment  Length of Need Lifetime   Patient has (list medical condition): Pulmonary Embolism, Failure to thrive, Rectal Cancer, Prostate Cancer,Retroperitoneal mass, Pressure Injury of skin   The above medical condition requires: Patient requires the ability to reposition frequently   Head must be elevated greater than: 45 degrees   Bed type Semi-electric   Support Surface: Low Air loss Mattress      12/17/20 1227           Allergies  Allergen Reactions   Penicillin G Rash    Follow-up Information     [facility] up.   Why: APP pump/pad for hospital bed Contact information: equipment [phone]                 The results of significant diagnostics from this hospitalization (including imaging, microbiology, ancillary and laboratory) are listed below for reference.    Significant Diagnostic Studies: DG Chest 1 View  Result Date: 12/13/2020 CLINICAL DATA:  Shortness of breath. Patient with rectal cancer and prostate cancer. Increasing weakness. EXAM: CHEST  1 VIEW COMPARISON:  December 03, 2020 FINDINGS: No pneumothorax. A right PICC line is identified with the distal tip terminating in the SVC. No pulmonary nodules  or masses. No focal infiltrates. The cardiomediastinal silhouette is unremarkable. IMPRESSION: The right PICC line is in good position. No acute abnormalities are identified. Electronically Signed   By: Dorise Bullion III M.D   On: 12/13/2020 13:03   CT Angio Chest PE W/Cm &/Or Wo Cm  Addendum Date: 11/22/2020   ADDENDUM REPORT: 11/22/2020 16:51 ADDENDUM: Addendum to correct original report: Examination is positive for two foci of subsegmental embolus present in the lingula and left lower lobe. Findings were discussed by telephone with Dr. Alvino Chapel at 4:45 PM, 11/22/2020. Electronically Signed   By: Eddie Candle M.D.   On: 11/22/2020 16:51   Result Date: 11/22/2020 CLINICAL DATA:  Weakness, loss of appetite, cough, lower extremity swelling, concern for PE EXAM: CT ANGIOGRAPHY CHEST WITH CONTRAST TECHNIQUE: Multidetector CT imaging of the chest was performed using the standard protocol during bolus administration of intravenous contrast. Multiplanar CT image reconstructions and MIPs were obtained to evaluate the vascular anatomy. CONTRAST:  148m OMNIPAQUE IOHEXOL 350 MG/ML SOLN COMPARISON:  04/10/2019 FINDINGS: Cardiovascular: Satisfactory opacification of the pulmonary arteries to the segmental level. No evidence of pulmonary embolism. Normal heart size. Three-vessel coronary artery calcifications and/or stents. No pericardial effusion. Aortic atherosclerosis. Mediastinum/Nodes: No enlarged mediastinal, hilar, or axillary lymph nodes. Small hiatal hernia. Thyroid gland, trachea, and esophagus demonstrate no significant findings. Lungs/Pleura: Moderate centrilobular emphysema. Scattered and dependent bilateral ground-glass airspace opacity. Irregular peripheral interstitial opacity interlobular septal thickening, most conspicuous at the bilateral lung bases. No pleural effusion or pneumothorax. Upper Abdomen: Coarse, nodular cirrhotic morphology of the liver. Trace perihepatic ascites. Numerous gallstones in  gallbladder. There are new bilateral adrenal nodules, measuring 3.7 x 2.6 cm on the right (series 5, image 301) and 3.1 x 3.0 cm (series 5, image 347). There is extensive, ill-defined soft tissue stranding about the right kidney and within the adjacent perinephric fat (series 5, image 285, 387). Musculoskeletal: No chest wall abnormality. No acute or significant osseous findings. Review of the MIP images confirms the above findings. IMPRESSION: 1. Negative examination for pulmonary embolism. 2. Scattered and dependent bilateral ground-glass airspace opacity with irregular peripheral interstitial opacity and interlobular septal thickening, most conspicuous at the bilateral  lung bases. Findings are nonspecific and infectious or inflammatory, including COVID-19 airspace disease if clinically suspected. 3. New bilateral adrenal nodules, concerning for metastatic disease. There is there is no intrathoracic evidence of malignancy. 4. There is extensive, ill-defined soft tissue stranding about the right kidney and within the adjacent perinephric fat. This is also highly suspicious for malignancy, perhaps lymphomatous involvement of the right kidney given appearance. 5. Recommend dedicated contrast enhanced imaging of the abdomen and pelvis to further evaluate above described abdominal findings. 6. Cholelithiasis. 7. Emphysema. 8. Coronary artery disease. Aortic Atherosclerosis (ICD10-I70.0) and Emphysema (ICD10-J43.9). Electronically Signed: By: Eddie Candle M.D. On: 11/22/2020 16:30   CT ABDOMEN PELVIS W CONTRAST  Result Date: 12/06/2020 CLINICAL DATA:  Sepsis, elevated lactic acidosis EXAM: CT ABDOMEN AND PELVIS WITH CONTRAST TECHNIQUE: Multidetector CT imaging of the abdomen and pelvis was performed using the standard protocol following bolus administration of intravenous contrast. CONTRAST:  135m OMNIPAQUE IOHEXOL 300 MG/ML  SOLN COMPARISON:  CT 11/26/2020 FINDINGS: Lower chest: Some coarsened reticular  interstitial changes noted in the lung bases, not significantly changed from prior. No consolidative process or layering effusion. Cardiac size within normal limits. Coronary artery calcifications. No pericardial effusion. Hepatobiliary: Nodular hepatic surface contour. Caudate and left lobe hypertrophy. No focal liver lesion. Multiple calcified gallstones within the gallbladder. No focal pericholecystic inflammation. No biliary ductal dilatation or intraductal gallstones. Pancreas: No pancreatic ductal dilatation or surrounding inflammatory changes. Spleen: Normal in size. No concerning splenic lesions. Adrenals/Urinary Tract: Marked thickening of the bilateral adrenal glands measuring up to 2.7 cm on the left and 2.6 cm on the right concerning for metastatic infiltration. Stable appearance of the bilateral kidneys with small fluid attenuation cyst in the interpolar left kidney. Bilateral the urothelial thickening, right greater than left, increasing conspicuity from comparison prior. Mild circumferential bladder wall thickening noted as well. No urolithiasis or frank hydronephrosis. Bulky multinodular masslike soft tissue attenuation is seen throughout the right perinephric and pararenal space and extending into the renal sinus, overall appearance unchanged from comparison. Stomach/Bowel: Hiatal hernia. Distal stomach and duodenum with some questionable mural thickening versus underdistention or peristaltic contraction. No other small bowel thickening or dilatation is seen. Postsurgical changes from prior low anterior resection with a left lower quadrant colostomy in place some mild circumferential thickening of the colonic remnant, similar to prior. Presacral soft tissue attenuation and fluid is also quite similar to comparison. No evidence of bowel obstruction. Extensive colonic diverticulosis without evidence of acute diverticulitis. Vascular/Lymphatic: Extensive atherosclerotic calcification throughout the  abdominal aorta and branch vessels. Retroperitoneal adenopathy, along the left periaortic region is again seen measuring up to 2.3 cm short axis, unchanged from prior. Reproductive: Prostate appears surgically absent. Other: Small volume ascites, slightly increasing from comparison prior with some increasing edematous changes of the central mesentery. Presacral soft tissue thickening is stable, described above. No free air. Peristomal herniation of a solitary loop of small bowel at the site of end colostomy in the left upper quadrant. No resulting obstruction or vascular compromise. Musculoskeletal: No suspicious lytic or blastic lesions. Grade 1 anterolisthesis L4 on 5. Dextrocurvature of the lumbar spine, apex L5. Multilevel degenerative changes are present in the imaged portions of the spine. IMPRESSION: Bulky multinodular soft tissue density in the right Peri and pararenal spaces concerning for metastatic disease and/or lymphoma. Additional periaortic/retroperitoneal adenopathy and conspicuous nodular thickening of the adrenal glands compatible with additional metastatic deposits. Marked urothelial thickening of the right ureter and renal sinus. More mild changes on the left,  could reflect ascending urinary tract infection/pyelitis. Correlate with urinalysis. Increasing abdominal ascites and edematous changes in the mesentery, correlate with fluid status. Background of cirrhotic change.  No focal liver lesion. Mild thickening of the distal stomach and duodenal sweep, can be related to cirrhotic features/portal enteropathy though should correlate for clinical features of gastric duodenitis or other complication. Cholelithiasis without evidence of acute cholecystitis. Postsurgical changes from low anterior resection. Fat and small bowel containing parastomal hernia at the site of end colostomy. Stable thickening along the presacral space. Mild mural thickening of the rectal remnant, nonspecific. Diverticulosis  without evidence of acute diverticulitis. Electronically Signed   By: Lovena Le M.D.   On: 12/06/2020 23:40   CT ABDOMEN PELVIS W CONTRAST  Result Date: 11/27/2020 CLINICAL DATA:  Primary Cancer Type: Rectal Imaging Indication: Restaging for new clinical signs or symptoms Interval therapy since last imaging? No Initial Cancer Diagnosis Date: 05/15/2015; established by: Biopsy-proven Detailed Pathology: Stage III rectal cancer. Moderate to poorly differentiated adenocarcinoma. Primary Tumor location: Mass at 10 cm from the anus. New metastatic disease involving the adrenal glands and perinephric fat. Surgeries: Low anterior resection 01/2016. Descending colostomy and Hartmann's pouch. Chemotherapy: Yes; Ongoing?  No; Most recent administration: 2018 Immunotherapy? No Radiation therapy?  Yes; Date Range: 2017 Other Cancer Therapies: Prostate cancer; prostatectomy 1990. EXAM: CT ABDOMEN AND PELVIS WITH CONTRAST TECHNIQUE: Multidetector CT imaging of the abdomen and pelvis was performed using the standard protocol following bolus administration of intravenous contrast. CONTRAST:  66m OMNIPAQUE IOHEXOL 300 MG/ML  SOLN COMPARISON:  09/26/2017. FINDINGS: Lower chest: No acute findings. Hepatobiliary: The liver shows decreased steatosis but increased capsular nodularity and caudate hypertrophy, highly suspicious for cirrhosis. No hepatic masses are identified. Multiple calcified gallstones are seen, however there is no evidence of cholecystitis or biliary ductal dilatation. Pancreas:  No mass or inflammatory changes. Spleen:  Within normal limits in size and appearance. Adrenals/Urinary Tract: New left adrenal mass is seen measuring 2.7 x 1.6 cm. New bilobed right adrenal mass is also seen with area involving the body of the gland measuring 2.6 x 1 point 8 cm on image 16/2. No renal parenchymal masses are identified, however there is bulky multinodular mass-like soft tissue density seen throughout the right  perinephric space and renal sinus, new since prior exam. No evidence of ureteral calculi or hydronephrosis. Unremarkable unopacified urinary bladder. Stomach/Bowel: Postop changes are seen from previous low anterior resection with left lower quadrant colostomy. A small parastomal hernia is seen containing a small bowel loop, and a 2nd small midline paraumbilical hernia is seen also containing a single small bowel loop. Presacral soft tissue density remains stable, consistent with post treatment changes. Diverticulosis is again seen involving the transverse and descending colon, without evidence of diverticulitis. No evidence of bowel obstruction, focal inflammatory process, or abscess. Vascular/Lymphatic: New retroperitoneal lymphadenopathy is seen in the left paraaortic region measuring 2.3 cm short axis. No pelvic lymphadenopathy identified. No acute vascular findings. Aortic atherosclerotic calcification noted. Other:  Minimal ascites seen in right paracolic gutter. Musculoskeletal:  No suspicious bone lesions identified. IMPRESSION: New bilateral adrenal masses, consistent with metastatic disease. Bulky multinodular mass-like soft tissue density throughout the right perinephric space and renal sinus, consistent with malignancy. Differential diagnosis includes metastatic disease and lymphoma. New left paraaortic retroperitoneal lymphadenopathy, consistent with malignancy. Differential diagnosis includes metastatic disease and lymphoma. Cirrhosis.  No evidence of hepatic neoplasm. Two small ventral abdominal wall hernias, as described above. Cholelithiasis. No radiographic evidence of cholecystitis. Colonic diverticulosis, without radiographic  evidence of diverticulitis. Aortic Atherosclerosis (ICD10-I70.0). Electronically Signed   By: Marlaine Hind M.D.   On: 11/27/2020 11:35   CT BIOPSY  Result Date: 12/03/2020 INDICATION: History of rectal cancer, now with infiltrative mass within the right  retroperitoneum/pararenal space. Please perform CT-guided biopsy for tissue diagnostic purposes. EXAM: CT-GUIDED BIOPSY OF INFILTRATIVE RIGHT RETROPERITONEAL/PERIRENAL MASS COMPARISON:  CT abdomen and pelvis-11/26/2020 MEDICATIONS: None. ANESTHESIA/SEDATION: Fentanyl 100 mcg IV; Versed 0.5 mg IV Sedation time: 15 minutes; The patient was continuously monitored during the procedure by the interventional radiology nurse under my direct supervision. CONTRAST:  None. COMPLICATIONS: None immediate. PROCEDURE: Informed consent was obtained from the patient and the patient's wife following an explanation of the procedure, risks, benefits and alternatives. A time out was performed prior to the initiation of the procedure. The patient was positioned supine, slightly LPO on the CT gantry and a limited CT was performed for procedural planning demonstrating unchanged size and appearance of the infiltrative mass within the right-side of the retroperitoneum/perirenal space with dominant ill-defined nodular component about the caudal aspect measuring at least 6.8 x 4.6 cm (image 51, series 2). The procedure was planned. The operative site was prepped and draped in the usual sterile fashion. Appropriate trajectory was confirmed with a 22 gauge spinal needle after the adjacent tissues were anesthetized with 1% Lidocaine with epinephrine. Under intermittent CT guidance, a 17 gauge coaxial needle was advanced into the peripheral aspect of the mass. Appropriate positioning was confirmed and a core needle biopsy samples were obtained with an 18 gauge core needle biopsy device. The co-axial needle was removed following administration of a Gel-Foam slurry and superficial hemostasis was achieved with manual compression. A limited postprocedural CT was negative for hemorrhage or additional complication. A dressing was placed. The patient tolerated the procedure well without immediate postprocedural complication. IMPRESSION: Technically  successful CT guided core needle biopsy of infiltrative right retroperitoneal/peri renal mass. Electronically Signed   By: Sandi Mariscal M.D.   On: 12/03/2020 14:39   DG CHEST PORT 1 VIEW  Result Date: 12/03/2020 CLINICAL DATA:  87 y.o. male with history of rectal cancer last treated on 2017-experiencing increasing weakness and poor appetite. Patient was diagnosed with COVID infection about 2 weeks ago was treated with antiviral and p.o. prednisone. EXAM: PORTABLE CHEST - 1 VIEW COMPARISON:  11/22/2020 FINDINGS: Worsening right mid lung and suprahilar interstitial opacities. Coarse peripheral bronchovascular markings bilaterally as before. Heart size and mediastinal contours are within normal limits. Aortic Atherosclerosis (ICD10-170.0). No effusion.  No pneumothorax. Visualized bones unremarkable. IMPRESSION: Interval worsening of right mid lung and suprahilar pulmonary opacities since prior study Electronically Signed   By: Lucrezia Europe M.D.   On: 12/03/2020 11:35   DG Chest Port 1 View  Result Date: 11/22/2020 CLINICAL DATA:  Pt's wife reports pt was diagnosed with Covid on 11/09/20. She reports he was been very weak, had loss of appetite, hacking cough, new BLE swelling, loss of 20lbs. Pt was given antibiotic and steroids when he was diagnosed with Covid EXAM: PORTABLE CHEST 1 VIEW COMPARISON:  01/18/2016 FINDINGS: Cardiac silhouette normal size.  No mediastinal or hilar masses. Prominent interstitial/bronchovascular markings are stable. No evidence of pneumonia or pulmonary edema. No pleural effusion or pneumothorax. Skeletal structures are grossly intact. IMPRESSION: No acute cardiopulmonary disease. Electronically Signed   By: Lajean Manes M.D.   On: 11/22/2020 14:26   DG Swallowing Func-Speech Pathology  Result Date: 12/07/2020 Formatting of this result is different from the original. Objective Swallowing Evaluation: Type  of Study: MBS-Modified Barium Swallow Study  Patient Details Name: Nester Wiencek  MRN: [id number] Date of Birth: 03/22/1926 Today's Date: 12/07/2020 Time: SLP Start Time (ACUTE ONLY): 1210 -SLP Stop Time (ACUTE ONLY): 1235 SLP Time Calculation (min) (ACUTE ONLY): 25 min Past Medical History: Past Medical History: Diagnosis Date  Colorectal cancer ([place]) 2016  Prostate cancer [facility])  Past Surgical History: Past Surgical History: Procedure Laterality Date  COLOSTOMY    MELANOMA EXCISION    ear  PROSTATE SURGERY  1991 HPI: Dionisios Thieu is a 87 y.o. male with history of rectal cancer last treated on 2017-experiencing increasing weakness and poor appetite.  Patient was diagnosed with COVID infection about 2 weeks ago was treated with antiviral and p.o. prednisone.  Subsequent which patient also had come to the ER on June 26 with shortness of breath and at the time patient CT angiogram showed 2 small pulmonary embolism and was placed on apixaban.  The CT scan also showed retroperitoneal lesions concerning for metastatic cancer.  Patient had subsequently followed with [name] patient's oncologist who ordered a CT abdomen pelvis confirms multiple lesions and since patient has been progressively worsening and finding it difficult to ambulate has poor appetite lost at least 20 to 30 pounds in the last month.  Patient admitted for further work-up.  On exam patient appears generally weak otherwise nonfocal.  Has mild swelling of the legs. Chest x-ray with interval worsening of right midlung and suprahilar pulmonary opacities since prior study, concern for possible CAP vs aspiration PNA.  Subjective: Pt seen in radiology for instrumental assessment of swallow function and safety Assessment / Plan / Recommendation CHL IP CLINICAL IMPRESSIONS 12/07/2020 Clinical Impression Pt presents with mild oral, moderate-severe pharyngeal dysphagia, characterized as follows: Orally, pt exhibits poor bolus formation and premature spill. Oral prep is slow and discoordinated. Pharyngeal swallow is characterized by initation of  the swallow reflex at the vallecular sinus most of the time, and at the pyriform sinus intermittently. Post-swallow residue is noted within the vallecular sinus, lateral channels, and pyriform sinus, and increases risk of penetration and aspiration during meals. Penetration was noted on nectar thick liquids during and after the swallow. Aspiration of thin liquids was seen during the swallow, due to poor epiglottic inversion and insufficient airway protection. Npo cough response was elicited after aspiration events. Pt was noted to pull vallecular residue back into the oral cavity on several occasions. Esophageal sweep revealed it to be very slow to clear. Chin tuck and effortful swallow were not effective to clear vallecular residue. At this time, pt is at significantly high risk for aspiration with continued PO intake based on performance on today's instrumental study. Recommend dys 2 diet and nectar thick liquids, with meds crushed in puree. Also recommend Palliative Care consult given multiple comorbidities. RN and MD informed. SLP will follow for education and diet tolerance assessment.  SLP Visit Diagnosis Dysphagia, pharyngoesophageal phase (R13.14)     Impact on safety and function Severe aspiration risk;Risk for inadequate nutrition/hydration   CH[facility] IP TREATMENT RECOMMENDATION 12/07/2020 Treatment Recommendations Therapy as outlined in treatment plan below   Prognosis 12/07/2020 Prognosis for Safe Diet Advancement Fair     [facility] IP DIET RECOMMENDATION 12/07/2020 SLP Diet Recommendations Dysphagia 2 (Fine chop) solids;Nectar thick liquid Liquid Administration via Cup;Straw Medication Administration Crushed with puree Compensations Slow rate;Small sips/bites;Follow solids with liquid Postural Changes Remain semi-upright after after feeds/meals (Comment);Seated upright at 90 degrees   CHL IP OTHER RECOMMENDATIONS 12/07/2020 Recommended Consults Consider esophageal assessment  Oral Care Recommendations Oral care QID  Other Recommendations Order thickener from pharmacy   CH[facility] IP FOLLOW UP RECOMMENDATIONS 12/07/2020 Follow up Recommendations Home health SLP;Skilled Nursing facility   [facility] IP FREQUENCY AND DURATION 12/07/2020 Speech Therapy Frequency (ACUTE ONLY) min 1 x/week Treatment Duration 1 week;2 weeks      CHL IP ORAL PHASE 12/07/2020 Oral Phase Impaired   CH[facility] IP PHARYNGEAL PHASE 12/07/2020 Pharyngeal Phase Impaired   CH[facility] IP CERVICAL ESOPHAGEAL PHASE 12/07/2020 Cervical Esophageal Phase Impaired   Celia B. Quentin Ore, [facility], [place] Speech Language Pathologist Office: [phone] Pager: [phone] Shonna Chock 12/07/2020, 2:05 PM              ECHOCARDIOGRAM COMPLETE  Result Date: 12/11/2020    ECHOCARDIOGRAM REPORT   Patient Name:   TAGGERT CONSTANTINIDES Date of Exam: 12/11/2020 Medical Rec #:  [id number]    Height:       67.0 in Accession #:    [id number]   Weight:       135.4 lb Date of Birth:  [birth year]     BSA:          1.713 m Patient Age:    72 years     BP:           112/64 mmHg Patient Gender: M            HR:           99 bpm. Exam Location:  Inpatient Procedure: 2D Echo, 3D Echo, Cardiac Doppler, Color Doppler and Strain Analysis Indications:    Z51.11 Encounter for antineoplastic chemotheraphy  History:        Patient has no prior history of Echocardiogram examinations.                 Cancer. Pulmonary embolus.  Sonographer:    Roseanna Rainbow RDCS Referring Phys: [place]  Sonographer Comments: Technically difficult study due to poor echo windows, suboptimal parasternal window and suboptimal subcostal window. Global longitudinal strain was attempted. Patient is in an irregular rhythm. IMPRESSIONS  1. Left ventricular ejection fraction, by estimation, is 65 to 70%. The left ventricle has normal function. The left ventricle has no regional wall motion abnormalities. Indeterminate diastolic filling due to E-A fusion.  2. Right ventricular systolic function is normal. The right ventricular size is normal. There is  normal pulmonary artery systolic pressure. The estimated right ventricular systolic pressure is [id number] mmHg.  3. The mitral valve is degenerative. Trivial mitral valve regurgitation. No evidence of mitral stenosis.  4. The aortic valve is tricuspid. There is moderate calcification of the aortic valve. There is moderate thickening of the aortic valve. Aortic valve regurgitation is mild. Mild aortic valve stenosis. Aortic valve area, by VTI measures 1.79 cm. Aortic valve mean gradient measures 12.0 mmHg. Aortic valve Vmax measures 2.40 m/s.  5. The inferior vena cava is normal in size with greater than 50% respiratory variability, suggesting right atrial pressure of 3 mmHg. FINDINGS  Left Ventricle: Left ventricular ejection fraction, by estimation, is 65 to 70%. The left ventricle has normal function. The left ventricle has no regional wall motion abnormalities. Global longitudinal strain performed but not reported based on interpreter judgement due to suboptimal tracking. 3D left ventricular ejection fraction analysis performed but not reported based on interpreter judgement due to suboptimal quality. The left ventricular internal cavity size was normal in size. There is no left ventricular hypertrophy. Indeterminate diastolic filling due to E-A fusion. Right Ventricle: The right ventricular size  is normal. No increase in right ventricular wall thickness. Right ventricular systolic function is normal. There is normal pulmonary artery systolic pressure. The tricuspid regurgitant velocity is 2.37 m/s, and  with an assumed right atrial pressure of 3 mmHg, the estimated right ventricular systolic pressure is [id number] mmHg. Left Atrium: Left atrial size was normal in size. Right Atrium: Right atrial size was normal in size. Pericardium: There is no evidence of pericardial effusion. Mitral Valve: The mitral valve is degenerative in appearance. Mild to moderate mitral annular calcification. Trivial mitral valve regurgitation.  No evidence of mitral valve stenosis. Tricuspid Valve: The tricuspid valve is grossly normal. Tricuspid valve regurgitation is trivial. No evidence of tricuspid stenosis. Aortic Valve: The aortic valve is tricuspid. There is moderate calcification of the aortic valve. There is moderate thickening of the aortic valve. Aortic valve regurgitation is mild. Mild aortic stenosis is present. Aortic valve mean gradient measures 12.0 mmHg. Aortic valve peak gradient measures 23.0 mmHg. Aortic valve area, by VTI measures 1.79 cm. Pulmonic Valve: The pulmonic valve was grossly normal. Pulmonic valve regurgitation is not visualized. No evidence of pulmonic stenosis. Aorta: The aortic root and ascending aorta are structurally normal, with no evidence of dilitation. Venous: The inferior vena cava is normal in size with greater than 50% respiratory variability, suggesting right atrial pressure of 3 mmHg. IAS/Shunts: The interatrial septum was not well visualized.  LEFT VENTRICLE PLAX 2D LVIDd:         2.80 cm     Diastology LVIDs:         2.00 cm     LV e' medial:    5.77 cm/s LV PW:         1.20 cm     LV E/e' medial:  19.3 LV IVS:        1.00 cm     LV e' lateral:   8.05 cm/s LVOT diam:     2.10 cm     LV E/e' lateral: 13.8 LV SV:         80 LV SV Index:   47 LVOT Area:     3.46 cm  LV Volumes (MOD) LV vol d, MOD A2C: 67.6 ml LV vol d, MOD A4C: 68.1 ml LV vol s, MOD A2C: 27.1 ml LV vol s, MOD A4C: 20.6 ml LV SV MOD A2C:     40.5 ml LV SV MOD A4C:     68.1 ml LV SV MOD BP:      44.5 ml RIGHT VENTRICLE             IVC RV S prime:     16.10 cm/s  IVC diam: 1.70 cm TAPSE (M-mode): 1.9 cm LEFT ATRIUM           Index       RIGHT ATRIUM           Index LA diam:      3.80 cm 2.22 cm/m  RA Area:     16.10 cm LA Vol (A4C): 19.4 ml 11.32 ml/m RA Volume:   49.00 ml  28.60 ml/m  AORTIC VALVE AV Area (Vmax):    1.75 cm AV Area (Vmean):   1.70 cm AV Area (VTI):     1.79 cm AV Vmax:           240.00 cm/s AV Vmean:          160.000 cm/s  AV VTI:            0.449 m  AV Peak Grad:      23.0 mmHg AV Mean Grad:      12.0 mmHg LVOT Vmax:         121.00 cm/s LVOT Vmean:        78.700 cm/s LVOT VTI:          0.232 m LVOT/AV VTI ratio: 0.52  AORTA Ao Root diam: 3.50 cm Ao Asc diam:  2.80 cm MITRAL VALVE                TRICUSPID VALVE MV Area (PHT): 3.15 cm     TR Peak grad:   22.5 mmHg MV Decel Time: 241 msec     TR Vmax:        237.00 cm/s MV E velocity: 111.28 cm/s MV A velocity: 118.33 cm/s  SHUNTS MV E/A ratio:  0.94         Systemic VTI:  0.23 m                             Systemic Diam: 2.10 cm Eleonore Chiquito MD Electronically signed by Eleonore Chiquito MD Signature Date/Time: 12/11/2020/4:21:09 PM    Final    [place] EKG SITE RITE  Result Date: 12/09/2020 If Site Rite image not attached, placement could not be confirmed due to current cardiac rhythm.   Microbiology: No results found for this or any previous visit (from the past 240 hour(s)).   Labs: Basic Metabolic Panel: Recent Labs  Lab 12/12/20 0541 12/13/20 0611 12/14/20 0941 12/15/20 0348  NA 135 136 139 138  K 4.4 3.4* 3.4* 3.9  CL 100 100 102 100  CO2 20* 31 32 34*  GLUCOSE 154* 154* 165* 143*  BUN '16 16 14 13  '$ CREATININE 0.49* 0.40* 0.41* <0.30*  CALCIUM 9.5 8.9 8.9 9.0  MG  --   --  2.3 2.4   Liver Function Tests: Recent Labs  Lab 12/12/20 0541 12/13/20 0611 12/14/20 0941 12/15/20 0348  AST 62* 63* 74* 82*  ALT 32 35 46* 56*  ALKPHOS 66 59 69 69  BILITOT 0.8 1.0 1.2 1.4*  PROT 5.7* 5.4* 5.5* 5.5*  ALBUMIN 2.6* 2.4* 2.5* 2.4*   No results for input(s): LIPASE, AMYLASE in the last 168 hours. No results for input(s): AMMONIA in the last 168 hours. CBC: Recent Labs  Lab 12/12/20 0541 12/13/20 0611 12/14/20 0941 12/15/20 0348 12/16/20 1000  WBC 7.4 5.7 5.2 4.6 5.1  NEUTROABS 6.8 5.2 4.7 4.2 4.5  HGB 13.9 13.6 13.9 13.7 13.3  HCT 41.8 40.5 41.1 41.0 39.5  MCV 104.8* 102.5* 104.1* 103.3* 104.8*  PLT 122* 92* 82* 75* 81*   Cardiac Enzymes: No results  for input(s): CKTOTAL, CKMB, CKMBINDEX, TROPONINI in the last 168 hours. BNP: BNP (last 3 results) Recent Labs    11/22/20 1341  BNP 263.0*    ProBNP (last 3 results) No results for input(s): PROBNP in the last 8760 hours.  CBG: Recent Labs  Lab 12/17/20 1143 12/17/20 1632 12/17/20 2205 12/18/20 0750 12/18/20 1146  GLUCAP 154* 161* 128* 130* 143*       Signed:  Annita Brod, MD [facility] 12/18/2020, 4:32 PM

## 2020-12-18 NOTE — Progress Notes (Signed)
Home health are not able to flush PICC line if he is not receiving medication thru it in the home. Will not be able to teach family how to flush either. Scheduling message sent to schedule for lab/PICC flush on 7/25 and family will be taught how to flush. PICC flush MWF until they are comfortable with procedure.

## 2020-12-18 NOTE — TOC Transition Note (Addendum)
Transition of Care [facility]) - CM/SW Discharge Note   Patient Details  Name: Cody Hurley MRN: [id number] Date of Birth: 01-Jan-1926  Transition of Care [facility]) CM/SW Contact:  Dessa Phi, RN Phone Number: 12/18/2020, 4:05 PM   Clinical Narrative:   There is no HHC agency to see patient-spoke to spouse/patient in rm-they r ok with that. d/c home-they will f/u on equipment. I am also f/u on dme-hospital bed;patient does have a bed in home & agree to d/c home. [facility] to be scheduled for 5p pick up.See all prior notes. 4:22p-nsg please fax d/c order to [place] [phone].    Final next level of care: Home/Self Care Barriers to Discharge: No [facility] will accept this patient   Patient Goals and CMS Choice        Discharge Placement                       Discharge Plan and Services In-house Referral: Clinical Social Work              DME Arranged:  (APP-pump & pad for hospital bed-[facility] dme) DME Agency: Other - Comment ([facility] tel#[phone])                  Social Determinants of Health (SDOH) Interventions     Readmission Risk Interventions No flowsheet data found.

## 2020-12-19 NOTE — TOC Transition Note (Signed)
Transition of Care [facility]) - CM/SW Discharge Note   Patient Details  Name: Cody Hurley MRN: [id number] Date of Birth: 02-26-1926  Transition of Care [facility]) CM/SW Contact:  Trish Mage, LCSW Phone Number: 12/19/2020, 9:24 AM   Clinical Narrative:   Patient did not leave yesterday due to [place] transportation issues.  RN has already called [facility] this AM to reinstate request.  Medical necessity form updated for d/c packet.  No further needs identified.  TOC sign off.    Final next level of care: Home/Self Care Barriers to Discharge: No [facility] will accept this patient   Patient Goals and CMS Choice        Discharge Placement                       Discharge Plan and Services In-house Referral: Clinical Social Work              DME Arranged:  (APP-pump & pad for hospital bed-[facility] dme) DME Agency: Other - Comment ([facility] tel#[phone])                  Social Determinants of Health (SDOH) Interventions     Readmission Risk Interventions No flowsheet data found.

## 2020-12-21 ENCOUNTER — Inpatient Hospital Stay: Payer: Medicare Other

## 2020-12-21 ENCOUNTER — Telehealth: Payer: Self-pay | Admitting: *Deleted

## 2020-12-21 NOTE — Telephone Encounter (Signed)
Spoke w/wife re: appointment today. She is not able to get him here--their ambulance transport will not bring him to [facility] and she is not able to get him in the car right now. He is still very weak and he has two small steps to get out of the house. Would like to reschedule for Thursday at ~ 1 pm--her friend, Butch Penny who has worked with a PICC before will be available to learn procedure and help get him in the car. Per [name]: OK to schedule for Thursday. Scheduling message sent. Wife requests all appointments to be on Wednesday or Thursday when Butch Penny is there to help her.  Suggested she reach out to her church mission team to see if they could build a small ramp to get out of the house easily. She agrees to try this. The local home health agency is out of network--she reports [facility] in [place] is in network and is asking if office can assist with this for physical therapy and a nurse to check on him weekly and assist with bath and colostomy care and change the PICC dressing weekly when not at an office appointment. Contacted agency and obtained fax number and referral faxed w/insurance, demographic and d/c summary included.

## 2020-12-22 ENCOUNTER — Inpatient Hospital Stay: Payer: Medicare Other | Admitting: Oncology

## 2020-12-22 ENCOUNTER — Telehealth: Payer: Self-pay | Admitting: *Deleted

## 2020-12-22 NOTE — Telephone Encounter (Signed)
Call from home health agency. He is in network for them and they can provide nurse weekly to assess and monitor colostomy and an aid to bathe him. Also can provide home PT. Will not be able to flush PICC or change dressing since he is not receiving therapy in home. Will reach out to wife and try to get 1st visit for Friday this week.

## 2020-12-24 ENCOUNTER — Other Ambulatory Visit: Payer: Self-pay

## 2020-12-24 ENCOUNTER — Inpatient Hospital Stay: Payer: Medicare Other

## 2020-12-24 ENCOUNTER — Telehealth: Payer: Self-pay | Admitting: *Deleted

## 2020-12-24 VITALS — BP 123/57 | HR 88 | Temp 98.0°F | Resp 18

## 2020-12-24 DIAGNOSIS — C20 Malignant neoplasm of rectum: Secondary | ICD-10-CM

## 2020-12-24 LAB — CBC WITH DIFFERENTIAL (CANCER CENTER ONLY)
Abs Immature Granulocytes: 0 10*3/uL (ref 0.00–0.07)
Basophils Absolute: 0 10*3/uL (ref 0.0–0.1)
Basophils Relative: 2 %
Eosinophils Absolute: 0.1 10*3/uL (ref 0.0–0.5)
Eosinophils Relative: 8 %
HCT: 35.5 % — ABNORMAL LOW (ref 39.0–52.0)
Hemoglobin: 11.6 g/dL — ABNORMAL LOW (ref 13.0–17.0)
Immature Granulocytes: 0 %
Lymphocytes Relative: 24 %
Lymphs Abs: 0.4 10*3/uL — ABNORMAL LOW (ref 0.7–4.0)
MCH: 34 pg (ref 26.0–34.0)
MCHC: 32.7 g/dL (ref 30.0–36.0)
MCV: 104.1 fL — ABNORMAL HIGH (ref 80.0–100.0)
Monocytes Absolute: 0.4 10*3/uL (ref 0.1–1.0)
Monocytes Relative: 29 %
Neutro Abs: 0.6 10*3/uL — ABNORMAL LOW (ref 1.7–7.7)
Neutrophils Relative %: 37 %
Platelet Count: 82 10*3/uL — ABNORMAL LOW (ref 150–400)
RBC: 3.41 MIL/uL — ABNORMAL LOW (ref 4.22–5.81)
RDW: 15.9 % — ABNORMAL HIGH (ref 11.5–15.5)
WBC Count: 1.5 10*3/uL — ABNORMAL LOW (ref 4.0–10.5)
nRBC: 0 % (ref 0.0–0.2)

## 2020-12-24 MED ORDER — SODIUM CHLORIDE FLUSH 0.9 % IV SOLN: 10.0000 mL | INTRAVENOUS | 2 refills | Status: AC

## 2020-12-24 MED ORDER — LEVOFLOXACIN 500 MG PO TABS: 500.0000 mg | ORAL_TABLET | Freq: Every day | ORAL | 0 refills | Status: DC

## 2020-12-24 MED ORDER — SODIUM CHLORIDE 0.9% FLUSH
10.0000 mL | Freq: Once | INTRAVENOUS | Status: AC
  Administered 2020-12-24: 10 mL via INTRAVENOUS
  Filled 2020-12-24: qty 10

## 2020-12-24 MED ORDER — HEPARIN SOD (PORK) LOCK FLUSH 100 UNIT/ML IV SOLN
500.0000 [IU] | Freq: Once | INTRAVENOUS | Status: DC
  Filled 2020-12-24: qty 5

## 2020-12-24 MED ORDER — HEPARIN SOD (PORK) LOCK FLUSH 100 UNIT/ML IV SOLN: 250.0000 [IU] | INTRAVENOUS | 2 refills | Status: AC

## 2020-12-24 MED ORDER — HEPARIN SOD (PORK) LOCK FLUSH 100 UNIT/ML IV SOLN
250.0000 [IU] | Freq: Once | INTRAVENOUS | Status: AC
  Administered 2020-12-24: 250 [IU] via INTRAVENOUS
  Filled 2020-12-24: qty 5

## 2020-12-24 MED ORDER — SODIUM CHLORIDE 0.9 % IV SOLN
INTRAVENOUS | Status: DC
  Filled 2020-12-24: qty 250

## 2020-12-24 NOTE — Telephone Encounter (Signed)
MD reviewed CBC results and has ordered Levaquin 500 mg daily x 7 days. Also confirmed w/[facility] in [place] that they will be calling this evening to arrange for 1st visit tomorrow. [facility] in [place]; [facility] and all do not supply PICC flush supplies. Confirmed w/[facility] that their specialty pharmacy does. Orders sent. Left VM for wife with the above information as well as to call for fever, chills or any s/s of infection.

## 2020-12-24 NOTE — Patient Instructions (Signed)
[facility]  Discharge Instructions: Thank you for choosing [facility] to provide your oncology and hematology care.   If you have a lab appointment with the [facility], please go directly to the [facility] and check in at the registration area.   Wear comfortable clothing and clothing appropriate for easy access to any Portacath or PICC line.   We strive to give you quality time with your provider. You may need to reschedule your appointment if you arrive late (15 or more minutes).  Arriving late affects you and other patients whose appointments are after yours.  Also, if you miss three or more appointments without notifying the office, you may be dismissed from the clinic at the provider's discretion.      For prescription refill requests, have your pharmacy contact our office and allow 72 hours for refills to be completed.    Today you had your PICC line dressing changed and the PICC line flushed.   For at home PICC line flushing:  1) Clean the end cap of the purple tubing with alcohol pad - scrubbing for 15 seconds  2) Attach the 0.9% sodium chloride syringe to the purple line unclamp (open) the clamp push 1 -2 ml in and then pull back to check for a blood return after blood return noticed, push the entire syringe (57m) into the purple tubing; please note that if a blood return is not noticed and the tubing flushes easily - that is okay. Please just notify [place] your next visit that it has not given blood.  disconnect and set empty syringe aside (throw away)  3) Make sure the heparin lock flush is only 2.5 ml (250 units) attach the heparin lock flush syringe to the purple tubing and push in the 2.561m(250 units) of heparin Disconnect and set empty syringe aside (throw away) Clamp (close) the clamp   4) Repeat steps 1-3 for the red tubing     BELOW ARE SYMPTOMS THAT SHOULD BE REPORTED IMMEDIATELY: *FEVER GREATER THAN 100.4 F (38  C) OR HIGHER *CHILLS OR SWEATING *NAUSEA AND VOMITING THAT IS NOT CONTROLLED WITH YOUR NAUSEA MEDICATION *UNUSUAL SHORTNESS OF BREATH *UNUSUAL BRUISING OR BLEEDING *URINARY PROBLEMS (pain or burning when urinating, or frequent urination) *BOWEL PROBLEMS (unusual diarrhea, constipation, pain near the anus) TENDERNESS IN MOUTH AND THROAT WITH OR WITHOUT PRESENCE OF ULCERS (sore throat, sores in mouth, or a toothache) UNUSUAL RASH, SWELLING OR PAIN  UNUSUAL VAGINAL DISCHARGE OR ITCHING   Items with * indicate a potential emergency and should be followed up as soon as possible or go to the Emergency Department if any problems should occur.  Please show the CHEMOTHERAPY ALERT CARD or IMMUNOTHERAPY ALERT CARD at check-in to the Emergency Department and triage nurse.  Should you have questions after your visit or need to cancel or reschedule your appointment, please contact [facility]pt: [phone] follow the prompts.  Office hours are 8:00 a.m. to 4:30 p.m. Monday - Friday. Please note that voicemails left after 4:00 p.m. may not be returned until the following business day.  We are closed weekends and major holidays. You have access to a nurse at all times for urgent questions. Please call the main number to the clinic Dept: [phone] follow the prompts.   For any non-urgent questions, you may also contact your provider using MyChart. We now offer e-Visits for anyone 1867nd older to request  care online for non-urgent symptoms. For details visit [email or web address].   Also download the MyChart app! Go to the app store, search "MyChart", open the app, select [facility], and log in with your MyChart username and password.  Due to Covid, a mask is required upon entering the hospital/clinic. If you do not have a mask, one will be given to you upon arrival. For doctor visits, patients may have 1 support person aged 33 or older with them. For treatment  visits, patients cannot have anyone with them due to current Covid guidelines and our immunocompromised population.

## 2020-12-24 NOTE — Progress Notes (Signed)
Patient presented today for a picc line drsg change and picc flush. Patient has a double lumen picc. Family friend and wife present at visit. Family friend to flush picc line double lumen weekly on Mondays. Procedure reviewed with caregiver and caregiver successfully flushed both lumens with saline and heparin. D/C instructions given that included the steps to follow for PICC line double lumen flush. Caregiver has done this in the past and feels confident in her ability to flush the PICC weekly. Again, caregiver flushed the PICC line independently with little direction and maintained a clean work environment.

## 2020-12-25 ENCOUNTER — Other Ambulatory Visit: Payer: Self-pay | Admitting: Oncology

## 2020-12-30 ENCOUNTER — Other Ambulatory Visit: Payer: Self-pay | Admitting: *Deleted

## 2020-12-30 ENCOUNTER — Other Ambulatory Visit: Payer: Self-pay | Admitting: Oncology

## 2020-12-30 NOTE — Progress Notes (Signed)
Was instructed to call [facility]  at [phone] for heparin flush--they do not carry this med. Suggested to call [facility] at [phone] and heparin is not covered under his drug plan. [facility] in [place], [place] not able to order heparin. [facility] in [place], [place] also not able to supply heparin. Also tried multiple pharmacies and medical supply companies with negative response. [facility] will need to provide heparin for home use.

## 2020-12-31 ENCOUNTER — Inpatient Hospital Stay: Payer: Medicare Other

## 2020-12-31 ENCOUNTER — Encounter: Payer: Self-pay | Admitting: Nurse Practitioner

## 2020-12-31 ENCOUNTER — Other Ambulatory Visit: Payer: Self-pay

## 2020-12-31 ENCOUNTER — Inpatient Hospital Stay: Payer: Medicare Other | Attending: Oncology

## 2020-12-31 ENCOUNTER — Inpatient Hospital Stay: Payer: Medicare Other | Admitting: Nurse Practitioner

## 2020-12-31 VITALS — BP 107/52 | HR 84 | Temp 98.1°F | Resp 19 | Ht 67.0 in | Wt 135.0 lb

## 2020-12-31 VITALS — BP 128/68 | HR 79 | Temp 97.7°F | Resp 18

## 2020-12-31 DIAGNOSIS — C8593 Non-Hodgkin lymphoma, unspecified, intra-abdominal lymph nodes: Secondary | ICD-10-CM | POA: Diagnosis not present

## 2020-12-31 DIAGNOSIS — Z85048 Personal history of other malignant neoplasm of rectum, rectosigmoid junction, and anus: Secondary | ICD-10-CM | POA: Insufficient documentation

## 2020-12-31 DIAGNOSIS — C8590 Non-Hodgkin lymphoma, unspecified, unspecified site: Secondary | ICD-10-CM | POA: Insufficient documentation

## 2020-12-31 DIAGNOSIS — C20 Malignant neoplasm of rectum: Secondary | ICD-10-CM

## 2020-12-31 DIAGNOSIS — Z5111 Encounter for antineoplastic chemotherapy: Secondary | ICD-10-CM | POA: Diagnosis present

## 2020-12-31 DIAGNOSIS — Z8546 Personal history of malignant neoplasm of prostate: Secondary | ICD-10-CM | POA: Insufficient documentation

## 2020-12-31 DIAGNOSIS — Z8673 Personal history of transient ischemic attack (TIA), and cerebral infarction without residual deficits: Secondary | ICD-10-CM | POA: Insufficient documentation

## 2020-12-31 DIAGNOSIS — Z79899 Other long term (current) drug therapy: Secondary | ICD-10-CM | POA: Insufficient documentation

## 2020-12-31 DIAGNOSIS — D6959 Other secondary thrombocytopenia: Secondary | ICD-10-CM | POA: Insufficient documentation

## 2020-12-31 DIAGNOSIS — Z5112 Encounter for antineoplastic immunotherapy: Secondary | ICD-10-CM | POA: Diagnosis present

## 2020-12-31 DIAGNOSIS — C8513 Unspecified B-cell lymphoma, intra-abdominal lymph nodes: Secondary | ICD-10-CM

## 2020-12-31 LAB — CMP (CANCER CENTER ONLY)
ALT: 35 U/L (ref 0–44)
AST: 44 U/L — ABNORMAL HIGH (ref 15–41)
Albumin: 3.1 g/dL — ABNORMAL LOW (ref 3.5–5.0)
Alkaline Phosphatase: 115 U/L (ref 38–126)
Anion gap: 6 (ref 5–15)
BUN: 10 mg/dL (ref 8–23)
CO2: 28 mmol/L (ref 22–32)
Calcium: 8.9 mg/dL (ref 8.9–10.3)
Chloride: 107 mmol/L (ref 98–111)
Creatinine: 0.42 mg/dL — ABNORMAL LOW (ref 0.61–1.24)
GFR, Estimated: >60 mL/min (ref >60)
Glucose, Bld: 156 mg/dL — ABNORMAL HIGH (ref 70–99)
Potassium: 3.3 mmol/L — ABNORMAL LOW (ref 3.5–5.1)
Sodium: 141 mmol/L (ref 135–145)
Total Bilirubin: 1 mg/dL (ref 0.3–1.2)
Total Protein: 6 g/dL — ABNORMAL LOW (ref 6.5–8.1)

## 2020-12-31 LAB — CBC WITH DIFFERENTIAL (CANCER CENTER ONLY)
Abs Immature Granulocytes: 0.02 10*3/uL (ref 0.00–0.07)
Basophils Absolute: 0.1 10*3/uL (ref 0.0–0.1)
Basophils Relative: 2 %
Eosinophils Absolute: 0.1 10*3/uL (ref 0.0–0.5)
Eosinophils Relative: 4 %
HCT: 35.9 % — ABNORMAL LOW (ref 39.0–52.0)
Hemoglobin: 11.9 g/dL — ABNORMAL LOW (ref 13.0–17.0)
Immature Granulocytes: 1 %
Lymphocytes Relative: 16 %
Lymphs Abs: 0.5 10*3/uL — ABNORMAL LOW (ref 0.7–4.0)
MCH: 34.3 pg — ABNORMAL HIGH (ref 26.0–34.0)
MCHC: 33.1 g/dL (ref 30.0–36.0)
MCV: 103.5 fL — ABNORMAL HIGH (ref 80.0–100.0)
Monocytes Absolute: 0.6 10*3/uL (ref 0.1–1.0)
Monocytes Relative: 19 %
Neutro Abs: 1.8 10*3/uL (ref 1.7–7.7)
Neutrophils Relative %: 58 %
Platelet Count: 132 10*3/uL — ABNORMAL LOW (ref 150–400)
RBC: 3.47 MIL/uL — ABNORMAL LOW (ref 4.22–5.81)
RDW: 15.4 % (ref 11.5–15.5)
WBC Count: 3 10*3/uL — ABNORMAL LOW (ref 4.0–10.5)
nRBC: 0 % (ref 0.0–0.2)

## 2020-12-31 LAB — LACTATE DEHYDROGENASE: LDH: 247 U/L — ABNORMAL HIGH (ref 98–192)

## 2020-12-31 MED ORDER — DIPHENHYDRAMINE HCL 25 MG PO CAPS
25.0000 mg | ORAL_CAPSULE | Freq: Once | ORAL | Status: AC
  Administered 2020-12-31: 25 mg via ORAL
  Filled 2020-12-31: qty 1

## 2020-12-31 MED ORDER — SODIUM CHLORIDE 0.9 % IV SOLN: 8.0000 mg | Freq: Once | INTRAVENOUS | Status: DC

## 2020-12-31 MED ORDER — ONDANSETRON HCL 4 MG/2ML IJ SOLN
8.0000 mg | Freq: Once | INTRAMUSCULAR | Status: AC
  Administered 2020-12-31: 8 mg via INTRAVENOUS
  Filled 2020-12-31: qty 4

## 2020-12-31 MED ORDER — SODIUM CHLORIDE 0.9 % IV SOLN
1020.0000 mg | Freq: Once | INTRAVENOUS | Status: AC
  Administered 2020-12-31: 1020 mg via INTRAVENOUS
  Filled 2020-12-31: qty 50

## 2020-12-31 MED ORDER — SODIUM CHLORIDE 0.9 % IV SOLN
Freq: Once | INTRAVENOUS | Status: AC
  Filled 2020-12-31: qty 250

## 2020-12-31 MED ORDER — HEPARIN SOD (PORK) LOCK FLUSH 100 UNIT/ML IV SOLN
250.0000 [IU] | Freq: Once | INTRAVENOUS | Status: AC | PRN
  Administered 2020-12-31: 500 [IU]
  Filled 2020-12-31: qty 5

## 2020-12-31 MED ORDER — SODIUM CHLORIDE 0.9 % IV SOLN
10.0000 mg | Freq: Once | INTRAVENOUS | Status: AC
  Administered 2020-12-31: 10 mg via INTRAVENOUS
  Filled 2020-12-31: qty 10

## 2020-12-31 MED ORDER — ACETAMINOPHEN 325 MG PO TABS
650.0000 mg | ORAL_TABLET | Freq: Once | ORAL | Status: AC
  Administered 2020-12-31: 650 mg via ORAL
  Filled 2020-12-31: qty 2

## 2020-12-31 MED ORDER — SODIUM CHLORIDE 0.9 % IV SOLN
600.0000 mg | Freq: Once | INTRAVENOUS | Status: AC
  Administered 2020-12-31: 600 mg via INTRAVENOUS
  Filled 2020-12-31: qty 50

## 2020-12-31 MED ORDER — SODIUM CHLORIDE 0.9% FLUSH
10.0000 mL | INTRAVENOUS | Status: DC | PRN
Start: 2020-12-31 — End: 2020-12-31
  Administered 2020-12-31: 10 mL
  Filled 2020-12-31: qty 10

## 2020-12-31 MED ORDER — VINCRISTINE SULFATE CHEMO INJECTION 1 MG/ML
1.0000 mg | Freq: Once | INTRAVENOUS | Status: AC
  Administered 2020-12-31: 1 mg via INTRAVENOUS
  Filled 2020-12-31: qty 1

## 2020-12-31 NOTE — Patient Instructions (Addendum)
Cody Hurley  Discharge Instructions: Thank you for choosing [facility] to provide your oncology and hematology care.   If you have a lab appointment with the [facility], please go directly to the [facility] and check in at the registration area.   Wear comfortable clothing and clothing appropriate for easy access to any Portacath or PICC line.    For labs: [facility] to [facility] in [place], [place]                 Appointment date 01/11/2021 @ 1pm                 Phone # [phone]                 You will need to take the prescription for labwork  White blood cell booster shot to be given at [facility]) on Friday 01/01/21 @ 1pm. Stop by the Main Entrance Registration Desk and let them know you are there for your [place] appointment and they will send you on up to the 4th floor.   PICC line dressing changes once a week at [facility]) on the 4th floor starting Friday 01/08/21 @ 11am. Stop by the Main Entrance Registration Desk and let them know you are there for your [place] appointment and they will send you on up to the 4th floor.     We strive to give you quality time with your provider. You may need to reschedule your appointment if you arrive late (15 or more minutes).  Arriving late affects you and other patients whose appointments are after yours.  Also, if you miss three or more appointments without notifying the office, you may be dismissed from the clinic at the provider's discretion.      For prescription refill requests, have your pharmacy contact our office and allow 72 hours for refills to be completed.    Today you received the following chemotherapy and/or immunotherapy agents: vincristine, cyclophosphamide, rituximab.    To help prevent nausea and vomiting after your treatment, we encourage you to take your nausea medication as  directed.  BELOW ARE SYMPTOMS THAT SHOULD BE REPORTED IMMEDIATELY: *FEVER GREATER THAN 100.4 F (38 C) OR HIGHER *CHILLS OR SWEATING *NAUSEA AND VOMITING THAT IS NOT CONTROLLED WITH YOUR NAUSEA MEDICATION *UNUSUAL SHORTNESS OF BREATH *UNUSUAL BRUISING OR BLEEDING *URINARY PROBLEMS (pain or burning when urinating, or frequent urination) *BOWEL PROBLEMS (unusual diarrhea, constipation, pain near the anus) TENDERNESS IN MOUTH AND THROAT WITH OR WITHOUT PRESENCE OF ULCERS (sore throat, sores in mouth, or a toothache) UNUSUAL RASH, SWELLING OR PAIN  UNUSUAL VAGINAL DISCHARGE OR ITCHING   Items with * indicate a potential emergency and should be followed up as soon as possible or go to the Emergency Department if any problems should occur.  Please show the CHEMOTHERAPY ALERT CARD or IMMUNOTHERAPY ALERT CARD at check-in to the Emergency Department and triage nurse.  Should you have questions after your visit or need to cancel or reschedule your appointment, please contact [facility]  Dept: [phone]  and follow the prompts.  Office hours are 8:00 a.m. to 4:30 p.m. Monday - Friday. Please note that voicemails left after 4:00 p.m. may not be returned until the following business day.  We are  closed weekends and major holidays. You have access to a nurse at all times for urgent questions. Please call the main number to the clinic Dept: [phone] and follow the prompts.   For any non-urgent questions, you may also contact your provider using MyChart. We now offer e-Visits for anyone 28 and older to request care online for non-urgent symptoms. For details visit [email or web address].   Also download the MyChart app! Go to the app store, search "MyChart", open the app, select [facility], and log in with your MyChart username and password.  Due to Covid, a mask is required upon entering the hospital/clinic. If you do not have a mask, one will be given to you upon  arrival. For doctor visits, patients may have 1 support person aged 56 or older with them. For treatment visits, patients cannot have anyone with them due to current Covid guidelines and our immunocompromised population.   Vincristine injection What is this medication? VINCRISTINE (vin KRIS teen) is a chemotherapy drug. It slows the growth of cancer cells. This medicine is used to treat many types of cancer like Hodgkin's disease, leukemia, non-Hodgkin's lymphoma, neuroblastoma (braincancer), rhabdomyosarcoma, and Wilms' tumor. This medicine may be used for other purposes; ask your health care provider orpharmacist if you have questions. COMMON BRAND NAME(S): Oncovin, Vincasar PFS What should I tell my care team before I take this medication? They need to know if you have any of these conditions: blood disorders gout infection (especially chickenpox, cold sores, or herpes) kidney disease liver disease lung disease nervous system disease like Charcot-Marie-Tooth (CMT) recent or ongoing radiation therapy an unusual or allergic reaction to vincristine, other chemotherapy agents, other medicines, foods, dyes, or preservatives pregnant or trying to get pregnant breast-feeding How should I use this medication? This drug is given as an infusion into a vein. It is administered in a hospital or clinic by a specially trained health care professional. If you have pain, swelling, burning, or any unusual feeling around the site of your injection,tell your health care professional right away. Talk to your pediatrician regarding the use of this medicine in children. Whilethis drug may be prescribed for selected conditions, precautions do apply. Overdosage: If you think you have taken too much of this medicine contact apoison control center or emergency room at once. NOTE: This medicine is only for you. Do not share this medicine with others. What if I miss a dose? It is important not to miss your dose.  Call your doctor or health careprofessional if you are unable to keep an appointment. What may interact with this medication? certain medicines for fungal infections like itraconazole, ketoconazole, posaconazole, voriconazole certain medicines for seizures like phenytoin This list may not describe all possible interactions. Give your health care provider a list of all the medicines, herbs, non-prescription drugs, or dietary supplements you use. Also tell them if you smoke, drink alcohol, or use illegaldrugs. Some items may interact with your medicine. What should I watch for while using this medication? This drug may make you feel generally unwell. This is not uncommon, as chemotherapy can affect healthy cells as well as cancer cells. Report any side effects. Continue your course of treatment even though you feel ill unless yourdoctor tells you to stop. You may need blood work done while you are taking this medicine. This medicine will cause constipation. Try to have a bowel movement at least every 2 to 3 days. If you do not have a bowel movement for 3 days, call yourdoctor or  health care professional. In some cases, you may be given additional medicines to help with side effects.Follow all directions for their use. Do not become pregnant while taking this medicine. Women should inform their doctor if they wish to become pregnant or think they might be pregnant. There is a potential for serious side effects to an unborn child. Talk to your health care professional or pharmacist for more information. Do not breast-feed aninfant while taking this medicine. This medicine may make it more difficult to get pregnant or to father a child.Talk to your healthcare professional if you are concerned about your fertility. What side effects may I notice from receiving this medication? Side effects that you should report to your doctor or health care professionalas soon as possible: allergic reactions like skin rash,  itching or hives, swelling of the face, lips, or tongue breathing problems confusion or changes in emotions or moods constipation cough mouth sores muscle weakness nausea and vomiting pain, swelling, redness or irritation at the injection site pain, tingling, numbness in the hands or feet problems with balance, talking, walking seizures stomach pain trouble passing urine or change in the amount of urine Side effects that usually do not require medical attention (report to yourdoctor or health care professional if they continue or are bothersome): diarrhea hair loss jaw pain loss of appetite This list may not describe all possible side effects. Call your doctor for medical advice about side effects. You may report side effects to FDA at1-800-FDA-1088. Where should I keep my medication? This drug is given in a hospital or clinic and will not be stored at home. NOTE: This sheet is a summary. It may not cover all possible information. If you have questions about this medicine, talk to your doctor, pharmacist, orhealth care provider.  2022 Elsevier/Gold Standard (2019-04-16 17:05:13)  Cyclophosphamide Injection What is this medication? CYCLOPHOSPHAMIDE (sye kloe FOSS fa mide) is a chemotherapy drug. It slows the growth of cancer cells. This medicine is used to treat many types of cancer like lymphoma, myeloma, leukemia, breast cancer, and ovarian cancer, to name afew. This medicine may be used for other purposes; ask your health care provider orpharmacist if you have questions. COMMON BRAND NAME(S): Cytoxan, Neosar What should I tell my care team before I take this medication? They need to know if you have any of these conditions: heart disease history of irregular heartbeat infection kidney disease liver disease low blood counts, like white cells, platelets, or red blood cells on hemodialysis recent or ongoing radiation therapy scarring or thickening of the lungs trouble passing  urine an unusual or allergic reaction to cyclophosphamide, other medicines, foods, dyes, or preservatives pregnant or trying to get pregnant breast-feeding How should I use this medication? This drug is usually given as an injection into a vein or muscle or by infusion into a vein. It is administered in a hospital or clinic by a specially trainedhealth care professional. Talk to your pediatrician regarding the use of this medicine in children.Special care may be needed. Overdosage: If you think you have taken too much of this medicine contact apoison control center or emergency room at once. NOTE: This medicine is only for you. Do not share this medicine with others. What if I miss a dose? It is important not to miss your dose. Call your doctor or health careprofessional if you are unable to keep an appointment. What may interact with this medication? amphotericin B azathioprine certain antivirals for HIV or hepatitis certain medicines for blood pressure,  heart disease, irregular heart beat certain medicines that treat or prevent blood clots like warfarin certain other medicines for cancer cyclosporine etanercept indomethacin medicines that relax muscles for surgery medicines to increase blood counts metronidazole This list may not describe all possible interactions. Give your health care provider a list of all the medicines, herbs, non-prescription drugs, or dietary supplements you use. Also tell them if you smoke, drink alcohol, or use illegaldrugs. Some items may interact with your medicine. What should I watch for while using this medication? Your condition will be monitored carefully while you are receiving thismedicine. You may need blood work done while you are taking this medicine. Drink water or other fluids as directed. Urinate often, even at night. Some products may contain alcohol. Ask your health care professional if this medicine contains alcohol. Be sure to tell all health  care professionals you are taking this medicine. Certain medicines, like metronidazole and disulfiram, can cause an unpleasant reaction when taken with alcohol. The reaction includes flushing, headache, nausea, vomiting, sweating, and increased thirst. Thereaction can last from 30 minutes to several hours. Do not become pregnant while taking this medicine or for 1 year after stopping it. Women should inform their health care professional if they wish to become pregnant or think they might be pregnant. Men should not father a child while taking this medicine and for 4 months after stopping it. There is potential for serious side effects to an unborn child. Talk to your health care professionalfor more information. Do not breast-feed an infant while taking this medicine or for 1 week afterstopping it. This medicine has caused ovarian failure in some women. This medicine may make it more difficult to get pregnant. Talk to your health care professional if [place] concerned about your fertility. This medicine has caused decreased sperm counts in some men. This may make it more difficult to father a child. Talk to your health care professional if [place] concerned about your fertility. Call your health care professional for advice if you get a fever, chills, or sore throat, or other symptoms of a cold or flu. Do not treat yourself. This medicine decreases your body's ability to fight infections. Try to avoid beingaround people who are sick. Avoid taking medicines that contain aspirin, acetaminophen, ibuprofen, naproxen, or ketoprofen unless instructed by your health care professional.These medicines may hide a fever. Talk to your health care professional about your risk of cancer. You may bemore at risk for certain types of cancer if you take this medicine. If you are going to need surgery or other procedure, tell your health careprofessional that you are using this medicine. Be careful brushing or flossing your  teeth or using a toothpick because you may get an infection or bleed more easily. If you have any dental work done, Primary school teacher you are receiving this medicine. What side effects may I notice from receiving this medication? Side effects that you should report to your doctor or health care professionalas soon as possible: allergic reactions like skin rash, itching or hives, swelling of the face, lips, or tongue breathing problems nausea, vomiting signs and symptoms of bleeding such as bloody or black, tarry stools; red or dark brown urine; spitting up blood or brown material that looks like coffee grounds; red spots on the skin; unusual bruising or bleeding from the eyes, gums, or nose signs and symptoms of heart failure like fast, irregular heartbeat, sudden weight gain; swelling of the ankles, feet, hands signs and symptoms of infection like fever;  chills; cough; sore throat; pain or trouble passing urine signs and symptoms of kidney injury like trouble passing urine or change in the amount of urine signs and symptoms of liver injury like dark yellow or brown urine; general ill feeling or flu-like symptoms; light-colored stools; loss of appetite; nausea; right upper belly pain; unusually weak or tired; yellowing of the eyes or skin Side effects that usually do not require medical attention (report to yourdoctor or health care professional if they continue or are bothersome): confusion decreased hearing diarrhea facial flushing hair loss headache loss of appetite missed menstrual periods signs and symptoms of low red blood cells or anemia such as unusually weak or tired; feeling faint or lightheaded; falls skin discoloration This list may not describe all possible side effects. Call your doctor for medical advice about side effects. You may report side effects to FDA at1-800-FDA-1088. Where should I keep my medication? This drug is given in a hospital or clinic and will not be stored at  home. NOTE: This sheet is a summary. It may not cover all possible information. If you have questions about this medicine, talk to your doctor, pharmacist, orhealth care provider.  2022 Elsevier/Gold Standard (2019-02-18 09:53:29)  Rituximab Injection What is this medication? RITUXIMAB (ri TUX i mab) is a monoclonal antibody. It is used to treat certain types of cancer like non-Hodgkin lymphoma and chronic lymphocytic leukemia. It is also used to treat rheumatoid arthritis, granulomatosis with polyangiitis,microscopic polyangiitis, and pemphigus vulgaris. This medicine may be used for other purposes; ask your health care provider orpharmacist if you have questions. COMMON BRAND NAME(S): RIABNI, Rituxan, RUXIENCE What should I tell my care team before I take this medication? They need to know if you have any of these conditions: chest pain heart disease infection especially a viral infection such as chickenpox, cold sores, hepatitis B, or herpes immune system problems irregular heartbeat or rhythm kidney disease low blood counts (white cells, platelets, or red cells) lung disease recent or upcoming vaccine an unusual or allergic reaction to rituximab, other medicines, foods, dyes, or preservatives pregnant or trying to get pregnant breast-feeding How should I use this medication? This medicine is injected into a vein. It is given by a health care provider ina hospital or clinic setting. A special MedGuide will be given to you before each treatment. Be sure to readthis information carefully each time. Talk to your health care provider about the use of this medicine in children. While this drug may be prescribed for children as young as 6 months forselected conditions, precautions do apply. Overdosage: If you think you have taken too much of this medicine contact apoison control center or emergency room at once. NOTE: This medicine is only for you. Do not share this medicine with  others. What if I miss a dose? Keep appointments for follow-up doses. It is important not to miss your dose.Call your health care provider if you are unable to keep an appointment. What may interact with this medication? Do not take this medicine with any of the following medicines: live vaccines This medicine may also interact with the following medicines: cisplatin This list may not describe all possible interactions. Give your health care provider a list of all the medicines, herbs, non-prescription drugs, or dietary supplements you use. Also tell them if you smoke, drink alcohol, or use illegaldrugs. Some items may interact with your medicine. What should I watch for while using this medication? Your condition will be monitored carefully while you are receiving  thismedicine. You may need blood work done while you are taking this medicine. This medicine can cause serious infusion reactions. To reduce the risk your health care provider may give you other medicines to take before receiving thisone. Be sure to follow the directions from your health care provider. This medicine may increase your risk of getting an infection. Call your health care provider for advice if you get a fever, chills, sore throat, or other symptoms of a cold or flu. Do not treat yourself. Try to avoid being aroundpeople who are sick. Call your health care provider if you are around anyone with measles,chickenpox, or if you develop sores or blisters that do not heal properly. Avoid taking medicines that contain aspirin, acetaminophen, ibuprofen, naproxen, or ketoprofen unless instructed by your health care provider. Thesemedicines may hide a fever. This medicine may cause serious skin reactions. They can happen weeks to months after starting the medicine. Contact your health care provider right away if you notice fevers or flu-like symptoms with a rash. The rash may be red or purple and then turn into blisters or peeling of the  skin. Or, you might notice a red rash with swelling of the face, lips or lymph nodes in your neck or underyour arms. In some patients, this medicine may cause a serious brain infection that may cause death. If you have any problems seeing, thinking, speaking, walking, or standing, tell your healthcare professional right away. If you cannot reachyour healthcare professional, urgently seek other source of medical care. Do not become pregnant while taking this medicine or for at least 12 months after stopping it. Women should inform their health care provider if they wish to become pregnant or think they might be pregnant. There is potential for serious harm to an unborn child. Talk to your health care provider for more information. Women should use a reliable form of birth control while taking this medicine and for 12 months after stopping it. Do not breast-feed whiletaking this medicine or for at least 6 months after stopping it. What side effects may I notice from receiving this medication? Side effects that you should report to your health care provider as soon aspossible: allergic reactions (skin rash, itching or hives; swelling of the face, lips, or tongue) diarrhea edema (sudden weight gain; swelling of the ankles, feet, hands or other unusual swelling; trouble breathing) fast, irregular heartbeat heart attack (trouble breathing; pain or tightness in the chest, neck, back or arms; unusually weak or tired) infection (fever, chills, cough, sore throat, pain or trouble passing urine) kidney injury (trouble passing urine or change in the amount of urine) liver injury (dark yellow or brown urine; general ill feeling or flu-like symptoms; loss of appetite, right upper belly pain; unusually weak or tired, yellowing of the eyes or skin) low blood pressure (dizziness; feeling faint or lightheaded, falls; unusually weak or tired) low red blood cell counts (trouble breathing; feeling faint; lightheaded,  falls; unusually weak or tired) mouth sores redness, blistering, peeling, or loosening of the skin, including inside the mouth stomach pain unusual bruising or bleeding wheezing (trouble breathing with loud or whistling sounds) vomiting Side effects that usually do not require medical attention (report to yourhealth care provider if they continue or are bothersome): headache joint pain muscle cramps, pain nausea This list may not describe all possible side effects. Call your doctor for medical advice about side effects. You may report side effects to FDA at1-800-FDA-1088. Where should I keep my medication? This medicine is given  in a hospital or clinic. It will not be stored at home. NOTE: This sheet is a summary. It may not cover all possible information. If you have questions about this medicine, talk to your doctor, pharmacist, orhealth care provider.  2022 Elsevier/Gold Standard (2020-05-07 15:47:26)

## 2020-12-31 NOTE — Progress Notes (Signed)
The following biosimilar Ziextenzo (pegfilgrastim-bmez) has been selected for use in this patient per insurance.  Henreitta Leber. PharmD 12/31/20  @ 1245

## 2020-12-31 NOTE — Progress Notes (Signed)
[facility] OFFICE PROGRESS NOTE   Diagnosis: Non-Hodgkin's lymphoma, history of rectal cancer  INTERVAL HISTORY:   Cody Hurley returns for follow-up.  He completed cycle 1 CVP/Rituxan 12/10/2020.  He denies nausea/vomiting.  No mouth sores.  No diarrhea.  He has pain at the "tailbone".  He denies signs of allergic reaction following cycle 1.  His wife reports his overall condition has improved.  He has more energy and is eating better.  Objective:  Vital signs in last 24 hours:  Blood pressure (!) 107/52, pulse 84, temperature 98.1 F (36.7 C), temperature source Tympanic, resp. rate 19, height 5' 7"  (1.702 m), weight 135 lb (61.2 kg), SpO2 98 %.    HEENT: No thrush or ulcers. Lymphatics: No palpable cervical, supraclavicular, axillary or inguinal lymph nodes. Resp: Lungs clear bilaterally. Cardio: Irregular, systolic murmur. GI: Abdomen soft and nontender.  No hepatosplenomegaly.  Left lower quadrant colostomy. Vascular: No leg edema. Skin: No rash. Right upper extremity PICC without erythema.  Lab Results:  Lab Results  Component Value Date   WBC 3.0 (L) 12/31/2020   HGB 11.9 (L) 12/31/2020   HCT 35.9 (L) 12/31/2020   MCV 103.5 (H) 12/31/2020   PLT 132 (L) 12/31/2020   NEUTROABS 1.8 12/31/2020    Imaging:  No results found.  Medications: I have reviewed the patient's current medications.  Assessment/Plan: Stage III rectal cancer,ypT4a,N2 status post a low anterior resection in September 2017 Presenting November 2016 with a mass at 10 cm from the anus, clinical stage II by EUS-T3N0 Neoadjuvant Xeloda and radiation beginning 06/01/2015, completed 20 of 28 planned fractions, discontinued secondary to toxicity Elevated pretreatment CEA CT chest 04/10/2019-no evidence of metastatic disease, changes of interstitial lung disease, changes suggestive of cirrhosis CT chest 11/22/2020-scattered dependent groundglass airspace opacity and irregular interstitial  opacity-nonspecific infectious or inflammatory, new bilateral adrenal nodules, extensive ill-defined soft tissue surrounding the right kidney suspicious for malignancy CT abdomen/pelvis 11/26/2020-bilateral adrenal masses, multinodular masslike soft tissue density throughout the right perinephric space and renal sinus, new left periaortic and retroperitoneal adenopathy, cirrhosis   Prostate cancer 1990 treated with a prostatectomy History of a TIA Carpal tunnel syndrome Palpable liver edge-ultrasound abdomen 04/10/2019-fatty infiltration of the liver, normal portal vein flow Left lung subsegmental pulmonary emboli 11/22/2020-apixaban Anorexia/weight loss COVID-19 infection June 2022 Admission 12/01/2020 with failure to thrive Elevated calcium-normal ionized calcium 12/03/2020 Retroperitoneal biopsy from 12/03/2020 consistent with large B-cell lymphoma CD20 positive, 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  Thrombocytopenia secondary to chemotherapy and lymphoma  Disposition: Cody Hurley has completed 1 cycle of CVP/Rituxan.  He is clinically better and the LDH has improved.  Dr. Benay Hurley recommends proceeding with cycle 2 CVP/Rituxan today as scheduled with white cell growth factor support 01/01/2021.  Consider adding Adriamycin with cycle 3.  Mr. and Cody Hurley agreed with this plan.  We reviewed the CBC from today.  Counts adequate to proceed with treatment.  White cell growth factor support as above.  They understand to contact the office of fever, chills, other signs of infection, bleeding.  Plan CBC with differential in 10 days.  He will get PICC line care at [facility].  He will return for lab, follow-up, cycle 3 chemotherapy in 3 weeks.  We are available to see him sooner if needed.  Patient seen with Dr.  Benay Hurley.  Cody Hurley ANP/GNP-BC   12/31/2020  9:47 AM  This was a shared visit with Cody Hurley.  Cody Hurley was interviewed and examined.  He completed 1 cycle of CVP/rituximab.  He has experienced marked clinical improvement.  The LDH is lower.  He will complete cycle 2 CVP/rituximab today.  We will consider adding doxorubicin with the next cycle of chemotherapy.  He developed neutropenia following cycle 1.  G-CSF will be added with cycle 2.  I was present for greater than 50% of today's visit.  I performed medical decision making.  [name], MD

## 2020-12-31 NOTE — Patient Instructions (Addendum)
PICC Home Care Guide A peripherally inserted central catheter (PICC) is a form of IV access that allows medicines and IV fluids to be quickly distributed throughout the body. The PICC is a long, thin, flexible tube (catheter) that is inserted into a vein in the upper arm. The catheter ends in a large vein in the chest (superior vena cava, or SVC). After the PICC is inserted, a chest X-ray may be done to make surethat it is in the correct place. A PICC may be placed for different reasons, such as: To give medicines and liquid nutrition. To give IV fluids and blood products. If there is trouble placing a peripheral intravenous (PIV) catheter. If taken care of properly, a PICC can remain in place for several months. Having a PICC can also allow a person to go home from the hospital sooner. Medicine and PICC care can be managed at home by a family member, caregiver, orhome health care team. What are the risks? Generally, having a PICC is safe. However, problems may occur, including: A blood clot (thrombus) forming in or at the tip of the PICC. A blood clot forming in a vein (deep vein thrombosis) or traveling to the lung (pulmonary embolism). Inflammation of the vein (phlebitis) in which the PICC is placed. Infection. Central line associated blood stream infection (CLABSI) is a serious infection that often requires hospitalization. PICC movement (malposition). The PICC tip may move from its original position due to excessive physical activity, forceful coughing, sneezing, or vomiting. A break or cut in the PICC. It is important not to use scissors near the PICC. Nerve or tendon irritation or injury during PICC insertion. How to take care of your PICC Preventing problems You and any caregivers should wash your hands often with soap. Wash hands: Before touching the PICC line or the infusion device. Before changing a bandage (dressing). Flush the PICC as told by your health care provider. Let your  health care provider know right away if the PICC is hard to flush or does not flush. Do not use force to flush the PICC. Do not use a syringe that is less than 10 mL to flush the PICC. Avoid blood pressure checks on the arm in which the PICC is placed. Never pull or tug on the PICC. Do not take the PICC out yourself. Only a trained clinical professional should remove the PICC. Use clean and sterile supplies only. Keep the supplies in a dry place. Do not reuse needles, syringes, or any other supplies. Doing that can lead to infection. Keep pets and children away from your PICC line. Check the PICC insertion site every day for signs of infection. Check for: Leakage. Redness, swelling, or pain. Fluid or blood. Warmth. Pus or a bad smell. PICC dressing care Keep your PICC bandage (dressing) clean and dry to prevent infection. Do not take baths, swim, or use a hot tub until your health care provider approves. Ask your health care provider if you can take showers. You may only be allowed to take sponge baths for bathing. When you are allowed to shower: Ask your health care provider to teach you how to wrap the PICC line. Cover the PICC line with clear plastic wrap and tape to keep it dry while showering. Follow instructions from your health care provider about how to take care of your insertion site and dressing. Make sure you: Wash your hands with soap and water before you change your bandage (dressing). If soap and water are not available,  use hand sanitizer. Change your dressing as told by your health care provider. Leave stitches (sutures), skin glue, or adhesive strips in place. These skin closures may need to stay in place for 2 weeks or longer. If adhesive strip edges start to loosen and curl up, you may trim the loose edges. Do not remove adhesive strips completely unless your health care provider tells you to do that. Change your PICC dressing if it becomes loose or wet. General  instructions  Carry your PICC identification card or wear a medical alert bracelet at all times. Keep the tube clamped at all times, unless it is being used. Carry a smooth-edge clamp with you at all times to place on the tube if it breaks. Do not use scissors or sharp objects near the tube. You may bend your arm and move it freely. If your PICC is near or at the bend of your elbow, avoid activity with repeated motion at the elbow. Avoid lifting heavy objects as told by your health care provider. Keep all follow-up visits as told by your health care provider. This is important.  Disposal of supplies Throw away any syringes in a disposal container that is meant for sharp items (sharps container). You can buy a sharps container from a pharmacy, or you can make one by using an empty hard plastic bottle with a cover. Place any used dressings or infusion bags into a plastic bag. Throw that bag in the trash. Contact a health care provider if: You have pain in your arm, ear, face, or teeth. You have a fever or chills. You have redness, swelling, or pain around the insertion site. You have fluid or blood coming from the insertion site. Your insertion site feels warm to the touch. You have pus or a bad smell coming from the insertion site. Your skin feels hard and raised around the insertion site. Get help right away if: Your PICC is accidentally pulled all the way out. If this happens, cover the insertion site with a bandage or gauze dressing. Do not throw the PICC away. Your health care provider will need to check it. Your PICC was tugged or pulled and has partially come out. Do not  push the PICC back in. You cannot flush the PICC, it is hard to flush, or the PICC leaks around the insertion site when it is flushed. You hear a "flushing" sound when the PICC is flushed. You feel your heart racing or skipping beats. There is a hole or tear in the PICC. You have swelling in the arm in which the  PICC was inserted. You have a red streak going up your arm from where the PICC was inserted. Summary A peripherally inserted central catheter (PICC) is a long, thin, flexible tube (catheter) that is inserted into a vein in the upper arm. The PICC is inserted using a sterile technique by a specially trained nurse or physician. Only a trained clinical professional should remove it. Keep your PICC identification card with you at all times. Avoid blood pressure checks on the arm in which the PICC is placed. If cared for properly, a PICC can remain in place for several months. Having a PICC can also allow a person to go home from the hospital sooner. This information is not intended to replace advice given to you by your health care provider. Make sure you discuss any questions you have with your healthcare provider. Document Revised: 09/25/2019 Document Reviewed: 09/25/2019 Elsevier Patient Education  2022 [employer].

## 2021-01-01 ENCOUNTER — Encounter (HOSPITAL_COMMUNITY): Payer: Self-pay

## 2021-01-01 ENCOUNTER — Inpatient Hospital Stay (HOSPITAL_COMMUNITY): Payer: Medicare Other | Attending: Hematology

## 2021-01-01 VITALS — BP 111/64 | HR 107 | Temp 98.3°F | Resp 19

## 2021-01-01 DIAGNOSIS — Z5189 Encounter for other specified aftercare: Secondary | ICD-10-CM | POA: Insufficient documentation

## 2021-01-01 DIAGNOSIS — Z85048 Personal history of other malignant neoplasm of rectum, rectosigmoid junction, and anus: Secondary | ICD-10-CM | POA: Insufficient documentation

## 2021-01-01 DIAGNOSIS — Z923 Personal history of irradiation: Secondary | ICD-10-CM | POA: Insufficient documentation

## 2021-01-01 DIAGNOSIS — Z5112 Encounter for antineoplastic immunotherapy: Secondary | ICD-10-CM | POA: Diagnosis present

## 2021-01-01 DIAGNOSIS — E876 Hypokalemia: Secondary | ICD-10-CM | POA: Diagnosis not present

## 2021-01-01 DIAGNOSIS — Z9079 Acquired absence of other genital organ(s): Secondary | ICD-10-CM | POA: Insufficient documentation

## 2021-01-01 DIAGNOSIS — Z87891 Personal history of nicotine dependence: Secondary | ICD-10-CM | POA: Diagnosis not present

## 2021-01-01 DIAGNOSIS — Z8546 Personal history of malignant neoplasm of prostate: Secondary | ICD-10-CM | POA: Diagnosis not present

## 2021-01-01 DIAGNOSIS — Z7901 Long term (current) use of anticoagulants: Secondary | ICD-10-CM | POA: Insufficient documentation

## 2021-01-01 DIAGNOSIS — R634 Abnormal weight loss: Secondary | ICD-10-CM | POA: Diagnosis not present

## 2021-01-01 DIAGNOSIS — Z8616 Personal history of COVID-19: Secondary | ICD-10-CM | POA: Diagnosis not present

## 2021-01-01 DIAGNOSIS — I482 Chronic atrial fibrillation, unspecified: Secondary | ICD-10-CM | POA: Diagnosis not present

## 2021-01-01 DIAGNOSIS — C8513 Unspecified B-cell lymphoma, intra-abdominal lymph nodes: Secondary | ICD-10-CM | POA: Insufficient documentation

## 2021-01-01 MED ORDER — PEGFILGRASTIM INJECTION 6 MG/0.6ML ~~LOC~~
6.0000 mg | PREFILLED_SYRINGE | Freq: Once | SUBCUTANEOUS | Status: AC
  Administered 2021-01-01: 6 mg via SUBCUTANEOUS
  Filled 2021-01-01: qty 0.6

## 2021-01-01 MED ORDER — PEGFILGRASTIM-BMEZ 6 MG/0.6ML ~~LOC~~ SOSY: 6.0000 mg | PREFILLED_SYRINGE | Freq: Once | SUBCUTANEOUS | Status: DC

## 2021-01-01 NOTE — Progress Notes (Signed)
Patient tolerated injection with no complaints voiced.  Site clean and dry with no bruising or swelling noted at site.  See MAR for details.  Band aid applied.  Patient stable during and after injection.  Vss with discharge and left in satisfactory condition with no s/s of distress noted.  

## 2021-01-01 NOTE — Patient Instructions (Signed)
[facility]  Discharge Instructions: Thank you for choosing [facility] to provide your oncology and hematology care.  If you have a lab appointment with the [facility], please come in thru the Main Entrance and check in at the main information desk.  Wear comfortable clothing and clothing appropriate for easy access to any Portacath or PICC line.   We strive to give you quality time with your provider. You may need to reschedule your appointment if you arrive late (15 or more minutes).  Arriving late affects you and other patients whose appointments are after yours.  Also, if you miss three or more appointments without notifying the office, you may be dismissed from the clinic at the provider's discretion.      For prescription refill requests, have your pharmacy contact our office and allow 72 hours for refills to be completed.    Today you received the following chemotherapy and/or immunotherapy agents neulasta       To help prevent nausea and vomiting after your treatment, we encourage you to take your nausea medication as directed.  BELOW ARE SYMPTOMS THAT SHOULD BE REPORTED IMMEDIATELY: *FEVER GREATER THAN 100.4 F (38 C) OR HIGHER *CHILLS OR SWEATING *NAUSEA AND VOMITING THAT IS NOT CONTROLLED WITH YOUR NAUSEA MEDICATION *UNUSUAL SHORTNESS OF BREATH *UNUSUAL BRUISING OR BLEEDING *URINARY PROBLEMS (pain or burning when urinating, or frequent urination) *BOWEL PROBLEMS (unusual diarrhea, constipation, pain near the anus) TENDERNESS IN MOUTH AND THROAT WITH OR WITHOUT PRESENCE OF ULCERS (sore throat, sores in mouth, or a toothache) UNUSUAL RASH, SWELLING OR PAIN  UNUSUAL VAGINAL DISCHARGE OR ITCHING   Items with * indicate a potential emergency and should be followed up as soon as possible or go to the Emergency Department if any problems should occur.  Please show the CHEMOTHERAPY ALERT CARD or IMMUNOTHERAPY ALERT CARD at check-in to the Emergency  Department and triage nurse.  Should you have questions after your visit or need to cancel or reschedule your appointment, please contact [facility] [address] [phone]  and follow the prompts.  Office hours are 8:00 a.m. to 4:30 p.m. Monday - Friday. Please note that voicemails left after 4:00 p.m. may not be returned until the following business day.  We are closed weekends and major holidays. You have access to a nurse at all times for urgent questions. Please call the main number to the clinic [phone] and follow the prompts.  For any non-urgent questions, you may also contact your provider using MyChart. We now offer e-Visits for anyone 39 and older to request care online for non-urgent symptoms. For details visit [email or web address].   Also download the MyChart app! Go to the app store, search "MyChart", open the app, select , and log in with your MyChart username and password.  Due to Covid, a mask is required upon entering the hospital/clinic. If you do not have a mask, one will be given to you upon arrival. For doctor visits, patients may have 1 support person aged 33 or older with them. For treatment visits, patients cannot have anyone with them due to current Covid guidelines and our immunocompromised population.

## 2021-01-04 ENCOUNTER — Other Ambulatory Visit: Payer: Self-pay

## 2021-01-04 MED ORDER — APIXABAN 2.5 MG PO TABS: 2.5000 mg | ORAL_TABLET | Freq: Two times a day (BID) | ORAL | 0 refills | Status: DC

## 2021-01-08 ENCOUNTER — Other Ambulatory Visit: Payer: Self-pay | Admitting: Nurse Practitioner

## 2021-01-08 ENCOUNTER — Other Ambulatory Visit: Payer: Self-pay

## 2021-01-08 ENCOUNTER — Encounter (HOSPITAL_COMMUNITY): Payer: Self-pay

## 2021-01-08 ENCOUNTER — Inpatient Hospital Stay (HOSPITAL_COMMUNITY): Payer: Medicare Other

## 2021-01-08 DIAGNOSIS — Z5112 Encounter for antineoplastic immunotherapy: Secondary | ICD-10-CM | POA: Diagnosis not present

## 2021-01-08 DIAGNOSIS — C8593 Non-Hodgkin lymphoma, unspecified, intra-abdominal lymph nodes: Secondary | ICD-10-CM

## 2021-01-08 LAB — LACTATE DEHYDROGENASE: LDH: 269 U/L — ABNORMAL HIGH (ref 98–192)

## 2021-01-08 LAB — CBC WITH DIFFERENTIAL/PLATELET
Band Neutrophils: 10 %
Basophils Absolute: 0 10*3/uL (ref 0.0–0.1)
Basophils Relative: 0 %
Eosinophils Absolute: 0 10*3/uL (ref 0.0–0.5)
Eosinophils Relative: 0 %
HCT: 34.1 % — ABNORMAL LOW (ref 39.0–52.0)
Hemoglobin: 11.4 g/dL — ABNORMAL LOW (ref 13.0–17.0)
Lymphocytes Relative: 7 %
Lymphs Abs: 0.9 10*3/uL (ref 0.7–4.0)
MCH: 35 pg — ABNORMAL HIGH (ref 26.0–34.0)
MCHC: 33.4 g/dL (ref 30.0–36.0)
MCV: 104.6 fL — ABNORMAL HIGH (ref 80.0–100.0)
Monocytes Absolute: 2.3 10*3/uL — ABNORMAL HIGH (ref 0.1–1.0)
Monocytes Relative: 17 %
Myelocytes: 4 %
Neutro Abs: 9.6 10*3/uL — ABNORMAL HIGH (ref 1.7–7.7)
Neutrophils Relative %: 62 %
Platelets: 102 10*3/uL — ABNORMAL LOW (ref 150–400)
RBC: 3.26 MIL/uL — ABNORMAL LOW (ref 4.22–5.81)
RDW: 15.8 % — ABNORMAL HIGH (ref 11.5–15.5)
WBC: 13.4 10*3/uL — ABNORMAL HIGH (ref 4.0–10.5)
nRBC: 0.4 % — ABNORMAL HIGH (ref 0.0–0.2)

## 2021-01-08 LAB — COMPREHENSIVE METABOLIC PANEL
ALT: 36 U/L (ref 0–44)
AST: 50 U/L — ABNORMAL HIGH (ref 15–41)
Albumin: 3 g/dL — ABNORMAL LOW (ref 3.5–5.0)
Alkaline Phosphatase: 164 U/L — ABNORMAL HIGH (ref 38–126)
Anion gap: 6 (ref 5–15)
BUN: 11 mg/dL (ref 8–23)
CO2: 29 mmol/L (ref 22–32)
Calcium: 9 mg/dL (ref 8.9–10.3)
Chloride: 102 mmol/L (ref 98–111)
Creatinine, Ser: 0.53 mg/dL — ABNORMAL LOW (ref 0.61–1.24)
GFR, Estimated: >60 mL/min (ref >60)
Glucose, Bld: 104 mg/dL — ABNORMAL HIGH (ref 70–99)
Potassium: 2.7 mmol/L — CL (ref 3.5–5.1)
Sodium: 137 mmol/L (ref 135–145)
Total Bilirubin: 1 mg/dL (ref 0.3–1.2)
Total Protein: 5.9 g/dL — ABNORMAL LOW (ref 6.5–8.1)

## 2021-01-08 MED ORDER — SODIUM CHLORIDE 0.9% FLUSH
10.0000 mL | Freq: Once | INTRAVENOUS | Status: AC
  Administered 2021-01-08: 10 mL via INTRAVENOUS

## 2021-01-08 MED ORDER — HEPARIN SOD (PORK) LOCK FLUSH 100 UNIT/ML IV SOLN
250.0000 [IU] | Freq: Once | INTRAVENOUS | Status: AC
  Administered 2021-01-08: 250 [IU] via INTRAVENOUS

## 2021-01-08 MED ORDER — POTASSIUM CHLORIDE CRYS ER 20 MEQ PO TBCR: EXTENDED_RELEASE_TABLET | ORAL | 0 refills | Status: DC

## 2021-01-08 NOTE — Progress Notes (Signed)
Today patient arrived for lab draw and PICC line dressing change. Labs drawn. Lumen 1 flushed with 10 mL, good blood return noted and locked with 250 units of heparin. Lumen 2 flushed with 44m, good blood return noted and locked with 250 units of heparin. 2 RNs completed dressing change with no complaints voiced from patient. No swelling or bruising at site. Patient discharged in satisfactory condition, via wheelchair, VVS stable with no signs or symptoms of distressed noted.

## 2021-01-08 NOTE — Patient Instructions (Signed)
[facility]  Discharge Instructions: Thank you for choosing [facility] to provide your oncology and hematology care.  If you have a lab appointment with the [facility], please come in thru the Main Entrance and check in at the main information desk.  Wear comfortable clothing and clothing appropriate for easy access to any Portacath or PICC line.   We strive to give you quality time with your provider. You may need to reschedule your appointment if you arrive late (15 or more minutes).  Arriving late affects you and other patients whose appointments are after yours.  Also, if you miss three or more appointments without notifying the office, you may be dismissed from the clinic at the provider's discretion.      For prescription refill requests, have your pharmacy contact our office and allow 72 hours for refills to be completed.    Today your PICC line wash flushed and dressing was changed. Return as scheduled.   To help prevent nausea and vomiting after your treatment, we encourage you to take your nausea medication as directed.  BELOW ARE SYMPTOMS THAT SHOULD BE REPORTED IMMEDIATELY: *FEVER GREATER THAN 100.4 F (38 C) OR HIGHER *CHILLS OR SWEATING *NAUSEA AND VOMITING THAT IS NOT CONTROLLED WITH YOUR NAUSEA MEDICATION *UNUSUAL SHORTNESS OF BREATH *UNUSUAL BRUISING OR BLEEDING *URINARY PROBLEMS (pain or burning when urinating, or frequent urination) *BOWEL PROBLEMS (unusual diarrhea, constipation, pain near the anus) TENDERNESS IN MOUTH AND THROAT WITH OR WITHOUT PRESENCE OF ULCERS (sore throat, sores in mouth, or a toothache) UNUSUAL RASH, SWELLING OR PAIN  UNUSUAL VAGINAL DISCHARGE OR ITCHING   Items with * indicate a potential emergency and should be followed up as soon as possible or go to the Emergency Department if any problems should occur.  Please show the CHEMOTHERAPY ALERT CARD or IMMUNOTHERAPY ALERT CARD at check-in to the Emergency Department  and triage nurse.  Should you have questions after your visit or need to cancel or reschedule your appointment, please contact [facility] [phone]  and follow the prompts.  Office hours are 8:00 a.m. to 4:30 p.m. Monday - Friday. Please note that voicemails left after 4:00 p.m. may not be returned until the following business day.  We are closed weekends and major holidays. You have access to a nurse at all times for urgent questions. Please call the main number to the clinic [phone] and follow the prompts.  For any non-urgent questions, you may also contact your provider using MyChart. We now offer e-Visits for anyone 85 and older to request care online for non-urgent symptoms. For details visit [email or web address].   Also download the MyChart app! Go to the app store, search "MyChart", open the app, select [facility], and log in with your MyChart username and password.  Due to Covid, a mask is required upon entering the hospital/clinic. If you do not have a mask, one will be given to you upon arrival. For doctor visits, patients may have 1 support person aged 60 or older with them. For treatment visits, patients cannot have anyone with them due to current Covid guidelines and our immunocompromised population.

## 2021-01-08 NOTE — Progress Notes (Signed)
CRITICAL VALUE STICKER  CRITICAL VALUE:  K+ 2.7  RECEIVER (on-site recipient of call):  Lora Havens, [place] NOTIFIED: 01/08/2021 at 62  MD NOTIFIED:  L. Marcello Moores, NP ([facility])  RESPONSE:  Rx oral potassium sent to pt's pharmacy by NP.  [facility] staff will reach out to pt to notify of low potassium.  Pt will return to our clinic next week for new patient appointment and repeat labs.

## 2021-01-13 NOTE — Progress Notes (Signed)
[facility] [address] [place] [address]   CLINIC:  Medical Oncology/Hematology  CONSULT NOTE  Patient Care Team: [name], MD as PCP - General (Internal Medicine)  CHIEF COMPLAINTS/PURPOSE OF CONSULTATION:  Evaluation of Non-Hodgkin's lymphoma and history of rectal cancer  HISTORY OF PRESENTING ILLNESS:  [name] 87 y.o. male is here because of Non-Hodgkin's lymphoma and history of rectal cancer, at the request of [facility].  Today he reports feeling well, and he is accompanied by his wife. He reports losing 30 lbs over the past 6 months. He has a Covid infection in June 2022, and experienced increased fatigue that is still present. His baseline weight in 160 lbs. His appetite is reduced, and he drinks 1 Boost/Ensure daily. He walks with the assistance of a walker, and he is currently in physical therapy to increase leg strength. He reports 1 fall after being discharged from [facility] on 12/01/20 following his first chemotherapy treatment. He denies nausea and vomiting.  He has chronic numbness in his hands associated with carpal tunnel syndrome. He also reports a chronic Afib for which he is taking Eliquis. He reports normal BM.   MEDICAL HISTORY:  Past Medical History:  Diagnosis Date   Colorectal cancer ([facility]) 2016   Prostate cancer ([place])     SURGICAL HISTORY: Past Surgical History:  Procedure Laterality Date   COLOSTOMY     MELANOMA EXCISION     ear   PROSTATE SURGERY  1991    SOCIAL HISTORY: Social History   Socioeconomic History   Marital status: Married    Spouse name: Not on file   Number of children: Not on file   Years of education: Not on file   Highest education level: Not on file  Occupational History   Not on file  Tobacco Use   Smoking status: Former    Packs/day: 0.50    Types: Cigarettes    Quit date: 48    Years since quitting: 74.6   Smokeless tobacco: Never  Vaping Use   Vaping Use: Never used   Substance and Sexual Activity   Alcohol use: Never   Drug use: Never   Sexual activity: Not on file  Other Topics Concern   Not on file  Social History Narrative   Not on file   Social Determinants of Health   Financial Resource Strain: Low Risk    Difficulty of Paying Living Expenses: Not very hard  Food Insecurity: No Food Insecurity   Worried About Charity fundraiser in the Last Year: Never true   [place] in the Last Year: Never true  Transportation Needs: No Transportation Needs   Lack of Transportation (Medical): No   Lack of Transportation (Non-Medical): No  Physical Activity: Inactive   Days of Exercise per Week: 0 days   Minutes of Exercise per Session: 0 min  Stress: No Stress Concern Present   Feeling of Stress : Not at all  Social Connections: Moderately Integrated   Frequency of Communication with Friends and Family: Three times a week   Frequency of Social Gatherings with Friends and Family: Three times a week   Attends Religious Services: 1 to 4 times per year   Active Member of Clubs or Organizations: No   Attends Archivist Meetings: Never   Marital Status: Married  Human resources officer Violence: Not At Risk   Fear of Current or Ex-Partner: No   Emotionally Abused: No  Physically Abused: No   Sexually Abused: No    FAMILY HISTORY: No family history on file.  ALLERGIES:  is allergic to penicillin g.  MEDICATIONS:  Current Outpatient Medications  Medication Sig Dispense Refill   acetaminophen (TYLENOL) 500 MG tablet Take 500-1,000 mg by mouth every 6 (six) hours as needed for mild pain (or headaches).     apixaban (ELIQUIS) 2.5 MG TABS tablet Take 1 tablet (2.5 mg total) by mouth 2 (two) times daily. 60 tablet 0   APIXABAN (ELIQUIS) VTE STARTER PACK (10MG AND 5MG) Take as directed on package: start with two-38m tablets twice daily for 7 days. On day 8, switch to one-544mtablet twice daily. 1 each 0   Cholecalciferol (VITAMIN D-3) 25  MCG (1000 UT) CAPS Take 2,000 Units by mouth daily with breakfast.     feeding supplement (ENSURE ENLIVE / ENSURE PLUS) LIQD Take 237 mLs by mouth 2 (two) times daily between meals. 237 mL 12   heparin lock flush 100 UNIT/ML SOLN injection 2.5 mLs (250 Units total) by Intracatheter route 2 (two) times a week. Inject 2.5 ml to each lumen of PICC line twice weekly after NS flush 120 mL 2   levofloxacin (LEVAQUIN) 500 MG tablet Take 1 tablet (500 mg total) by mouth daily. 7 tablet 0   Multiple Vitamin (MULTIVITAMIN) tablet Take 1 tablet by mouth daily.     oxybutynin (DITROPAN) 5 MG tablet Take 5 mg by mouth daily.     potassium chloride SA (KLOR-CON) 20 MEQ tablet Take 2 tabs daily for 3 days, then 1 tab daily 30 tablet 0   sodium chloride flush 0.9 % SOLN injection 10 mLs by Intracatheter route 2 (two) times a week. Inject into both PICC line ports twice weekly 480 mL 2   vitamin B-12 (CYANOCOBALAMIN) 100 MCG tablet Take 100 mcg by mouth daily.     No current facility-administered medications for this visit.    REVIEW OF SYSTEMS:   Review of Systems  Constitutional:  Positive for appetite change (50%), fatigue (75%) and unexpected weight change (- 30 lbs).  HENT:   Positive for trouble swallowing.   Musculoskeletal:  Positive for arthralgias (tail bone 6/10).  Neurological:  Positive for numbness (tingling in fingers).  All other systems reviewed and are negative.   PHYSICAL EXAMINATION: ECOG PERFORMANCE STATUS: 2 - Symptomatic, <50% confined to bed  Vitals:   01/14/21 1450  BP: (!) 121/53  Pulse: 76  Resp: 16  Temp: 97.9 F (36.6 C)  SpO2: 98%   Filed Weights   01/14/21 1450  Weight: 133 lb 11.2 oz (60.6 kg)   Physical Exam Vitals reviewed.  Constitutional:      Appearance: Normal appearance.     Comments: In wheelchair  HENT:     Mouth/Throat:     Comments: Thrush present Cardiovascular:     Rate and Rhythm: Normal rate. Rhythm irregular.     Pulses: Normal pulses.      Heart sounds: Normal heart sounds.  Pulmonary:     Effort: Pulmonary effort is normal.     Breath sounds: Normal breath sounds.  Musculoskeletal:     Right lower leg: No edema.     Left lower leg: No edema.  Lymphadenopathy:     Cervical: No cervical adenopathy.     Right cervical: No superficial cervical adenopathy.    Left cervical: No superficial cervical adenopathy.     Upper Body:     Right upper body: No axillary or  pectoral adenopathy.     Left upper body: No axillary or pectoral adenopathy.  Neurological:     General: No focal deficit present.     Mental Status: He is alert and oriented to person, place, and time.  Psychiatric:        Mood and Affect: Mood normal.        Behavior: Behavior normal.     LABORATORY DATA:  I have reviewed the data as listed CBC Latest Ref Rng & Units 01/08/2021 12/31/2020 12/24/2020  WBC 4.0 - 10.5 K/uL 13.4(H) 3.0(L) 1.5(L)  Hemoglobin 13.0 - 17.0 g/dL 11.4(L) 11.9(L) 11.6(L)  Hematocrit 39.0 - 52.0 % 34.1(L) 35.9(L) 35.5(L)  Platelets 150 - 400 K/uL 102(L) 132(L) 82(L)   CMP Latest Ref Rng & Units 01/08/2021 12/31/2020 12/15/2020  Glucose 70 - 99 mg/dL 104(H) 156(H) 143(H)  BUN 8 - 23 mg/dL _0 Creatinine 0.61 - 1.24 mg/dL 0.53(L) 0.42(L) <0.30(L)  Sodium 135 - 145 mmol/L 137 141 138  Potassium 3.5 - 5.1 mmol/L 2.7(LL) 3.3(L) 3.9  Chloride 98 - 111 mmol/L 102 107 100  CO2 22 - 32 mmol/L 29 28 34(H)  Calcium 8.9 - 10.3 mg/dL 9.0 8.9 9.0  Total Protein 6.5 - 8.1 g/dL 5.9(L) 6.0(L) 5.5(L)  Total Bilirubin 0.3 - 1.2 mg/dL 1.0 1.0 1.4(H)  Alkaline Phos 38 - 126 U/L 164(H) 115 69  AST 15 - 41 U/L 50(H) 44(H) 82(H)  ALT 0 - 44 U/L 36 35 56(H)    RADIOGRAPHIC STUDIES: I have personally reviewed the radiological images as listed and agreed with the findings in the report. No results found.  ASSESSMENT:  1.  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  Perirenal spaces  with periaortic/retroperitoneal adenopathy, nodular thickening of the adrenal glands.  Ascites and edematous changes in the mesentery with cirrhotic features, portal enteropathy. - Presentation with 30 pound weight loss in the last 6 months. 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  Ki-67 95%.  Positive CD20, BCL6, CD5 and Bcl-2. - FISH positive for BCL6 and MYC rearrangement. - Cycle 1 of R-CVP on 12/11/2020 - Cycle 2 of R-CVP on 12/31/2020  2.  Stage III (ypT4, N2) rectal cancer: - Presentation in November 2016 with mass at 10 cm from the anus, clinical stage II by EUS-T3N0 - Neoadjuvant Xeloda and radiation from 06/01/2015, completed 20/28 planned fractions, discontinued secondary to toxicity. - Low anterior resection in September 2017, ypT4a, N2  3.  Prostate cancer: - Treated with prostatectomy 1990.  4.  Social/family history: - He lives at home with his wife of 12 years.  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  No family history of malignancies.    PLAN:  1.  Double hit lymphoma: - I have reviewed her records from the time of diagnosis and hospitalizations. - He has tolerated chemotherapy reasonably well with no major GI toxicity or neuropathy. - He ambulates with the help of walker at home.  He is doing physical therapy twice weekly. - Overall he has been feeling reasonably well since the start of therapy according to the wife. - Overall prognosis is poor based on double hit lymphoma. - We will likely proceed with cycle 3 next week with consideration of adding Adriamycin at 50% dose. - I have reviewed his echocardiogram from July which showed normal ejection fraction. - We will plan to repeat imaging after cycle  3.  2.  Weight loss: - He lost about 30 pounds in the last 6 months. - His eating is limited with small portions 3 times daily.  He is drinking 1 protein shake daily. - Wife reports that until 2 months ago  he was doing household work and Engineer, maintenance. - We will seek dietary consultation and the recommendations.  3.  Hypokalemia: - Potassium was 2.7 on 01/08/2021 which was corrected. - He is taking potassium 20 mEq daily. - We have checked his potassium today which improved to 4.2.  4.  Atrial fibrillation: - He has chronic A. fib and is on Eliquis without any bleeding issues.   All questions were answered. The patient knows to call the clinic with any problems, questions or concerns.   Derek Jack, MD, 01/14/21 3:10 PM  [facility] [phone]   I, Thana Ates, am acting as a scribe for Dr. Derek Jack.  I, Derek Jack MD, have reviewed the above documentation for accuracy and completeness, and I agree with the above.

## 2021-01-14 ENCOUNTER — Inpatient Hospital Stay (HOSPITAL_BASED_OUTPATIENT_CLINIC_OR_DEPARTMENT_OTHER): Payer: Medicare Other | Admitting: Hematology

## 2021-01-14 ENCOUNTER — Other Ambulatory Visit (HOSPITAL_COMMUNITY): Payer: Self-pay

## 2021-01-14 ENCOUNTER — Encounter (HOSPITAL_COMMUNITY): Payer: Self-pay | Admitting: Hematology

## 2021-01-14 ENCOUNTER — Ambulatory Visit (HOSPITAL_COMMUNITY): Payer: Medicare Other | Admitting: Dietician

## 2021-01-14 ENCOUNTER — Inpatient Hospital Stay (HOSPITAL_COMMUNITY): Payer: Medicare Other

## 2021-01-14 ENCOUNTER — Other Ambulatory Visit: Payer: Self-pay

## 2021-01-14 VITALS — BP 121/53 | HR 76 | Temp 97.9°F | Resp 16 | Wt 133.7 lb

## 2021-01-14 DIAGNOSIS — E876 Hypokalemia: Secondary | ICD-10-CM | POA: Diagnosis not present

## 2021-01-14 DIAGNOSIS — I482 Chronic atrial fibrillation, unspecified: Secondary | ICD-10-CM

## 2021-01-14 DIAGNOSIS — C8590 Non-Hodgkin lymphoma, unspecified, unspecified site: Secondary | ICD-10-CM

## 2021-01-14 DIAGNOSIS — C8593 Non-Hodgkin lymphoma, unspecified, intra-abdominal lymph nodes: Secondary | ICD-10-CM

## 2021-01-14 DIAGNOSIS — Z85048 Personal history of other malignant neoplasm of rectum, rectosigmoid junction, and anus: Secondary | ICD-10-CM

## 2021-01-14 DIAGNOSIS — Z87891 Personal history of nicotine dependence: Secondary | ICD-10-CM

## 2021-01-14 DIAGNOSIS — Z5112 Encounter for antineoplastic immunotherapy: Secondary | ICD-10-CM | POA: Diagnosis not present

## 2021-01-14 DIAGNOSIS — R634 Abnormal weight loss: Secondary | ICD-10-CM | POA: Diagnosis not present

## 2021-01-14 DIAGNOSIS — Z7901 Long term (current) use of anticoagulants: Secondary | ICD-10-CM

## 2021-01-14 LAB — BASIC METABOLIC PANEL
Anion gap: 5 (ref 5–15)
BUN: 12 mg/dL (ref 8–23)
CO2: 26 mmol/L (ref 22–32)
Calcium: 9.4 mg/dL (ref 8.9–10.3)
Chloride: 106 mmol/L (ref 98–111)
Creatinine, Ser: 0.53 mg/dL — ABNORMAL LOW (ref 0.61–1.24)
GFR, Estimated: >60 mL/min (ref >60)
Glucose, Bld: 135 mg/dL — ABNORMAL HIGH (ref 70–99)
Potassium: 4.2 mmol/L (ref 3.5–5.1)
Sodium: 137 mmol/L (ref 135–145)

## 2021-01-14 MED ORDER — SODIUM CHLORIDE 0.9% FLUSH
10.0000 mL | Freq: Once | INTRAVENOUS | Status: AC
  Administered 2021-01-14: 10 mL via INTRAVENOUS

## 2021-01-14 MED ORDER — HEPARIN SOD (PORK) LOCK FLUSH 100 UNIT/ML IV SOLN
500.0000 [IU] | Freq: Once | INTRAVENOUS | Status: AC
  Administered 2021-01-14: 500 [IU] via INTRAVENOUS

## 2021-01-14 MED ORDER — NYSTATIN 100000 UNIT/ML MT SUSP: 5.0000 mL | Freq: Four times a day (QID) | OROMUCOSAL | 0 refills | Status: AC

## 2021-01-14 MED ORDER — MEGESTROL ACETATE 400 MG/10ML PO SUSP: 400.0000 mg | Freq: Two times a day (BID) | ORAL | 0 refills | Status: DC

## 2021-01-14 NOTE — Progress Notes (Unsigned)
Cody Hurley presented for PICC line flush. PICC line located right arm Good blood return present. PICC line flushed with 22m NS and 300U/313mHeparin in each lumen.  Procedure without incident.  Vitals stable and discharged home from clinic via wheelchair. Follow up as scheduled.

## 2021-01-14 NOTE — Patient Instructions (Addendum)
[facility] at [facility] Discharge Instructions  You were seen and examined today by Dr. Delton Coombes. Dr. Delton Coombes is a medical oncologist, meaning he specializes in the management of cancer diagnoses with medications. Dr. Delton Coombes discussed your past medical history, family history of cancer and you most recent cancer history, including treatment.  Dr. Delton Coombes has recommended proceeding with treatment in one week. Dr. Delton Coombes is recommending adding Adriamycin at your next treatment, that is an additional chemotherapy medication. Dr. Delton Coombes will also recheck potassium today. If there is any need for additional changes to your potassium pills, someone from the clinic will call you.   Thank you for choosing [facility] at [facility] to provide your oncology and hematology care.  To afford each patient quality time with our provider, please arrive at least 15 minutes before your scheduled appointment time.   If you have a lab appointment with the [facility] please come in thru the Main Entrance and check in at the main information desk.  You need to re-schedule your appointment should you arrive 10 or more minutes late.  We strive to give you quality time with our providers, and arriving late affects you and other patients whose appointments are after yours.  Also, if you no show three or more times for appointments you may be dismissed from the clinic at the providers discretion.     Again, thank you for choosing [facility].  Our hope is that these requests will decrease the amount of time that you wait before being seen by our physicians.       _____________________________________________________________  Should you have questions after your visit to [facility], please contact our office at [phone] and follow the prompts.  Our office hours are 8:00 a.m. and 4:30 p.m. Monday - Friday.  Please note that  voicemails left after 4:00 p.m. may not be returned until the following business day.  We are closed weekends and major holidays.  You do have access to a nurse 24-7, just call the main number to the clinic [phone] and do not press any options, hold on the line and a nurse will answer the phone.    For prescription refill requests, have your pharmacy contact our office and allow 72 hours.    Due to Covid, you will need to wear a mask upon entering the hospital. If you do not have a mask, a mask will be given to you at the Main Entrance upon arrival. For doctor visits, patients may have 1 support person age 23 or older with them. For treatment visits, patients can not have anyone with them due to social distancing guidelines and our immunocompromised population.

## 2021-01-14 NOTE — Progress Notes (Signed)
Nutrition Assessment   Reason for Assessment: Provider request   ASSESSMENT: 87 year old male with B cell lymphoma. He is receiving CVP/rituximab  Past medical history significant for rectal cancer s/p LAR (01/2016), prostate cancer s/p prostatectomy, pulmonary embolism, pressure injury of skin, Covid infection (10/2020)  Met with patient and wife in clinic. Patient is hard of hearing, wife provides majority of history. Patient is not a "big eater" at baseline, usually eats breakfast and dinner meals. Wife reports appetite started to decline in March while down in [place] which worsened after having Covid in June. Patient denied taste and smell changes with infection. Patient eats a small breakfast (cereal with whole milk, cinnamon toast, sometimes oatmeal and half cup of coffee with cream) He likes roast beef and oyster stew for dinner. Wife reports he has been drinking 1 Equate High Protein, patient does not want to drink more. He occasionally snacks on nabs and likes ice cream.   Nutrition Focused Physical Exam: unable to perform at this time   Medications: Eliquis, D3, Megace, MVI, Klor-con, B12   Labs: 8/12 - K 2.7, Glucose 104, LDH 269   Anthropometrics: Weight has decreased ~15 lbs (10%) from 148 lbs on 6/26. This is significant.  Height: 5'7" Weight: 133 lb 11.2 oz UBW: 172 lb (May 2021 per chart) BMI: 20.94   NUTRITION DIAGNOSIS: Unintentional weight loss related to cancer and associated treatments as evidenced by poor appetite and 10% (15 lb) weight loss in 6 weeks. This is significant for time frame.    MALNUTRITION DIAGNOSIS: Suspect moderate degree malnutrition given <75% energy intake >1 month per family report and significant weight loss. Unable to perform nutrition focused physical exam at this time to identify fat and muscle depletion   INTERVENTION:  Educated on eating smaller meals with snacks in between that are high in calories and protein, handout with ideas  provided Discussed strategies for poor appetite and ways to increase calories/protein making most of every bite, handout provided Educated on alternate nutrition supplements, recommended patient drink 2 Ensure Plus/equivalent daily (350 kcal, 16 grams of protein), Ensure coupons provided Patient will add bedtime snack for added calories and protein MD has ordered appetite stimulant Contact information provided   MONITORING, EVALUATION, GOAL: Patient will tolerate increased calories and protein to promote stable weight    Next Visit: Thursday September 15 in infusion

## 2021-01-15 ENCOUNTER — Other Ambulatory Visit (HOSPITAL_COMMUNITY): Payer: Medicare Other

## 2021-01-20 ENCOUNTER — Other Ambulatory Visit (HOSPITAL_COMMUNITY): Payer: Self-pay

## 2021-01-20 DIAGNOSIS — C8593 Non-Hodgkin lymphoma, unspecified, intra-abdominal lymph nodes: Secondary | ICD-10-CM

## 2021-01-20 NOTE — Progress Notes (Signed)
Cody Hurley, Cody Hurley   CLINIC:  Medical Oncology/Hematology  PCP:  [name], MD [address] [place] [address] [phone]   REASON FOR VISIT:  Follow-up for Non-Hodgkin's lymphoma and history of rectal cancer  PRIOR THERAPY: none  NGS Results: not done  CURRENT THERAPY: Eliquis  BRIEF ONCOLOGIC HISTORY:  Oncology History  Non-Hodgkin lymphoma ([facility])  12/09/2020 Initial Diagnosis   Non-Hodgkin lymphoma ([facility])   12/11/2020 -  Chemotherapy    Patient is on Treatment Plan: NON-HODGKINS LYMPHOMA CVP + RITUXIMAB Q21D         CANCER STAGING: Cancer Staging No matching staging information was found for the patient.  INTERVAL HISTORY:  Cody Hurley, a 87 y.o. male, returns for routine follow-up and consideration for next cycle of chemotherapy. Cody Hurley was last seen on 01/14/2021.  Due for cycle #4 of CVP+Rituximab today.   Overall, he tells me he has been feeling pretty well. He reports improved appetite and ankle swellings. He has gained 5 lbs sine 08/18. He walks with the assistance of a walker and continues physical therapy. He denies tingling/numbness, nausea, and vomiting.  Overall, he feels ready for next cycle of chemo today.   REVIEW OF SYSTEMS:  Review of Systems  Constitutional:  Positive for fatigue (50%). Negative for appetite change (80%) and unexpected weight change (+5 lbs).  Cardiovascular:  Negative for leg swelling (improved).  Gastrointestinal:  Negative for nausea and vomiting.  Musculoskeletal:  Positive for back pain (5/10).  Neurological:  Negative for numbness.  Psychiatric/Behavioral:  Positive for sleep disturbance.   All other systems reviewed and are negative.  PAST MEDICAL/SURGICAL HISTORY:  Past Medical History:  Diagnosis Date   Colorectal cancer ([place]) 2016   Prostate cancer [facility])    Past Surgical History:  Procedure Laterality Date   COLOSTOMY     MELANOMA EXCISION      ear   PROSTATE SURGERY  1991    SOCIAL HISTORY:  Social History   Socioeconomic History   Marital status: Married    Spouse name: Not on file   Number of children: Not on file   Years of education: Not on file   Highest education level: Not on file  Occupational History   Not on file  Tobacco Use   Smoking status: Former    Packs/day: 0.50    Types: Cigarettes    Quit date: 62    Years since quitting: 74.6   Smokeless tobacco: Never  Vaping Use   Vaping Use: Never used  Substance and Sexual Activity   Alcohol use: Never   Drug use: Never   Sexual activity: Not on file  Other Topics Concern   Not on file  Social History Narrative   Not on file   Social Determinants of Health   Financial Resource Strain: Low Risk    Difficulty of Paying Living Expenses: Not very hard  Food Insecurity: No Food Insecurity   Worried About Charity fundraiser in the Last Year: Never true   [place] in the Last Year: Never true  Transportation Needs: No Transportation Needs   Lack of Transportation (Medical): No   Lack of Transportation (Non-Medical): No  Physical Activity: Inactive   Days of Exercise per Week: 0 days   Minutes of Exercise per Session: 0 min  Stress: No Stress Concern Present   Feeling of Stress : Not at all  Social Connections: Moderately Integrated  Frequency of Communication with Friends and Family: Three times a week   Frequency of Social Gatherings with Friends and Family: Three times a week   Attends Religious Services: 1 to 4 times per year   Active Member of Clubs or Organizations: No   Attends Archivist Meetings: Never   Marital Status: Married  Human resources officer Violence: Not At Risk   Fear of Current or Ex-Partner: No   Emotionally Abused: No   Physically Abused: No   Sexually Abused: No    FAMILY HISTORY:  History reviewed. No pertinent family history.  CURRENT MEDICATIONS:  Current Outpatient Medications  Medication Sig  Dispense Refill   acetaminophen (TYLENOL) 500 MG tablet Take 500-1,000 mg by mouth every 6 (six) hours as needed for mild pain (or headaches).     apixaban (ELIQUIS) 2.5 MG TABS tablet Take 1 tablet (2.5 mg total) by mouth 2 (two) times daily. 60 tablet 0   APIXABAN (ELIQUIS) VTE STARTER PACK (10MG AND 5MG) Take as directed on package: start with two-85m tablets twice daily for 7 days. On day 8, switch to one-576mtablet twice daily. 1 each 0   Cholecalciferol (VITAMIN D-3) 25 MCG (1000 UT) CAPS Take 2,000 Units by mouth daily with breakfast.     feeding supplement (ENSURE ENLIVE / ENSURE PLUS) LIQD Take 237 mLs by mouth 2 (two) times daily between meals. 237 mL 12   heparin lock flush 100 UNIT/ML SOLN injection 2.5 mLs (250 Units total) by Intracatheter route 2 (two) times a week. Inject 2.5 ml to each lumen of PICC line twice weekly after NS flush 120 mL 2   levofloxacin (LEVAQUIN) 500 MG tablet Take 1 tablet (500 mg total) by mouth daily. 7 tablet 0   megestrol (MEGACE) 400 MG/10ML suspension Take 10 mLs (400 mg total) by mouth 2 (two) times daily. 240 mL 0   Multiple Vitamin (MULTIVITAMIN) tablet Take 1 tablet by mouth daily.     nystatin (MYCOSTATIN) 100000 UNIT/ML suspension Take 5 mLs (500,000 Units total) by mouth 4 (four) times daily. 60 mL 0   oxybutynin (DITROPAN) 5 MG tablet Take 5 mg by mouth daily.     potassium chloride SA (KLOR-CON) 20 MEQ tablet Take 2 tabs daily for 3 days, then 1 tab daily 30 tablet 0   sodium chloride flush 0.9 % SOLN injection 10 mLs by Intracatheter route 2 (two) times a week. Inject into both PICC line ports twice weekly 480 mL 2   vitamin B-12 (CYANOCOBALAMIN) 100 MCG tablet Take 100 mcg by mouth daily.     No current facility-administered medications for this visit.    ALLERGIES:  Allergies  Allergen Reactions   Penicillin G Rash    PHYSICAL EXAM:  Performance status (ECOG): 2 - Symptomatic, <50% confined to bed  Vitals:   01/21/21 0802  BP: (!)  108/49  Pulse: 88  Resp: 18  Temp: 98.4 F (36.9 C)  SpO2: 98%   Wt Readings from Last 3 Encounters:  01/21/21 138 lb 3.7 oz (62.7 kg)  01/14/21 133 lb 11.2 oz (60.6 kg)  12/31/20 135 lb (61.2 kg)   Physical Exam Vitals reviewed.  Constitutional:      Appearance: Normal appearance.  HENT:     Mouth/Throat:     Tongue: No lesions.  Cardiovascular:     Rate and Rhythm: Normal rate. Rhythm irregular.     Pulses: Normal pulses.     Heart sounds: Normal heart sounds.  Pulmonary:  Effort: Pulmonary effort is normal.     Breath sounds: Normal breath sounds.  Neurological:     General: No focal deficit present.     Mental Status: He is alert and oriented to person, place, and time.  Psychiatric:        Mood and Affect: Mood normal.        Behavior: Behavior normal.    LABORATORY DATA:  I have reviewed the labs as listed.  CBC Latest Ref Rng & Units 01/21/2021 01/08/2021 12/31/2020  WBC 4.0 - 10.5 K/uL 8.4 13.4(H) 3.0(L)  Hemoglobin 13.0 - 17.0 g/dL 11.5(L) 11.4(L) 11.9(L)  Hematocrit 39.0 - 52.0 % 34.7(L) 34.1(L) 35.9(L)  Platelets 150 - 400 K/uL 169 102(L) 132(L)   CMP Latest Ref Rng & Units 01/14/2021 01/08/2021 12/31/2020  Glucose 70 - 99 mg/dL 135(H) 104(H) 156(H)  BUN 8 - 23 mg/dL 12 11 10   Creatinine 0.61 - 1.24 mg/dL 0.53(L) 0.53(L) 0.42(L)  Sodium 135 - 145 mmol/L 137 137 141  Potassium 3.5 - 5.1 mmol/L 4.2 2.7(LL) 3.3(L)  Chloride 98 - 111 mmol/L 106 102 107  CO2 22 - 32 mmol/L 26 29 28   Calcium 8.9 - 10.3 mg/dL 9.4 9.0 8.9  Total Protein 6.5 - 8.1 g/dL - 5.9(L) 6.0(L)  Total Bilirubin 0.3 - 1.2 mg/dL - 1.0 1.0  Alkaline Phos 38 - 126 U/L - 164(H) 115  AST 15 - 41 U/L - 50(H) 44(H)  ALT 0 - 44 U/L - 36 35    DIAGNOSTIC IMAGING:  I have independently reviewed the scans and discussed with the patient. No results found.   ASSESSMENT:  1.  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  Perirenal  spaces with periaortic/retroperitoneal adenopathy, nodular thickening of the adrenal glands.  Ascites and edematous changes in the mesentery with cirrhotic features, portal enteropathy. - Presentation with 30 pound weight loss in the last 6 months. 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  Ki-67 95%.  Positive CD20, BCL6, CD5 and Bcl-2. - FISH positive for BCL6 and MYC rearrangement. - Cycle 1 of R-CVP on 12/11/2020 - Cycle 2 of R-CVP on 12/31/2020  2.  Stage III (ypT4, N2) rectal cancer: - Presentation in November 2016 with mass at 10 cm from the anus, clinical stage II by EUS-T3N0 - Neoadjuvant Xeloda and radiation from 06/01/2015, completed 20/28 planned fractions, discontinued secondary to toxicity. - Low anterior resection in September 2017, ypT4a, N2  3.  Prostate cancer: - Treated with prostatectomy 1990.  4.  Social/family history: - He lives at home with his wife of 12 years.  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  No family history of malignancies.   PLAN:  1.  Double hit lymphoma: - He is ambulating with the help of walker at home.  He is continuing physical therapy twice weekly. - Reviewed his labs today which showed normal white count and platelet count.  LDH normalized to 178.  LFTs are normal.  Uric acid was 3.5. - Overall he looks significantly improved from last week. - I have recommended adding Adriamycin at 50% dose given the double hit lymphoma status.  We talked about side effects including severe fatigue, cytopenias and rare chance of CHF. - His recent echocardiogram was within normal limits. - We will also add Emend to the regimen. - He is scheduled for PET scan on 02/04/2021.  We will plan to repeat  labs on the same day to see if he needs any fluids. - RTC 3 weeks for follow-up with repeat labs and treatment.  2.  Weight loss: - He lost 30 pounds in the last 6 months. - Continue Megace twice daily. - He  is currently eating better for the last 1 week.  He gained about 5 pounds in the last 1 week.  3.  Hypokalemia: - Continue potassium 20 mEq daily. - Potassium today is 4.0.  4.  Atrial fibrillation: - Continue Eliquis without any bleeding issues.   Orders placed this encounter:  No orders of the defined types were placed in this encounter.    Derek Jack, MD [place] [phone]   I, Thana Ates, am acting as a scribe for Dr. Derek Jack.  I, Derek Jack MD, have reviewed the above documentation for accuracy and completeness, and I agree with the above.

## 2021-01-21 ENCOUNTER — Encounter (HOSPITAL_COMMUNITY): Payer: Self-pay | Admitting: Hematology

## 2021-01-21 ENCOUNTER — Ambulatory Visit: Payer: Medicare Other | Admitting: Oncology

## 2021-01-21 ENCOUNTER — Inpatient Hospital Stay (HOSPITAL_COMMUNITY): Payer: Medicare Other | Admitting: Hematology

## 2021-01-21 ENCOUNTER — Other Ambulatory Visit: Payer: Medicare Other

## 2021-01-21 ENCOUNTER — Other Ambulatory Visit: Payer: Self-pay

## 2021-01-21 ENCOUNTER — Telehealth: Payer: Self-pay

## 2021-01-21 ENCOUNTER — Inpatient Hospital Stay (HOSPITAL_COMMUNITY): Payer: Medicare Other

## 2021-01-21 ENCOUNTER — Ambulatory Visit: Payer: Medicare Other

## 2021-01-21 ENCOUNTER — Ambulatory Visit (HOSPITAL_COMMUNITY): Payer: Medicare Other

## 2021-01-21 ENCOUNTER — Other Ambulatory Visit (HOSPITAL_COMMUNITY): Payer: Medicare Other

## 2021-01-21 VITALS — BP 110/48 | HR 78 | Temp 97.7°F | Resp 18

## 2021-01-21 VITALS — BP 108/49 | HR 88 | Temp 98.4°F | Resp 18 | Wt 138.2 lb

## 2021-01-21 DIAGNOSIS — C8593 Non-Hodgkin lymphoma, unspecified, intra-abdominal lymph nodes: Secondary | ICD-10-CM

## 2021-01-21 DIAGNOSIS — C8513 Unspecified B-cell lymphoma, intra-abdominal lymph nodes: Secondary | ICD-10-CM

## 2021-01-21 DIAGNOSIS — Z5112 Encounter for antineoplastic immunotherapy: Secondary | ICD-10-CM | POA: Diagnosis not present

## 2021-01-21 LAB — COMPREHENSIVE METABOLIC PANEL
ALT: 28 U/L (ref 0–44)
AST: 34 U/L (ref 15–41)
Albumin: 3.1 g/dL — ABNORMAL LOW (ref 3.5–5.0)
Alkaline Phosphatase: 102 U/L (ref 38–126)
Anion gap: 5 (ref 5–15)
BUN: 10 mg/dL (ref 8–23)
CO2: 23 mmol/L (ref 22–32)
Calcium: 9.2 mg/dL (ref 8.9–10.3)
Chloride: 109 mmol/L (ref 98–111)
Creatinine, Ser: 0.64 mg/dL (ref 0.61–1.24)
GFR, Estimated: >60 mL/min (ref >60)
Glucose, Bld: 142 mg/dL — ABNORMAL HIGH (ref 70–99)
Potassium: 4 mmol/L (ref 3.5–5.1)
Sodium: 137 mmol/L (ref 135–145)
Total Bilirubin: 0.6 mg/dL (ref 0.3–1.2)
Total Protein: 6.3 g/dL — ABNORMAL LOW (ref 6.5–8.1)

## 2021-01-21 LAB — CBC WITH DIFFERENTIAL/PLATELET
Abs Immature Granulocytes: 0.04 10*3/uL (ref 0.00–0.07)
Basophils Absolute: 0.1 10*3/uL (ref 0.0–0.1)
Basophils Relative: 1 %
Eosinophils Absolute: 0.1 10*3/uL (ref 0.0–0.5)
Eosinophils Relative: 2 %
HCT: 34.7 % — ABNORMAL LOW (ref 39.0–52.0)
Hemoglobin: 11.5 g/dL — ABNORMAL LOW (ref 13.0–17.0)
Immature Granulocytes: 1 %
Lymphocytes Relative: 8 %
Lymphs Abs: 0.6 10*3/uL — ABNORMAL LOW (ref 0.7–4.0)
MCH: 35.5 pg — ABNORMAL HIGH (ref 26.0–34.0)
MCHC: 33.1 g/dL (ref 30.0–36.0)
MCV: 107.1 fL — ABNORMAL HIGH (ref 80.0–100.0)
Monocytes Absolute: 1 10*3/uL (ref 0.1–1.0)
Monocytes Relative: 12 %
Neutro Abs: 6.4 10*3/uL (ref 1.7–7.7)
Neutrophils Relative %: 76 %
Platelets: 169 10*3/uL (ref 150–400)
RBC: 3.24 MIL/uL — ABNORMAL LOW (ref 4.22–5.81)
RDW: 16.1 % — ABNORMAL HIGH (ref 11.5–15.5)
WBC: 8.4 10*3/uL (ref 4.0–10.5)
nRBC: 0 % (ref 0.0–0.2)

## 2021-01-21 LAB — LACTATE DEHYDROGENASE: LDH: 178 U/L (ref 98–192)

## 2021-01-21 LAB — URIC ACID: Uric Acid, Serum: 3.5 mg/dL — ABNORMAL LOW (ref 3.7–8.6)

## 2021-01-21 LAB — MAGNESIUM: Magnesium: 2.1 mg/dL (ref 1.7–2.4)

## 2021-01-21 MED ORDER — SODIUM CHLORIDE 0.9% FLUSH
10.0000 mL | INTRAVENOUS | Status: DC | PRN
  Administered 2021-01-21: 10 mL

## 2021-01-21 MED ORDER — DIPHENHYDRAMINE HCL 25 MG PO CAPS
25.0000 mg | ORAL_CAPSULE | Freq: Once | ORAL | Status: AC
  Administered 2021-01-21: 25 mg via ORAL
  Filled 2021-01-21: qty 1

## 2021-01-21 MED ORDER — SODIUM CHLORIDE 0.9 % IV SOLN
590.0000 mg/m2 | Freq: Once | INTRAVENOUS | Status: AC
  Administered 2021-01-21: 1000 mg via INTRAVENOUS
  Filled 2021-01-21: qty 50

## 2021-01-21 MED ORDER — SODIUM CHLORIDE 0.9 % IV SOLN
150.0000 mg | Freq: Once | INTRAVENOUS | Status: AC
  Administered 2021-01-21: 150 mg via INTRAVENOUS
  Filled 2021-01-21: qty 150

## 2021-01-21 MED ORDER — SODIUM CHLORIDE 0.9 % IV SOLN
375.0000 mg/m2 | Freq: Once | INTRAVENOUS | Status: AC
  Administered 2021-01-21: 600 mg via INTRAVENOUS
  Filled 2021-01-21: qty 50

## 2021-01-21 MED ORDER — SODIUM CHLORIDE 0.9 % IV SOLN
10.0000 mg | Freq: Once | INTRAVENOUS | Status: AC
  Administered 2021-01-21: 10 mg via INTRAVENOUS
  Filled 2021-01-21: qty 10

## 2021-01-21 MED ORDER — FLUCONAZOLE 100 MG PO TABS: 100.0000 mg | ORAL_TABLET | Freq: Every day | ORAL | 0 refills | Status: AC

## 2021-01-21 MED ORDER — ACETAMINOPHEN 325 MG PO TABS
650.0000 mg | ORAL_TABLET | Freq: Once | ORAL | Status: AC
  Administered 2021-01-21: 650 mg via ORAL
  Filled 2021-01-21: qty 2

## 2021-01-21 MED ORDER — DOXORUBICIN HCL CHEMO IV INJECTION 2 MG/ML
25.0000 mg/m2 | Freq: Once | INTRAVENOUS | Status: AC
  Administered 2021-01-21: 42 mg via INTRAVENOUS
  Filled 2021-01-21: qty 21

## 2021-01-21 MED ORDER — SODIUM CHLORIDE 0.9 % IV SOLN: Freq: Once | INTRAVENOUS | Status: AC

## 2021-01-21 MED ORDER — HEPARIN SOD (PORK) LOCK FLUSH 100 UNIT/ML IV SOLN
250.0000 [IU] | Freq: Once | INTRAVENOUS | Status: AC | PRN
  Administered 2021-01-21: 250 [IU]

## 2021-01-21 MED ORDER — VINCRISTINE SULFATE CHEMO INJECTION 1 MG/ML
1.0000 mg | Freq: Once | INTRAVENOUS | Status: AC
  Administered 2021-01-21: 1 mg via INTRAVENOUS
  Filled 2021-01-21: qty 1

## 2021-01-21 MED ORDER — SODIUM CHLORIDE 0.9 % IV SOLN
8.0000 mg | Freq: Once | INTRAVENOUS | Status: AC
  Administered 2021-01-21: 8 mg via INTRAVENOUS
  Filled 2021-01-21: qty 8

## 2021-01-21 NOTE — Telephone Encounter (Signed)
[phone] Requesting Extended Pt orders for Pt  the orders would be 2x a week for 1 week and starting 01/24/21 1 x a week for 3 weeks per Dr [name] ok with these orders. But informed Shirlean Mylar that for future orders for her to contact Dr Tomie China office

## 2021-01-21 NOTE — Patient Instructions (Signed)
[facility]  Discharge Instructions: Thank you for choosing [facility] to provide your oncology and hematology care.  If you have a lab appointment with the [facility], please come in thru the Main Entrance and check in at the main information desk.  Wear comfortable clothing and clothing appropriate for easy access to any Portacath or PICC line.   We strive to give you quality time with your provider. You may need to reschedule your appointment if you arrive late (15 or more minutes).  Arriving late affects you and other patients whose appointments are after yours.  Also, if you miss three or more appointments without notifying the office, you may be dismissed from the clinic at the provider's discretion.      For prescription refill requests, have your pharmacy contact our office and allow 72 hours for refills to be completed.    Today you received the following chemotherapy and/or immunotherapy agents R-CHOP and Adriamycin.   To help prevent nausea and vomiting after your treatment, we encourage you to take your nausea medication as directed.  BELOW ARE SYMPTOMS THAT SHOULD BE REPORTED IMMEDIATELY: *FEVER GREATER THAN 100.4 F (38 C) OR HIGHER *CHILLS OR SWEATING *NAUSEA AND VOMITING THAT IS NOT CONTROLLED WITH YOUR NAUSEA MEDICATION *UNUSUAL SHORTNESS OF BREATH *UNUSUAL BRUISING OR BLEEDING *URINARY PROBLEMS (pain or burning when urinating, or frequent urination) *BOWEL PROBLEMS (unusual diarrhea, constipation, pain near the anus) TENDERNESS IN MOUTH AND THROAT WITH OR WITHOUT PRESENCE OF ULCERS (sore throat, sores in mouth, or a toothache) UNUSUAL RASH, SWELLING OR PAIN  UNUSUAL VAGINAL DISCHARGE OR ITCHING   Items with * indicate a potential emergency and should be followed up as soon as possible or go to the Emergency Department if any problems should occur.  Please show the CHEMOTHERAPY ALERT CARD or IMMUNOTHERAPY ALERT CARD at check-in to the  Emergency Department and triage nurse.  Should you have questions after your visit or need to cancel or reschedule your appointment, please contact [facility] [phone]  and follow the prompts.  Office hours are 8:00 a.m. to 4:30 p.m. Monday - Friday. Please note that voicemails left after 4:00 p.m. may not be returned until the following business day.  We are closed weekends and major holidays. You have access to a nurse at all times for urgent questions. Please call the main number to the clinic [phone] and follow the prompts.  For any non-urgent questions, you may also contact your provider using MyChart. We now offer e-Visits for anyone 21 and older to request care online for non-urgent symptoms. For details visit [email or web address].   Also download the MyChart app! Go to the app store, search "MyChart", open the app, select [facility], and log in with your MyChart username and password.  Due to Covid, a mask is required upon entering the hospital/clinic. If you do not have a mask, one will be given to you upon arrival. For doctor visits, patients may have 1 support person aged 69 or older with them. For treatment visits, patients cannot have anyone with them due to current Covid guidelines and our immunocompromised population.

## 2021-01-21 NOTE — Progress Notes (Signed)
Patient has been assessed, vital signs and labs have been reviewed by Dr. Delton Coombes. ANC, Creatinine, LFTs, and Platelets are within treatment parameters per Dr. Delton Coombes. The patient is good to proceed with treatment at this time.  To start Adriamycin at 50 % today. Primary RN and pharmacy aware.

## 2021-01-21 NOTE — Patient Instructions (Addendum)
[facility] at [facility] Discharge Instructions  You were seen today by Dr. Katragadda. He went over your recent results, and you received your treatment. Dr. Katragadda will see you back in 3 weeks for labs and follow up.   Thank you for choosing [facility] at [facility] to provide your oncology and hematology care.  To afford each patient quality time with our provider, please arrive at least 15 minutes before your scheduled appointment time.   If you have a lab appointment with the Cancer Center please come in thru the Main Entrance and check in at the main information desk  You need to re-schedule your appointment should you arrive 10 or more minutes late.  We strive to give you quality time with our providers, and arriving late affects you and other patients whose appointments are after yours.  Also, if you no show three or more times for appointments you may be dismissed from the clinic at the providers discretion.     Again, thank you for choosing [facility].  Our hope is that these requests will decrease the amount of time that you wait before being seen by our physicians.       _____________________________________________________________  Should you have questions after your visit to [facility], please contact our office at [phone] between the hours of 8:00 a.m. and 4:30 p.m.  Voicemails left after 4:00 p.m. will not be returned until the following business day.  For prescription refill requests, have your pharmacy contact our office and allow 72 hours.    Cancer Center Support Programs:   > Cancer Support Group  2nd Tuesday of the month 1pm-2pm, Journey Room   

## 2021-01-21 NOTE — Progress Notes (Signed)
Good for treatment today.  Starting Adriamycin at 50 %. Today will be his first adriamycin.

## 2021-01-21 NOTE — Progress Notes (Signed)
Consent attained for Adriamycin. Teaching given and education completed on Adriamycin.  R-CHOP and Adriamycin given today per MD orders. Tolerated infusion without adverse affects. Vital signs stable. No complaints at this time. Discharged from clinic ambulatory in stable condition. Alert and oriented x 3. F/U with [facility] as scheduled.

## 2021-01-22 ENCOUNTER — Inpatient Hospital Stay (HOSPITAL_COMMUNITY): Payer: Medicare Other

## 2021-01-22 VITALS — BP 110/64 | HR 58 | Temp 97.9°F | Resp 18

## 2021-01-22 DIAGNOSIS — C8513 Unspecified B-cell lymphoma, intra-abdominal lymph nodes: Secondary | ICD-10-CM

## 2021-01-22 DIAGNOSIS — Z5112 Encounter for antineoplastic immunotherapy: Secondary | ICD-10-CM | POA: Diagnosis not present

## 2021-01-22 MED ORDER — PEGFILGRASTIM-BMEZ 6 MG/0.6ML ~~LOC~~ SOSY
6.0000 mg | PREFILLED_SYRINGE | Freq: Once | SUBCUTANEOUS | Status: AC
  Administered 2021-01-22: 6 mg via SUBCUTANEOUS
  Filled 2021-01-22: qty 0.6

## 2021-01-22 NOTE — Progress Notes (Signed)
Cody Hurley presents today for Ziextenzo injection per the provider's orders.  Stable during administration without incident; injection site WNL; see MAR for injection details.  Patient tolerated procedure well and without incident.  No questions or complaints noted at this time.

## 2021-01-22 NOTE — Patient Instructions (Signed)
[facility]  Discharge Instructions: Thank you for choosing [facility] to provide your oncology and hematology care.  If you have a lab appointment with the [facility], please come in thru the Main Entrance and check in at the main information desk.  Wear comfortable clothing and clothing appropriate for easy access to any Portacath or PICC line.   We strive to give you quality time with your provider. You may need to reschedule your appointment if you arrive late (15 or more minutes).  Arriving late affects you and other patients whose appointments are after yours.  Also, if you miss three or more appointments without notifying the office, you may be dismissed from the clinic at the provider's discretion.      For prescription refill requests, have your pharmacy contact our office and allow 72 hours for refills to be completed.    Today you received the following chemotherapy and/or immunotherapy agents Ziextenzo injection      To help prevent nausea and vomiting after your treatment, we encourage you to take your nausea medication as directed.  BELOW ARE SYMPTOMS THAT SHOULD BE REPORTED IMMEDIATELY: *FEVER GREATER THAN 100.4 F (38 C) OR HIGHER *CHILLS OR SWEATING *NAUSEA AND VOMITING THAT IS NOT CONTROLLED WITH YOUR NAUSEA MEDICATION *UNUSUAL SHORTNESS OF BREATH *UNUSUAL BRUISING OR BLEEDING *URINARY PROBLEMS (pain or burning when urinating, or frequent urination) *BOWEL PROBLEMS (unusual diarrhea, constipation, pain near the anus) TENDERNESS IN MOUTH AND THROAT WITH OR WITHOUT PRESENCE OF ULCERS (sore throat, sores in mouth, or a toothache) UNUSUAL RASH, SWELLING OR PAIN  UNUSUAL VAGINAL DISCHARGE OR ITCHING   Items with * indicate a potential emergency and should be followed up as soon as possible or go to the Emergency Department if any problems should occur.  Please show the CHEMOTHERAPY ALERT CARD or IMMUNOTHERAPY ALERT CARD at check-in to the  Emergency Department and triage nurse.  Should you have questions after your visit or need to cancel or reschedule your appointment, please contact [facility] [phone]  and follow the prompts.  Office hours are 8:00 a.m. to 4:30 p.m. Monday - Friday. Please note that voicemails left after 4:00 p.m. may not be returned until the following business day.  We are closed weekends and major holidays. You have access to a nurse at all times for urgent questions. Please call the main number to the clinic [phone] and follow the prompts.  For any non-urgent questions, you may also contact your provider using MyChart. We now offer e-Visits for anyone 38 and older to request care online for non-urgent symptoms. For details visit [email or web address].   Also download the MyChart app! Go to the app store, search "MyChart", open the app, select [facility], and log in with your MyChart username and password.  Due to Covid, a mask is required upon entering the hospital/clinic. If you do not have a mask, one will be given to you upon arrival. For doctor visits, patients may have 1 support person aged 87 or older with them. For treatment visits, patients cannot have anyone with them due to current Covid guidelines and our immunocompromised population.

## 2021-01-22 NOTE — Progress Notes (Signed)
24 hour call back.  Patient is doing well.  He has a good appetite, is sleeping good and had no nausea, vomiting or diarrhea.

## 2021-01-29 ENCOUNTER — Encounter (HOSPITAL_COMMUNITY): Payer: Self-pay

## 2021-01-29 ENCOUNTER — Inpatient Hospital Stay (HOSPITAL_COMMUNITY): Payer: Medicare Other | Attending: Hematology

## 2021-01-29 ENCOUNTER — Other Ambulatory Visit: Payer: Self-pay

## 2021-01-29 DIAGNOSIS — I4891 Unspecified atrial fibrillation: Secondary | ICD-10-CM | POA: Insufficient documentation

## 2021-01-29 DIAGNOSIS — E876 Hypokalemia: Secondary | ICD-10-CM | POA: Insufficient documentation

## 2021-01-29 DIAGNOSIS — Z5111 Encounter for antineoplastic chemotherapy: Secondary | ICD-10-CM | POA: Diagnosis present

## 2021-01-29 DIAGNOSIS — Z87891 Personal history of nicotine dependence: Secondary | ICD-10-CM | POA: Insufficient documentation

## 2021-01-29 DIAGNOSIS — Z7901 Long term (current) use of anticoagulants: Secondary | ICD-10-CM | POA: Diagnosis not present

## 2021-01-29 DIAGNOSIS — Z5112 Encounter for antineoplastic immunotherapy: Secondary | ICD-10-CM | POA: Insufficient documentation

## 2021-01-29 DIAGNOSIS — Z85048 Personal history of other malignant neoplasm of rectum, rectosigmoid junction, and anus: Secondary | ICD-10-CM | POA: Insufficient documentation

## 2021-01-29 DIAGNOSIS — Z8546 Personal history of malignant neoplasm of prostate: Secondary | ICD-10-CM | POA: Diagnosis not present

## 2021-01-29 DIAGNOSIS — Z79899 Other long term (current) drug therapy: Secondary | ICD-10-CM | POA: Diagnosis not present

## 2021-01-29 DIAGNOSIS — C8590 Non-Hodgkin lymphoma, unspecified, unspecified site: Secondary | ICD-10-CM | POA: Diagnosis present

## 2021-01-29 DIAGNOSIS — Z5189 Encounter for other specified aftercare: Secondary | ICD-10-CM | POA: Insufficient documentation

## 2021-01-29 DIAGNOSIS — C8593 Non-Hodgkin lymphoma, unspecified, intra-abdominal lymph nodes: Secondary | ICD-10-CM

## 2021-01-29 MED ORDER — SODIUM CHLORIDE 0.9% FLUSH
10.0000 mL | Freq: Once | INTRAVENOUS | Status: AC
  Administered 2021-01-29: 10 mL

## 2021-01-29 MED ORDER — HEPARIN SOD (PORK) LOCK FLUSH 100 UNIT/ML IV SOLN
500.0000 [IU] | Freq: Once | INTRAVENOUS | Status: AC
  Administered 2021-01-29: 500 [IU] via INTRAVENOUS

## 2021-01-29 NOTE — Progress Notes (Signed)
Cody Hurley presented for PICC Flush and dressing change.  See IV assessment in docflowsheets for PICC details. PICC line in the Right arm. PICC line lumen #1 flushed with 66m NS and 250U Heparin. PICC line lumen #2 flushed with 10 ml NS and 250 U Heparin see MAR for further details.  [name].     Vital signs stable. No complaints at this time. Discharged from clinic by wheel chair accompanied by his wife in stable condition. Alert and oriented x 3. [facility]

## 2021-01-29 NOTE — Patient Instructions (Signed)
[facility]  Discharge Instructions: Thank you for choosing [facility] to provide your oncology and hematology care.  If you have a lab appointment with the [facility], please come in thru the Main Entrance and check in at the main information desk.  Wear comfortable clothing and clothing appropriate for easy access to any Portacath or PICC line.   We strive to give you quality time with your provider. You may need to reschedule your appointment if you arrive late (15 or more minutes).  Arriving late affects you and other patients whose appointments are after yours.  Also, if you miss three or more appointments without notifying the office, you may be dismissed from the clinic at the provider's discretion.      For prescription refill requests, have your pharmacy contact our office and allow 72 hours for refills to be completed.    Today you received the following PICC flush and dressing change.       To help prevent nausea and vomiting after your treatment, we encourage you to take your nausea medication as directed.  BELOW ARE SYMPTOMS THAT SHOULD BE REPORTED IMMEDIATELY: *FEVER GREATER THAN 100.4 F (38 C) OR HIGHER *CHILLS OR SWEATING *NAUSEA AND VOMITING THAT IS NOT CONTROLLED WITH YOUR NAUSEA MEDICATION *UNUSUAL SHORTNESS OF BREATH *UNUSUAL BRUISING OR BLEEDING *URINARY PROBLEMS (pain or burning when urinating, or frequent urination) *BOWEL PROBLEMS (unusual diarrhea, constipation, pain near the anus) TENDERNESS IN MOUTH AND THROAT WITH OR WITHOUT PRESENCE OF ULCERS (sore throat, sores in mouth, or a toothache) UNUSUAL RASH, SWELLING OR PAIN  UNUSUAL VAGINAL DISCHARGE OR ITCHING   Items with * indicate a potential emergency and should be followed up as soon as possible or go to the Emergency Department if any problems should occur.  Please show the CHEMOTHERAPY ALERT CARD or IMMUNOTHERAPY ALERT CARD at check-in to the Emergency Department and triage  nurse.  Should you have questions after your visit or need to cancel or reschedule your appointment, please contact [facility] [phone]  and follow the prompts.  Office hours are 8:00 a.m. to 4:30 p.m. Monday - Friday. Please note that voicemails left after 4:00 p.m. may not be returned until the following business day.  We are closed weekends and major holidays. You have access to a nurse at all times for urgent questions. Please call the main number to the clinic [phone] and follow the prompts.  For any non-urgent questions, you may also contact your provider using MyChart. We now offer e-Visits for anyone 85 and older to request care online for non-urgent symptoms. For details visit [email or web address].   Also download the MyChart app! Go to the app store, search "MyChart", open the app, select [facility], and log in with your MyChart username and password.  Due to Covid, a mask is required upon entering the hospital/clinic. If you do not have a mask, one will be given to you upon arrival. For doctor visits, patients may have 1 support person aged 85 or older with them. For treatment visits, patients cannot have anyone with them due to current Covid guidelines and our immunocompromised population.

## 2021-01-31 ENCOUNTER — Other Ambulatory Visit: Payer: Self-pay | Admitting: Nurse Practitioner

## 2021-01-31 DIAGNOSIS — C8593 Non-Hodgkin lymphoma, unspecified, intra-abdominal lymph nodes: Secondary | ICD-10-CM

## 2021-02-03 ENCOUNTER — Other Ambulatory Visit: Payer: Self-pay | Admitting: Oncology

## 2021-02-04 ENCOUNTER — Inpatient Hospital Stay (HOSPITAL_COMMUNITY): Payer: Medicare Other

## 2021-02-04 ENCOUNTER — Other Ambulatory Visit: Payer: Self-pay

## 2021-02-04 ENCOUNTER — Ambulatory Visit (HOSPITAL_COMMUNITY)
Admission: RE | Admit: 2021-02-04 | Discharge: 2021-02-04 | Disposition: A | Discharge status: Home / Self Care | Payer: Medicare Other | Source: Ambulatory Visit | Attending: Hematology | Admitting: Hematology

## 2021-02-04 DIAGNOSIS — C8593 Non-Hodgkin lymphoma, unspecified, intra-abdominal lymph nodes: Secondary | ICD-10-CM | POA: Diagnosis present

## 2021-02-04 DIAGNOSIS — C8590 Non-Hodgkin lymphoma, unspecified, unspecified site: Secondary | ICD-10-CM | POA: Diagnosis not present

## 2021-02-04 LAB — COMPREHENSIVE METABOLIC PANEL
ALT: 27 U/L (ref 0–44)
AST: 30 U/L (ref 15–41)
Albumin: 3.5 g/dL (ref 3.5–5.0)
Alkaline Phosphatase: 98 U/L (ref 38–126)
Anion gap: 6 (ref 5–15)
BUN: 15 mg/dL (ref 8–23)
CO2: 23 mmol/L (ref 22–32)
Calcium: 9.7 mg/dL (ref 8.9–10.3)
Chloride: 111 mmol/L (ref 98–111)
Creatinine, Ser: 0.65 mg/dL (ref 0.61–1.24)
GFR, Estimated: >60 mL/min (ref >60)
Glucose, Bld: 103 mg/dL — ABNORMAL HIGH (ref 70–99)
Potassium: 3.7 mmol/L (ref 3.5–5.1)
Sodium: 140 mmol/L (ref 135–145)
Total Bilirubin: 0.8 mg/dL (ref 0.3–1.2)
Total Protein: 6.8 g/dL (ref 6.5–8.1)

## 2021-02-04 LAB — CBC WITH DIFFERENTIAL/PLATELET
Abs Immature Granulocytes: 0.11 10*3/uL — ABNORMAL HIGH (ref 0.00–0.07)
Basophils Absolute: 0.1 10*3/uL (ref 0.0–0.1)
Basophils Relative: 2 %
Eosinophils Absolute: 0.2 10*3/uL (ref 0.0–0.5)
Eosinophils Relative: 3 %
HCT: 37.9 % — ABNORMAL LOW (ref 39.0–52.0)
Hemoglobin: 12.1 g/dL — ABNORMAL LOW (ref 13.0–17.0)
Immature Granulocytes: 2 %
Lymphocytes Relative: 9 %
Lymphs Abs: 0.6 10*3/uL — ABNORMAL LOW (ref 0.7–4.0)
MCH: 35.3 pg — ABNORMAL HIGH (ref 26.0–34.0)
MCHC: 31.9 g/dL (ref 30.0–36.0)
MCV: 110.5 fL — ABNORMAL HIGH (ref 80.0–100.0)
Monocytes Absolute: 0.9 10*3/uL (ref 0.1–1.0)
Monocytes Relative: 14 %
Neutro Abs: 4.4 10*3/uL (ref 1.7–7.7)
Neutrophils Relative %: 70 %
Platelets: 194 10*3/uL (ref 150–400)
RBC: 3.43 MIL/uL — ABNORMAL LOW (ref 4.22–5.81)
RDW: 16.1 % — ABNORMAL HIGH (ref 11.5–15.5)
WBC: 6.3 10*3/uL (ref 4.0–10.5)
nRBC: 0 % (ref 0.0–0.2)

## 2021-02-04 LAB — URIC ACID: Uric Acid, Serum: 3.6 mg/dL — ABNORMAL LOW (ref 3.7–8.6)

## 2021-02-04 LAB — LACTATE DEHYDROGENASE: LDH: 172 U/L (ref 98–192)

## 2021-02-04 LAB — MAGNESIUM: Magnesium: 2.2 mg/dL (ref 1.7–2.4)

## 2021-02-04 MED ORDER — HEPARIN SOD (PORK) LOCK FLUSH 100 UNIT/ML IV SOLN
500.0000 [IU] | Freq: Once | INTRAVENOUS | Status: AC
  Administered 2021-02-04: 500 [IU] via INTRAVENOUS

## 2021-02-04 MED ORDER — FLUDEOXYGLUCOSE F - 18 (FDG) INJECTION
7.5690 | Freq: Once | INTRAVENOUS | Status: AC | PRN
  Administered 2021-02-04: 7.569 via INTRAVENOUS

## 2021-02-04 MED ORDER — SODIUM CHLORIDE 0.9% FLUSH
10.0000 mL | Freq: Once | INTRAVENOUS | Status: AC
  Administered 2021-02-04: 10 mL

## 2021-02-04 NOTE — Progress Notes (Signed)
Pt presents today for PICC line dressing and flush per provider's order. Both lumens flushed easily had good blood return. Lumens flushed with 4m NS each and 250 units of heparin.Vital signs stable and pt voiced no new complaints at this time.  Pt did not need IV fluids today labs are within normal parameters per Dr.Katragadda and pt reported he is eating and drinking well. Pt does not have to come tomorrow due to dressing change and PICC flush today.    PICC line dressing change and flush given today per MD orders. Tolerated infusion without adverse affects. Vital signs stable. No complaints at this time. Discharged from clinic via wheelchair in stable condition. Alert and oriented x 3. [facility]

## 2021-02-04 NOTE — Patient Instructions (Signed)
[place]  Discharge Instructions: Thank you for choosing [facility] to provide your oncology and hematology care.  If you have a lab appointment with the [facility], please come in thru the Main Entrance and check in at the main information desk.  Wear comfortable clothing and clothing appropriate for easy access to any Portacath or PICC line.   We strive to give you quality time with your provider. You may need to reschedule your appointment if you arrive late (15 or more minutes).  Arriving late affects you and other patients whose appointments are after yours.  Also, if you miss three or more appointments without notifying the office, you may be dismissed from the clinic at the provider's discretion.      For prescription refill requests, have your pharmacy contact our office and allow 72 hours for refills to be complete Today you received PICC line dressing change and PICC flush.     BELOW ARE SYMPTOMS THAT SHOULD BE REPORTED IMMEDIATELY: *FEVER GREATER THAN 100.4 F (38 C) OR HIGHER *CHILLS OR SWEATING *NAUSEA AND VOMITING THAT IS NOT CONTROLLED WITH YOUR NAUSEA MEDICATION *UNUSUAL SHORTNESS OF BREATH *UNUSUAL BRUISING OR BLEEDING *URINARY PROBLEMS (pain or burning when urinating, or frequent urination) *BOWEL PROBLEMS (unusual diarrhea, constipation, pain near the anus) TENDERNESS IN MOUTH AND THROAT WITH OR WITHOUT PRESENCE OF ULCERS (sore throat, sores in mouth, or a toothache) UNUSUAL RASH, SWELLING OR PAIN  UNUSUAL VAGINAL DISCHARGE OR ITCHING   Items with * indicate a potential emergency and should be followed up as soon as possible or go to the Emergency Department if any problems should occur.  Please show the CHEMOTHERAPY ALERT CARD or IMMUNOTHERAPY ALERT CARD at check-in to the Emergency Department and triage nurse.  Should you have questions after your visit or need to cancel or reschedule your appointment, please contact [facility] [phone]  and follow the prompts.  Office hours are 8:00 a.m. to 4:30 p.m. Monday - Friday. Please note that voicemails left after 4:00 p.m. may not be returned until the following business day.  We are closed weekends and major holidays. You have access to a nurse at all times for urgent questions. Please call the main number to the clinic [phone] and follow the prompts.  For any non-urgent questions, you may also contact your provider using MyChart. We now offer e-Visits for anyone 48 and older to request care online for non-urgent symptoms. For details visit [email or web address].   Also download the MyChart app! Go to the app store, search "MyChart", open the app, select [facility], and log in with your MyChart username and password.  Due to Covid, a mask is required upon entering the hospital/clinic. If you do not have a mask, one will be given to you upon arrival. For doctor visits, patients may have 1 support person aged 35 or older with them. For treatment visits, patients cannot have anyone with them due to current Covid guidelines and our immunocompromised population.

## 2021-02-05 ENCOUNTER — Other Ambulatory Visit (HOSPITAL_COMMUNITY): Payer: Self-pay | Admitting: Hematology

## 2021-02-05 ENCOUNTER — Other Ambulatory Visit (HOSPITAL_COMMUNITY): Payer: Medicare Other

## 2021-02-08 ENCOUNTER — Encounter: Payer: Self-pay | Admitting: Oncology

## 2021-02-11 ENCOUNTER — Ambulatory Visit: Payer: Medicare Other | Admitting: Nurse Practitioner

## 2021-02-11 ENCOUNTER — Other Ambulatory Visit: Payer: Medicare Other

## 2021-02-11 ENCOUNTER — Inpatient Hospital Stay (HOSPITAL_COMMUNITY): Payer: Medicare Other | Admitting: Dietician

## 2021-02-11 ENCOUNTER — Other Ambulatory Visit: Payer: Self-pay

## 2021-02-11 ENCOUNTER — Encounter (HOSPITAL_COMMUNITY): Payer: Self-pay | Admitting: Hematology

## 2021-02-11 ENCOUNTER — Other Ambulatory Visit (HOSPITAL_COMMUNITY): Payer: Self-pay | Admitting: *Deleted

## 2021-02-11 ENCOUNTER — Inpatient Hospital Stay (HOSPITAL_COMMUNITY): Payer: Medicare Other

## 2021-02-11 ENCOUNTER — Inpatient Hospital Stay (HOSPITAL_COMMUNITY): Payer: Medicare Other | Admitting: Hematology

## 2021-02-11 ENCOUNTER — Ambulatory Visit: Payer: Medicare Other

## 2021-02-11 VITALS — BP 115/66 | HR 78 | Temp 97.9°F | Resp 18 | Wt 143.4 lb

## 2021-02-11 VITALS — BP 119/63 | HR 72 | Temp 97.8°F | Resp 18

## 2021-02-11 DIAGNOSIS — C8593 Non-Hodgkin lymphoma, unspecified, intra-abdominal lymph nodes: Secondary | ICD-10-CM

## 2021-02-11 DIAGNOSIS — C8590 Non-Hodgkin lymphoma, unspecified, unspecified site: Secondary | ICD-10-CM | POA: Diagnosis not present

## 2021-02-11 DIAGNOSIS — C8513 Unspecified B-cell lymphoma, intra-abdominal lymph nodes: Secondary | ICD-10-CM

## 2021-02-11 LAB — COMPREHENSIVE METABOLIC PANEL
ALT: 25 U/L (ref 0–44)
AST: 30 U/L (ref 15–41)
Albumin: 3.2 g/dL — ABNORMAL LOW (ref 3.5–5.0)
Alkaline Phosphatase: 69 U/L (ref 38–126)
Anion gap: 5 (ref 5–15)
BUN: 14 mg/dL (ref 8–23)
CO2: 25 mmol/L (ref 22–32)
Calcium: 9 mg/dL (ref 8.9–10.3)
Chloride: 111 mmol/L (ref 98–111)
Creatinine, Ser: 0.6 mg/dL — ABNORMAL LOW (ref 0.61–1.24)
GFR, Estimated: >60 mL/min (ref >60)
Glucose, Bld: 176 mg/dL — ABNORMAL HIGH (ref 70–99)
Potassium: 3.4 mmol/L — ABNORMAL LOW (ref 3.5–5.1)
Sodium: 141 mmol/L (ref 135–145)
Total Bilirubin: 0.7 mg/dL (ref 0.3–1.2)
Total Protein: 6.1 g/dL — ABNORMAL LOW (ref 6.5–8.1)

## 2021-02-11 LAB — CBC WITH DIFFERENTIAL/PLATELET
Abs Immature Granulocytes: 0.02 10*3/uL (ref 0.00–0.07)
Basophils Absolute: 0.1 10*3/uL (ref 0.0–0.1)
Basophils Relative: 2 %
Eosinophils Absolute: 0.1 10*3/uL (ref 0.0–0.5)
Eosinophils Relative: 2 %
HCT: 35.7 % — ABNORMAL LOW (ref 39.0–52.0)
Hemoglobin: 11.8 g/dL — ABNORMAL LOW (ref 13.0–17.0)
Immature Granulocytes: 0 %
Lymphocytes Relative: 7 %
Lymphs Abs: 0.4 10*3/uL — ABNORMAL LOW (ref 0.7–4.0)
MCH: 35.9 pg — ABNORMAL HIGH (ref 26.0–34.0)
MCHC: 33.1 g/dL (ref 30.0–36.0)
MCV: 108.5 fL — ABNORMAL HIGH (ref 80.0–100.0)
Monocytes Absolute: 0.8 10*3/uL (ref 0.1–1.0)
Monocytes Relative: 15 %
Neutro Abs: 4.1 10*3/uL (ref 1.7–7.7)
Neutrophils Relative %: 74 %
Platelets: 148 10*3/uL — ABNORMAL LOW (ref 150–400)
RBC: 3.29 MIL/uL — ABNORMAL LOW (ref 4.22–5.81)
RDW: 15.6 % — ABNORMAL HIGH (ref 11.5–15.5)
WBC: 5.6 10*3/uL (ref 4.0–10.5)
nRBC: 0 % (ref 0.0–0.2)

## 2021-02-11 LAB — MAGNESIUM: Magnesium: 2.2 mg/dL (ref 1.7–2.4)

## 2021-02-11 MED ORDER — SODIUM CHLORIDE 0.9 % IV SOLN
375.0000 mg/m2 | Freq: Once | INTRAVENOUS | Status: AC
  Administered 2021-02-11: 600 mg via INTRAVENOUS
  Filled 2021-02-11: qty 50

## 2021-02-11 MED ORDER — SODIUM CHLORIDE 0.9% FLUSH
10.0000 mL | INTRAVENOUS | Status: DC | PRN
  Administered 2021-02-11: 10 mL

## 2021-02-11 MED ORDER — SODIUM CHLORIDE 0.9 % IV SOLN: Freq: Once | INTRAVENOUS | Status: AC

## 2021-02-11 MED ORDER — SODIUM CHLORIDE 0.9% FLUSH
10.0000 mL | INTRAVENOUS | Status: DC | PRN
  Administered 2021-02-11: 10 mL via INTRAVENOUS

## 2021-02-11 MED ORDER — SODIUM CHLORIDE 0.9 % IV SOLN
150.0000 mg | Freq: Once | INTRAVENOUS | Status: AC
  Administered 2021-02-11: 150 mg via INTRAVENOUS
  Filled 2021-02-11: qty 150

## 2021-02-11 MED ORDER — DIPHENHYDRAMINE HCL 25 MG PO CAPS
25.0000 mg | ORAL_CAPSULE | Freq: Once | ORAL | Status: AC
  Administered 2021-02-11: 25 mg via ORAL
  Filled 2021-02-11: qty 1

## 2021-02-11 MED ORDER — HEPARIN SOD (PORK) LOCK FLUSH 100 UNIT/ML IV SOLN
500.0000 [IU] | Freq: Once | INTRAVENOUS | Status: AC | PRN
  Administered 2021-02-11: 500 [IU]

## 2021-02-11 MED ORDER — ZOLPIDEM TARTRATE 5 MG PO TABS: 5.0000 mg | ORAL_TABLET | Freq: Every evening | ORAL | 0 refills | Status: DC | PRN

## 2021-02-11 MED ORDER — VINCRISTINE SULFATE CHEMO INJECTION 1 MG/ML
1.0000 mg | Freq: Once | INTRAVENOUS | Status: AC
  Administered 2021-02-11: 1 mg via INTRAVENOUS
  Filled 2021-02-11: qty 1

## 2021-02-11 MED ORDER — APIXABAN 2.5 MG PO TABS: 2.5000 mg | ORAL_TABLET | Freq: Two times a day (BID) | ORAL | 0 refills | Status: DC

## 2021-02-11 MED ORDER — DOXORUBICIN HCL CHEMO IV INJECTION 2 MG/ML
25.0000 mg/m2 | Freq: Once | INTRAVENOUS | Status: AC
  Administered 2021-02-11: 42 mg via INTRAVENOUS
  Filled 2021-02-11: qty 21

## 2021-02-11 MED ORDER — SODIUM CHLORIDE 0.9 % IV SOLN
375.0000 mg/m2 | Freq: Once | INTRAVENOUS | Status: DC
  Filled 2021-02-11: qty 60

## 2021-02-11 MED ORDER — SODIUM CHLORIDE 0.9 % IV SOLN
10.0000 mg | Freq: Once | INTRAVENOUS | Status: AC
  Administered 2021-02-11: 10 mg via INTRAVENOUS
  Filled 2021-02-11: qty 10

## 2021-02-11 MED ORDER — ACETAMINOPHEN 325 MG PO TABS
650.0000 mg | ORAL_TABLET | Freq: Once | ORAL | Status: AC
  Administered 2021-02-11: 650 mg via ORAL
  Filled 2021-02-11: qty 2

## 2021-02-11 MED ORDER — SODIUM CHLORIDE 0.9 % IV SOLN
8.0000 mg | Freq: Once | INTRAVENOUS | Status: AC
  Administered 2021-02-11: 8 mg via INTRAVENOUS
  Filled 2021-02-11: qty 8

## 2021-02-11 MED ORDER — SODIUM CHLORIDE 0.9 % IV SOLN
590.0000 mg/m2 | Freq: Once | INTRAVENOUS | Status: AC
  Administered 2021-02-11: 1000 mg via INTRAVENOUS
  Filled 2021-02-11: qty 50

## 2021-02-11 NOTE — Progress Notes (Signed)
Patient has been examined, vital signs and labs have been reviewed by Dr. Katragadda. ANC, Creatinine, LFTs, hemoglobin, and platelets are within treatment parameters per Dr. Katragadda. Patient is okay to proceed with treatment per M.D.   

## 2021-02-11 NOTE — Progress Notes (Signed)
Cody Hurley presented for PICC dressing chang and flush.  See IV assessment in docflowsheets for PICC details.   PICC located right arm.  Good blood return present. PICC flushed with 59m NS and 250U Heparin, see MAR for further details.  [name].

## 2021-02-11 NOTE — Patient Instructions (Addendum)
[facility] at [facility] Discharge Instructions  You were seen today by Dr. Katragadda. He went over your recent results and scans, and you received your treatment. Dr. Katragadda will see you back in 3 weeks for labs and follow up.   Thank you for choosing [facility] at [facility] to provide your oncology and hematology care.  To afford each patient quality time with our provider, please arrive at least 15 minutes before your scheduled appointment time.   If you have a lab appointment with the Cancer Center please come in thru the Main Entrance and check in at the main information desk  You need to re-schedule your appointment should you arrive 10 or more minutes late.  We strive to give you quality time with our providers, and arriving late affects you and other patients whose appointments are after yours.  Also, if you no show three or more times for appointments you may be dismissed from the clinic at the providers discretion.     Again, thank you for choosing [facility].  Our hope is that these requests will decrease the amount of time that you wait before being seen by our physicians.       _____________________________________________________________  Should you have questions after your visit to  Cancer Center, please contact our office at [phone] between the hours of 8:00 a.m. and 4:30 p.m.  Voicemails left after 4:00 p.m. will not be returned until the following business day.  For prescription refill requests, have your pharmacy contact our office and allow 72 hours.    Cancer Center Support Programs:   > Cancer Support Group  2nd Tuesday of the month 1pm-2pm, Journey Room   

## 2021-02-11 NOTE — Progress Notes (Signed)
R-CHOP given today per MD orders. Tolerated infusion without adverse affects. Vital signs stable. No complaints at this time. Discharged from clinic via wheelchair in stable condition. Alert and oriented x 3. F/U with [facility] as scheduled.

## 2021-02-11 NOTE — Progress Notes (Signed)
Cody Hurley [address] [place] [address]   CLINIC:  Medical Oncology/Hematology  PCP:  [name], MD [address] [place] [address] [phone]   REASON FOR VISIT:  Follow-up for Non-Hodgkin's lymphoma and history of rectal cancer  PRIOR THERAPY: none  NGS Results: not done  CURRENT THERAPY: CVP + Rituximab every 3 weeks  BRIEF ONCOLOGIC HISTORY:  Oncology History  Non-Hodgkin lymphoma ([facility])  12/09/2020 Initial Diagnosis   Non-Hodgkin lymphoma ([place])   12/11/2020 -  Chemotherapy    Patient is on Treatment Plan: NON-HODGKINS LYMPHOMA CVP + RITUXIMAB Q21D         CANCER STAGING: Cancer Staging No matching staging information was found for the patient.  INTERVAL HISTORY:  [name], a 87 y.o. male, returns for routine follow-up and consideration for next cycle of chemotherapy. Cody Hurley was last seen on 01/21/2021.  Due for cycle #4 of CVP + Rituximab today.   Overall, he tells me he has been feeling pretty well. He reports stable fatigue, and his appetite is excellent. He reports difficulties falling asleep and changes in mood, and he denies SOB, n/v/d/c, and numbness/tingling. His activity levels have improved.   Overall, he feels ready for next cycle of chemo today.   REVIEW OF SYSTEMS:  Review of Systems  Constitutional:  Positive for fatigue (depleted). Negative for appetite change.  Respiratory:  Positive for cough (yellow sputum). Negative for shortness of breath.   Cardiovascular:  Positive for leg swelling (feet).  Gastrointestinal:  Negative for constipation, diarrhea, nausea and vomiting.  Musculoskeletal:  Positive for arthralgias (7/10 tail bone).  Neurological:  Negative for numbness.  Psychiatric/Behavioral:  Positive for sleep disturbance.   All other systems reviewed and are negative.  PAST MEDICAL/SURGICAL HISTORY:  Past Medical History:  Diagnosis Date   Colorectal cancer ([place]) 2016   Prostate  cancer [facility])    Past Surgical History:  Procedure Laterality Date   COLOSTOMY     MELANOMA EXCISION     ear   PROSTATE SURGERY  1991    SOCIAL HISTORY:  Social History   Socioeconomic History   Marital status: Married    Spouse name: Not on file   Number of children: Not on file   Years of education: Not on file   Highest education level: Not on file  Occupational History   Not on file  Tobacco Use   Smoking status: Former    Packs/day: 0.50    Types: Cigarettes    Quit date: 21    Years since quitting: 74.7   Smokeless tobacco: Never  Vaping Use   Vaping Use: Never used  Substance and Sexual Activity   Alcohol use: Never   Drug use: Never   Sexual activity: Not on file  Other Topics Concern   Not on file  Social History Narrative   Not on file   Social Determinants of Health   Financial Resource Strain: Low Risk    Difficulty of Paying Living Expenses: Not very hard  Food Insecurity: No Food Insecurity   Worried About Charity fundraiser in the Last Year: Never true   [place] in the Last Year: Never true  Transportation Needs: No Transportation Needs   Lack of Transportation (Medical): No   Lack of Transportation (Non-Medical): No  Physical Activity: Inactive   Days of Exercise per Week: 0 days   Minutes of Exercise per Session: 0 min  Stress: No Stress Concern  Present   Feeling of Stress : Not at all  Social Connections: Moderately Integrated   Frequency of Communication with Friends and Family: Three times a week   Frequency of Social Gatherings with Friends and Family: Three times a week   Attends Religious Services: 1 to 4 times per year   Active Member of Clubs or Organizations: No   Attends Archivist Meetings: Never   Marital Status: Married  Human resources officer Violence: Not At Risk   Fear of Current or Ex-Partner: No   Emotionally Abused: No   Physically Abused: No   Sexually Abused: No    FAMILY HISTORY:  History  reviewed. No pertinent family history.  CURRENT MEDICATIONS:  Current Outpatient Medications  Medication Sig Dispense Refill   acetaminophen (TYLENOL) 500 MG tablet Take 500-1,000 mg by mouth every 6 (six) hours as needed for mild pain (or headaches).     Cholecalciferol (VITAMIN D-3) 25 MCG (1000 UT) CAPS Take 2,000 Units by mouth daily with breakfast.     feeding supplement (ENSURE ENLIVE / ENSURE PLUS) LIQD Take 237 mLs by mouth 2 (two) times daily between meals. 237 mL 12   heparin lock flush 100 UNIT/ML SOLN injection 2.5 mLs (250 Units total) by Intracatheter route 2 (two) times a week. Inject 2.5 ml to each lumen of PICC line twice weekly after NS flush 120 mL 2   megestrol (MEGACE) 40 MG/ML suspension TAKE 10 MLS (400 MG TOTAL) BY MOUTH 2 (TWO) TIMES DAILY. 480 mL 2   Multiple Vitamin (MULTIVITAMIN) tablet Take 1 tablet by mouth daily.     nystatin (MYCOSTATIN) 100000 UNIT/ML suspension Take 5 mLs (500,000 Units total) by mouth 4 (four) times daily. 60 mL 0   oxybutynin (DITROPAN) 5 MG tablet Take 5 mg by mouth daily.     potassium chloride SA (KLOR-CON) 20 MEQ tablet Take 2 tabs daily for 3 days, then 1 tab daily 30 tablet 0   sodium chloride flush 0.9 % SOLN injection 10 mLs by Intracatheter route 2 (two) times a week. Inject into both PICC line ports twice weekly 480 mL 2   vitamin B-12 (CYANOCOBALAMIN) 100 MCG tablet Take 100 mcg by mouth daily.     apixaban (ELIQUIS) 2.5 MG TABS tablet Take 1 tablet (2.5 mg total) by mouth 2 (two) times daily. (Patient not taking: Reported on 02/11/2021) 60 tablet 0   No current facility-administered medications for this visit.    ALLERGIES:  Allergies  Allergen Reactions   Penicillin G Rash    PHYSICAL EXAM:  Performance status (ECOG): 2 - Symptomatic, <50% confined to bed  Vitals:   02/11/21 0804  BP: 115/66  Pulse: 78  Resp: 18  Temp: 97.9 F (36.6 C)  SpO2: 98%   Wt Readings from Last 3 Encounters:  02/11/21 143 lb 6.4 oz (65  kg)  01/21/21 138 lb 3.7 oz (62.7 kg)  01/14/21 133 lb 11.2 oz (60.6 kg)   Physical Exam Vitals reviewed.  Constitutional:      Appearance: Normal appearance.  Cardiovascular:     Rate and Rhythm: Normal rate. Rhythm irregular.     Pulses: Normal pulses.     Heart sounds: Normal heart sounds.  Pulmonary:     Effort: Pulmonary effort is normal.     Breath sounds: Normal breath sounds.  Musculoskeletal:     Right lower leg: 1+ Edema present.     Left lower leg: 1+ Edema present.  Neurological:     General: No  focal deficit present.     Mental Status: He is alert and oriented to person, place, and time.  Psychiatric:        Mood and Affect: Mood normal.        Behavior: Behavior normal.    LABORATORY DATA:  I have reviewed the labs as listed.  CBC Latest Ref Rng & Units 02/11/2021 02/04/2021 01/21/2021  WBC 4.0 - 10.5 K/uL 5.6 6.3 8.4  Hemoglobin 13.0 - 17.0 g/dL 11.8(L) 12.1(L) 11.5(L)  Hematocrit 39.0 - 52.0 % 35.7(L) 37.9(L) 34.7(L)  Platelets 150 - 400 K/uL 148(L) 194 169   CMP Latest Ref Rng & Units 02/11/2021 02/04/2021 01/21/2021  Glucose 70 - 99 mg/dL 176(H) 103(H) 142(H)  BUN 8 - 23 mg/dL $Remove'14 15 10  'FYpfnGA$ Creatinine 0.61 - 1.24 mg/dL 0.60(L) 0.65 0.64  Sodium 135 - 145 mmol/L 141 140 137  Potassium 3.5 - 5.1 mmol/L 3.4(L) 3.7 4.0  Chloride 98 - 111 mmol/L 111 111 109  CO2 22 - 32 mmol/L $RemoveB'25 23 23  'pmOyWQrn$ Calcium 8.9 - 10.3 mg/dL 9.0 9.7 9.2  Total Protein 6.5 - 8.1 g/dL 6.1(L) 6.8 6.3(L)  Total Bilirubin 0.3 - 1.2 mg/dL 0.7 0.8 0.6  Alkaline Phos 38 - 126 U/L 69 98 102  AST 15 - 41 U/L 30 30 34  ALT 0 - 44 U/L $Remo'25 27 28    'lUQtD$ DIAGNOSTIC IMAGING:  I have independently reviewed the scans and discussed with the patient. NM PET Image Initial (PI) Skull Base To Thigh (F-18 FDG)  Result Date: 02/05/2021 CLINICAL DATA:  Initial treatment strategy for non-Hodgkin's lymphoma. History of rectal cancer. EXAM: NUCLEAR MEDICINE PET SKULL BASE TO THIGH TECHNIQUE: 7.6 mCi F-18 FDG was injected  intravenously. Full-ring PET imaging was performed from the skull base to thigh after the radiotracer. CT data was obtained and used for attenuation correction and anatomic localization. Fasting blood glucose: 104 mg/dl COMPARISON:  Multiple exams, including CT scan 12/06/2020 FINDINGS: Mediastinal blood pool activity: SUV max 1.2 Liver activity: SUV max 1.6 NECK: No significant abnormal hypermetabolic activity in this region. Incidental CT findings: Bilateral common carotid atherosclerotic calcification. CHEST: No significant abnormal hypermetabolic activity in this region. Incidental CT findings: Right PICC line tip: Cavoatrial junction. Coronary, aortic arch, and branch vessel atherosclerotic vascular disease. Small type 1 hiatal hernia. Mild mosaic attenuation in the lungs. Mild scarring or atelectasis in the lingula. ABDOMEN/PELVIS: Marked reduction in the perinephric nodularity compatible with substantial response to therapy. Currently there is faint stranding and subtle infiltrative nodularity at the site of prior solid perirenal space tumor. Below the left kidney, the most confluent area of residual stranding has a maximum SUV of 1.0, Deauville 2. Marked improvement in the previous periaortic adenopathy. A residual left posterior periaortic lymph node measures 0.5 cm in short axis on image 153 series 3 (formerly 2.1 cm) and with maximum SUV in this vicinity of 1.4 (Deauville 3). The bilateral adrenal masses have essentially resolved (Deauville 1). No new regions of lymphoma are identified. Incidental CT findings: Cholelithiasis. Atherosclerosis is present, including aortoiliac atherosclerotic disease. Colonic diverticulosis. Left lower quadrant colostomy with peristomal hernia containing a loop of small bowel but without findings of strangulation or obstruction. Rectal pouch noted with therapy related presacral density which does not demonstrate hypermetabolic activity. Cirrhosis. SKELETON: No significant  abnormal hypermetabolic activity in this region. Incidental CT findings: Thoracic kyphosis. Grade 1 degenerative anterolisthesis of L4 on L5. Degenerative disc disease and endplate sclerosis at R8-X0. IMPRESSION: 1. Marked improvement in the prior  adrenal and right perirenal masses as well as the retroperitoneal adenopathy. The residual mild right perirenal infiltrative stranding is Deauville 2 and the 0.5 cm residual left periaortic lymph node is Deauville 3. The adrenal masses have resolved (Deauville 1). 2. Postoperative findings from low anterior resection with left lower quadrant ostomy and small peristomal hernia containing a loop of small bowel without findings of strangulation or obstruction. 3. Mild mosaic attenuation in the lungs is nonspecific but could be from low-level edema, air trapping, or extrinsic allergic alveolitis. 4. Other imaging findings of potential clinical significance: Aortic Atherosclerosis (ICD10-I70.0). Coronary atherosclerosis. Small type 1 hiatal hernia. Systemic atherosclerosis. Cholelithiasis. Colonic diverticulosis. Hepatic cirrhosis. Thoracic kyphosis. Electronically Signed   By: Van Clines M.D.   On: 02/05/2021 16:55     ASSESSMENT:  1.  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  Perirenal spaces with periaortic/retroperitoneal adenopathy, nodular thickening of the adrenal glands.  Ascites and edematous changes in the mesentery with cirrhotic features, portal enteropathy. - Presentation with 30 pound weight loss in the last 6 months. 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  Ki-67 95%.  Positive CD20, BCL6, CD5 and Bcl-2. - FISH positive for BCL6 and MYC rearrangement. - Cycle 1 of R-CVP on 12/11/2020 - Cycle 2 of R-CVP on 12/31/2020  2.  Stage III (ypT4, N2) rectal cancer: - Presentation in November 2016 with mass at 10 cm from  the anus, clinical stage II by EUS-[id number] - Neoadjuvant Xeloda and radiation from 06/01/2015, completed 20/28 planned fractions, discontinued secondary to toxicity. - Low anterior resection in September 2017, ypT4a, N2  3.  Prostate cancer: - Treated with prostatectomy 1990.  4.  Social/family history: - He lives at home with his wife of 12 years.  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  No family history of malignancies.   PLAN:  1.  Double hit lymphoma: -He has completed 3 cycles of chemotherapy.  I have added Adriamycin during cycle 3.  He did not have any major side effects. - Reviewed PET scan from 02/04/2021 with marked improvement in the prior adrenal and right perirenal masses as well as retroperitoneal adenopathy.  There is residual mild disease, Deauville 2 and left periaortic lymph node level 3.  No new lesions seen. - Reviewed labs from today which showed CBC has been stable with normal white count and platelet count.  LFTs are grossly normal.  Albumin is low at 3.2. - We will proceed with cycle 4 today.  We will include Adriamycin at half dose. - RTC 3 weeks for follow-up with repeat labs.   2.  Weight loss: -He lost 30 pounds in the last 6 months. - Megace is helping him eat well.  Continue Megace twice daily.  He has gained few pounds.  3.  Hypokalemia: -Continue potassium 20 mg daily.  Potassium today is 3.4.  4.  Atrial fibrillation: -He reportedly ran out of Eliquis.  We will send new prescription.  No bleeding issues reported.  5.  Sleeping difficulty: - We will start him Ambien 5 mg at bedtime as needed.   Orders placed this encounter:  No orders of the defined types were placed in this encounter.    Derek Jack, MD [facility] [phone]   I, Thana Ates, am acting as a scribe for Dr. Derek Jack.  I, Derek Jack MD, have reviewed the above documentation for  accuracy and completeness, and I agree with the above.

## 2021-02-11 NOTE — Progress Notes (Signed)
Patient presents today for treatment and follow up visit with Dr. Delton Coombes. Vital signs within parameters for treatment. Labs within parameters for treatment.   Message received from [facility] / Dr. Delton Coombes to proceed with treatment.

## 2021-02-11 NOTE — Progress Notes (Signed)
Nutrition Follow-up:  Patient receiving CVP/rituximab for B cell lymphoma.   Met with patient during infusion. He reports good appetite, says he eats everything in sight. Patient reports sticking to a schedule, he eats meals at 7, noon, and 5 and drinks Ensure supplement at 10 and 3 everyday. Patient denies nutrition impact symptoms.    Medications: Megace, Klor-con, Eliquis, Zofran, Decadron, Emend  Labs: K 3.4, Glucose 176  Anthropometrics: Weight 143 lb 6.4 oz today increased 9 lbs in the last month  8/25 - 138 lb 3.7 oz 8/18 - 133 lb 11.2 oz 8/4 - 135 lb  7/21 - 155 lb 10.3 oz (?) 7/5 - 135 lb 6.4 oz   NUTRITION DIAGNOSIS: Unintentional weight loss stable   INTERVENTION:  Encouraged high calorie, high protein foods for weight maintenance  Continue drinking 2 Ensure Plus/equivalent daily - coupons provided    MONITORING, EVALUATION, GOAL: weight trends, intake   NEXT VISIT: Thursday October 6 during infusion

## 2021-02-11 NOTE — Patient Instructions (Signed)
[facility]  Discharge Instructions: Thank you for choosing [facility] to provide your oncology and hematology care.  If you have a lab appointment with the [facility], please come in thru the Main Entrance and check in at the main information desk.  Wear comfortable clothing and clothing appropriate for easy access to any Portacath or PICC line.   We strive to give you quality time with your provider. You may need to reschedule your appointment if you arrive late (15 or more minutes).  Arriving late affects you and other patients whose appointments are after yours.  Also, if you miss three or more appointments without notifying the office, you may be dismissed from the clinic at the provider's discretion.      For prescription refill requests, have your pharmacy contact our office and allow 72 hours for refills to be completed.    Today you received the following chemotherapy and/or immunotherapy agents R-CHOP.   To help prevent nausea and vomiting after your treatment, we encourage you to take your nausea medication as directed.  BELOW ARE SYMPTOMS THAT SHOULD BE REPORTED IMMEDIATELY: *FEVER GREATER THAN 100.4 F (38 C) OR HIGHER *CHILLS OR SWEATING *NAUSEA AND VOMITING THAT IS NOT CONTROLLED WITH YOUR NAUSEA MEDICATION *UNUSUAL SHORTNESS OF BREATH *UNUSUAL BRUISING OR BLEEDING *URINARY PROBLEMS (pain or burning when urinating, or frequent urination) *BOWEL PROBLEMS (unusual diarrhea, constipation, pain near the anus) TENDERNESS IN MOUTH AND THROAT WITH OR WITHOUT PRESENCE OF ULCERS (sore throat, sores in mouth, or a toothache) UNUSUAL RASH, SWELLING OR PAIN  UNUSUAL VAGINAL DISCHARGE OR ITCHING   Items with * indicate a potential emergency and should be followed up as soon as possible or go to the Emergency Department if any problems should occur.  Please show the CHEMOTHERAPY ALERT CARD or IMMUNOTHERAPY ALERT CARD at check-in to the Emergency Department  and triage nurse.  Should you have questions after your visit or need to cancel or reschedule your appointment, please contact [facility] [phone]  and follow the prompts.  Office hours are 8:00 a.m. to 4:30 p.m. Monday - Friday. Please note that voicemails left after 4:00 p.m. may not be returned until the following business day.  We are closed weekends and major holidays. You have access to a nurse at all times for urgent questions. Please call the main number to the clinic [phone] and follow the prompts.  For any non-urgent questions, you may also contact your provider using MyChart. We now offer e-Visits for anyone 29 and older to request care online for non-urgent symptoms. For details visit [email or web address].   Also download the MyChart app! Go to the app store, search "MyChart", open the app, select , and log in with your MyChart username and password.  Due to Covid, a mask is required upon entering the hospital/clinic. If you do not have a mask, one will be given to you upon arrival. For doctor visits, patients may have 1 support person aged 46 or older with them. For treatment visits, patients cannot have anyone with them due to current Covid guidelines and our immunocompromised population.

## 2021-02-12 ENCOUNTER — Inpatient Hospital Stay (HOSPITAL_COMMUNITY): Payer: Medicare Other

## 2021-02-12 VITALS — BP 112/47 | HR 92 | Temp 98.1°F | Resp 18

## 2021-02-12 DIAGNOSIS — C8590 Non-Hodgkin lymphoma, unspecified, unspecified site: Secondary | ICD-10-CM | POA: Diagnosis not present

## 2021-02-12 DIAGNOSIS — C8513 Unspecified B-cell lymphoma, intra-abdominal lymph nodes: Secondary | ICD-10-CM

## 2021-02-12 MED ORDER — PEGFILGRASTIM-BMEZ 6 MG/0.6ML ~~LOC~~ SOSY
6.0000 mg | PREFILLED_SYRINGE | Freq: Once | SUBCUTANEOUS | Status: AC
  Administered 2021-02-12: 6 mg via SUBCUTANEOUS
  Filled 2021-02-12: qty 0.6

## 2021-02-12 NOTE — Progress Notes (Signed)
.  Rafael Bihari presents today for injection per the provider's orders.  Ziextenzo administration without incident; injection site WNL; see MAR for injection details.  Patient tolerated procedure well and without incident.  No questions or complaints noted at this time.   Ziextenzo given today per MD orders. Tolerated infusion without adverse affects. Vital signs stable. No complaints at this time. Discharged from clinic via wheelchair in stable condition. Alert and oriented x 3. F/U with [facility] as scheduled.

## 2021-02-12 NOTE — Patient Instructions (Signed)
[facility]  Discharge Instructions: Thank you for choosing [facility] to provide your oncology and hematology care.  If you have a lab appointment with the [facility], please come in thru the Main Entrance and check in at the main information desk.  Wear comfortable clothing and clothing appropriate for easy access to any Portacath or PICC line.   We strive to give you quality time with your provider. You may need to reschedule your appointment if you arrive late (15 or more minutes).  Arriving late affects you and other patients whose appointments are after yours.  Also, if you miss three or more appointments without notifying the office, you may be dismissed from the clinic at the provider's discretion.      For prescription refill requests, have your pharmacy contact our office and allow 72 hours for refills to be completed.    Today you received Ziextenzo.     BELOW ARE SYMPTOMS THAT SHOULD BE REPORTED IMMEDIATELY: *FEVER GREATER THAN 100.4 F (38 C) OR HIGHER *CHILLS OR SWEATING *NAUSEA AND VOMITING THAT IS NOT CONTROLLED WITH YOUR NAUSEA MEDICATION *UNUSUAL SHORTNESS OF BREATH *UNUSUAL BRUISING OR BLEEDING *URINARY PROBLEMS (pain or burning when urinating, or frequent urination) *BOWEL PROBLEMS (unusual diarrhea, constipation, pain near the anus) TENDERNESS IN MOUTH AND THROAT WITH OR WITHOUT PRESENCE OF ULCERS (sore throat, sores in mouth, or a toothache) UNUSUAL RASH, SWELLING OR PAIN  UNUSUAL VAGINAL DISCHARGE OR ITCHING   Items with * indicate a potential emergency and should be followed up as soon as possible or go to the Emergency Department if any problems should occur.  Please show the CHEMOTHERAPY ALERT CARD or IMMUNOTHERAPY ALERT CARD at check-in to the Emergency Department and triage nurse.  Should you have questions after your visit or need to cancel or reschedule your appointment, please contact [facility] [phone]   and follow the prompts.  Office hours are 8:00 a.m. to 4:30 p.m. Monday - Friday. Please note that voicemails left after 4:00 p.m. may not be returned until the following business day.  We are closed weekends and major holidays. You have access to a nurse at all times for urgent questions. Please call the main number to the clinic [phone] and follow the prompts.  For any non-urgent questions, you may also contact your provider using MyChart. We now offer e-Visits for anyone 87 and older to request care online for non-urgent symptoms. For details visit [email or web address].   Also download the MyChart app! Go to the app store, search "MyChart", open the app, select , and log in with your MyChart username and password.  Due to Covid, a mask is required upon entering the hospital/clinic. If you do not have a mask, one will be given to you upon arrival. For doctor visits, patients may have 1 support person aged 12 or older with them. For treatment visits, patients cannot have anyone with them due to current Covid guidelines and our immunocompromised population.

## 2021-02-13 ENCOUNTER — Encounter: Payer: Self-pay | Admitting: Oncology

## 2021-02-18 ENCOUNTER — Inpatient Hospital Stay (HOSPITAL_COMMUNITY): Payer: Medicare Other

## 2021-02-18 ENCOUNTER — Other Ambulatory Visit: Payer: Self-pay

## 2021-02-18 DIAGNOSIS — C8590 Non-Hodgkin lymphoma, unspecified, unspecified site: Secondary | ICD-10-CM | POA: Diagnosis not present

## 2021-02-18 DIAGNOSIS — C8593 Non-Hodgkin lymphoma, unspecified, intra-abdominal lymph nodes: Secondary | ICD-10-CM

## 2021-02-18 LAB — CBC WITH DIFFERENTIAL/PLATELET
Abs Immature Granulocytes: 0.36 10*3/uL — ABNORMAL HIGH (ref 0.00–0.07)
Basophils Absolute: 0 10*3/uL (ref 0.0–0.1)
Basophils Relative: 1 %
Eosinophils Absolute: 0.2 10*3/uL (ref 0.0–0.5)
Eosinophils Relative: 5 %
HCT: 30.8 % — ABNORMAL LOW (ref 39.0–52.0)
Hemoglobin: 10.3 g/dL — ABNORMAL LOW (ref 13.0–17.0)
Immature Granulocytes: 8 %
Lymphocytes Relative: 9 %
Lymphs Abs: 0.4 10*3/uL — ABNORMAL LOW (ref 0.7–4.0)
MCH: 36.1 pg — ABNORMAL HIGH (ref 26.0–34.0)
MCHC: 33.4 g/dL (ref 30.0–36.0)
MCV: 108.1 fL — ABNORMAL HIGH (ref 80.0–100.0)
Monocytes Absolute: 0.8 10*3/uL (ref 0.1–1.0)
Monocytes Relative: 16 %
Neutro Abs: 2.9 10*3/uL (ref 1.7–7.7)
Neutrophils Relative %: 61 %
Platelets: 91 10*3/uL — ABNORMAL LOW (ref 150–400)
RBC: 2.85 MIL/uL — ABNORMAL LOW (ref 4.22–5.81)
RDW: 15.3 % (ref 11.5–15.5)
WBC: 4.7 10*3/uL (ref 4.0–10.5)
nRBC: 0 % (ref 0.0–0.2)

## 2021-02-18 LAB — MAGNESIUM: Magnesium: 2 mg/dL (ref 1.7–2.4)

## 2021-02-18 LAB — COMPREHENSIVE METABOLIC PANEL
ALT: 31 U/L (ref 0–44)
AST: 35 U/L (ref 15–41)
Albumin: 3.3 g/dL — ABNORMAL LOW (ref 3.5–5.0)
Alkaline Phosphatase: 99 U/L (ref 38–126)
Anion gap: 4 — ABNORMAL LOW (ref 5–15)
BUN: 13 mg/dL (ref 8–23)
CO2: 25 mmol/L (ref 22–32)
Calcium: 9.2 mg/dL (ref 8.9–10.3)
Chloride: 108 mmol/L (ref 98–111)
Creatinine, Ser: 0.56 mg/dL — ABNORMAL LOW (ref 0.61–1.24)
GFR, Estimated: >60 mL/min (ref >60)
Glucose, Bld: 137 mg/dL — ABNORMAL HIGH (ref 70–99)
Potassium: 3.5 mmol/L (ref 3.5–5.1)
Sodium: 137 mmol/L (ref 135–145)
Total Bilirubin: 1 mg/dL (ref 0.3–1.2)
Total Protein: 6.3 g/dL — ABNORMAL LOW (ref 6.5–8.1)

## 2021-02-18 MED ORDER — SODIUM CHLORIDE 0.9% FLUSH
10.0000 mL | Freq: Once | INTRAVENOUS | Status: AC
  Administered 2021-02-18: 10 mL via INTRAVENOUS

## 2021-02-18 MED ORDER — HEPARIN SOD (PORK) LOCK FLUSH 100 UNIT/ML IV SOLN
250.0000 [IU] | Freq: Once | INTRAVENOUS | Status: AC
  Administered 2021-02-18: 250 [IU] via INTRAVENOUS

## 2021-02-18 NOTE — Patient Instructions (Signed)
[facility]  Discharge Instructions: Thank you for choosing [facility] to provide your oncology and hematology care.  If you have a lab appointment with the [facility], please come in thru the Main Entrance and check in at the main information desk.  Wear comfortable clothing and clothing appropriate for easy access to any Portacath or PICC line.   We strive to give you quality time with your provider. You may need to reschedule your appointment if you arrive late (15 or more minutes).  Arriving late affects you and other patients whose appointments are after yours.  Also, if you miss three or more appointments without notifying the office, you may be dismissed from the clinic at the provider's discretion.      For prescription refill requests, have your pharmacy contact our office and allow 72 hours for refills to be completed.    Today your PICC line was flushed and dressing was changed, return as scheduled.   To help prevent nausea and vomiting after your treatment, we encourage you to take your nausea medication as directed.  BELOW ARE SYMPTOMS THAT SHOULD BE REPORTED IMMEDIATELY: *FEVER GREATER THAN 100.4 F (38 C) OR HIGHER *CHILLS OR SWEATING *NAUSEA AND VOMITING THAT IS NOT CONTROLLED WITH YOUR NAUSEA MEDICATION *UNUSUAL SHORTNESS OF BREATH *UNUSUAL BRUISING OR BLEEDING *URINARY PROBLEMS (pain or burning when urinating, or frequent urination) *BOWEL PROBLEMS (unusual diarrhea, constipation, pain near the anus) TENDERNESS IN MOUTH AND THROAT WITH OR WITHOUT PRESENCE OF ULCERS (sore throat, sores in mouth, or a toothache) UNUSUAL RASH, SWELLING OR PAIN  UNUSUAL VAGINAL DISCHARGE OR ITCHING   Items with * indicate a potential emergency and should be followed up as soon as possible or go to the Emergency Department if any problems should occur.  Please show the CHEMOTHERAPY ALERT CARD or IMMUNOTHERAPY ALERT CARD at check-in to the Emergency Department  and triage nurse.  Should you have questions after your visit or need to cancel or reschedule your appointment, please contact [facility] [phone]  and follow the prompts.  Office hours are 8:00 a.m. to 4:30 p.m. Monday - Friday. Please note that voicemails left after 4:00 p.m. may not be returned until the following business day.  We are closed weekends and major holidays. You have access to a nurse at all times for urgent questions. Please call the main number to the clinic [phone] and follow the prompts.  For any non-urgent questions, you may also contact your provider using MyChart. We now offer e-Visits for anyone 74 and older to request care online for non-urgent symptoms. For details visit [email or web address].   Also download the MyChart app! Go to the app store, search "MyChart", open the app, select [facility], and log in with your MyChart username and password.  Due to Covid, a mask is required upon entering the hospital/clinic. If you do not have a mask, one will be given to you upon arrival. For doctor visits, patients may have 1 support person aged 1 or older with them. For treatment visits, patients cannot have anyone with them due to current Covid guidelines and our immunocompromised population.

## 2021-02-25 ENCOUNTER — Encounter (HOSPITAL_COMMUNITY): Payer: Self-pay

## 2021-02-25 ENCOUNTER — Inpatient Hospital Stay (HOSPITAL_COMMUNITY): Payer: Medicare Other

## 2021-02-25 ENCOUNTER — Other Ambulatory Visit: Payer: Self-pay

## 2021-02-25 DIAGNOSIS — C8590 Non-Hodgkin lymphoma, unspecified, unspecified site: Secondary | ICD-10-CM | POA: Diagnosis not present

## 2021-02-25 DIAGNOSIS — C8593 Non-Hodgkin lymphoma, unspecified, intra-abdominal lymph nodes: Secondary | ICD-10-CM

## 2021-02-25 LAB — CBC WITH DIFFERENTIAL/PLATELET
Abs Immature Granulocytes: 0.12 10*3/uL — ABNORMAL HIGH (ref 0.00–0.07)
Basophils Absolute: 0.1 10*3/uL (ref 0.0–0.1)
Basophils Relative: 1 %
Eosinophils Absolute: 0.2 10*3/uL (ref 0.0–0.5)
Eosinophils Relative: 2 %
HCT: 33.5 % — ABNORMAL LOW (ref 39.0–52.0)
Hemoglobin: 11 g/dL — ABNORMAL LOW (ref 13.0–17.0)
Immature Granulocytes: 1 %
Lymphocytes Relative: 7 %
Lymphs Abs: 0.6 10*3/uL — ABNORMAL LOW (ref 0.7–4.0)
MCH: 35.6 pg — ABNORMAL HIGH (ref 26.0–34.0)
MCHC: 32.8 g/dL (ref 30.0–36.0)
MCV: 108.4 fL — ABNORMAL HIGH (ref 80.0–100.0)
Monocytes Absolute: 1 10*3/uL (ref 0.1–1.0)
Monocytes Relative: 11 %
Neutro Abs: 6.7 10*3/uL (ref 1.7–7.7)
Neutrophils Relative %: 78 %
Platelets: 145 10*3/uL — ABNORMAL LOW (ref 150–400)
RBC: 3.09 MIL/uL — ABNORMAL LOW (ref 4.22–5.81)
RDW: 15.4 % (ref 11.5–15.5)
WBC: 8.7 10*3/uL (ref 4.0–10.5)
nRBC: 0 % (ref 0.0–0.2)

## 2021-02-25 LAB — COMPREHENSIVE METABOLIC PANEL
ALT: 24 U/L (ref 0–44)
AST: 30 U/L (ref 15–41)
Albumin: 3.3 g/dL — ABNORMAL LOW (ref 3.5–5.0)
Alkaline Phosphatase: 88 U/L (ref 38–126)
Anion gap: 6 (ref 5–15)
BUN: 13 mg/dL (ref 8–23)
CO2: 24 mmol/L (ref 22–32)
Calcium: 9.3 mg/dL (ref 8.9–10.3)
Chloride: 109 mmol/L (ref 98–111)
Creatinine, Ser: 0.55 mg/dL — ABNORMAL LOW (ref 0.61–1.24)
GFR, Estimated: >60 mL/min (ref >60)
Glucose, Bld: 111 mg/dL — ABNORMAL HIGH (ref 70–99)
Potassium: 3.5 mmol/L (ref 3.5–5.1)
Sodium: 139 mmol/L (ref 135–145)
Total Bilirubin: 0.6 mg/dL (ref 0.3–1.2)
Total Protein: 6.2 g/dL — ABNORMAL LOW (ref 6.5–8.1)

## 2021-02-25 LAB — MAGNESIUM: Magnesium: 2.1 mg/dL (ref 1.7–2.4)

## 2021-02-25 MED ORDER — HEPARIN SOD (PORK) LOCK FLUSH 100 UNIT/ML IV SOLN
250.0000 [IU] | Freq: Once | INTRAVENOUS | Status: AC
  Administered 2021-02-25: 250 [IU] via INTRAVENOUS

## 2021-02-25 MED ORDER — SODIUM CHLORIDE 0.9% FLUSH
10.0000 mL | Freq: Once | INTRAVENOUS | Status: AC
  Administered 2021-02-25: 10 mL via INTRAVENOUS

## 2021-02-25 NOTE — Progress Notes (Signed)
Patient presents today for PICC line flush, lab draw and dressing change. PICC line flushed with good blood return noted in lumen #1 and #2.  No bruising or swelling at site.Dressing changed, and patient discharged in satisfactory condition via wheelchair with wife. VVS stable with no signs or symptoms of distressed noted.

## 2021-02-25 NOTE — Patient Instructions (Signed)
[facility]  Discharge Instructions: Thank you for choosing [facility] to provide your oncology and hematology care.  If you have a lab appointment with the [facility], please come in thru the Main Entrance and check in at the main information desk.  Wear comfortable clothing and clothing appropriate for easy access to any Portacath or PICC line.   We strive to give you quality time with your provider. You may need to reschedule your appointment if you arrive late (15 or more minutes).  Arriving late affects you and other patients whose appointments are after yours.  Also, if you miss three or more appointments without notifying the office, you may be dismissed from the clinic at the provider's discretion.      For prescription refill requests, have your pharmacy contact our office and allow 72 hours for refills to be completed.    Today your PICC line was flushed and dressing was changed. Return as scheduled.  To help prevent nausea and vomiting after your treatment, we encourage you to take your nausea medication as directed.  BELOW ARE SYMPTOMS THAT SHOULD BE REPORTED IMMEDIATELY: *FEVER GREATER THAN 100.4 F (38 C) OR HIGHER *CHILLS OR SWEATING *NAUSEA AND VOMITING THAT IS NOT CONTROLLED WITH YOUR NAUSEA MEDICATION *UNUSUAL SHORTNESS OF BREATH *UNUSUAL BRUISING OR BLEEDING *URINARY PROBLEMS (pain or burning when urinating, or frequent urination) *BOWEL PROBLEMS (unusual diarrhea, constipation, pain near the anus) TENDERNESS IN MOUTH AND THROAT WITH OR WITHOUT PRESENCE OF ULCERS (sore throat, sores in mouth, or a toothache) UNUSUAL RASH, SWELLING OR PAIN  UNUSUAL VAGINAL DISCHARGE OR ITCHING   Items with * indicate a potential emergency and should be followed up as soon as possible or go to the Emergency Department if any problems should occur.  Please show the CHEMOTHERAPY ALERT CARD or IMMUNOTHERAPY ALERT CARD at check-in to the Emergency Department and  triage nurse.  Should you have questions after your visit or need to cancel or reschedule your appointment, please contact [facility] [phone]  and follow the prompts.  Office hours are 8:00 a.m. to 4:30 p.m. Monday - Friday. Please note that voicemails left after 4:00 p.m. may not be returned until the following business day.  We are closed weekends and major holidays. You have access to a nurse at all times for urgent questions. Please call the main number to the clinic [phone] and follow the prompts.  For any non-urgent questions, you may also contact your provider using MyChart. We now offer e-Visits for anyone 76 and older to request care online for non-urgent symptoms. For details visit [email or web address].   Also download the MyChart app! Go to the app store, search "MyChart", open the app, select [facility], and log in with your MyChart username and password.  Due to Covid, a mask is required upon entering the hospital/clinic. If you do not have a mask, one will be given to you upon arrival. For doctor visits, patients may have 1 support person aged 5 or older with them. For treatment visits, patients cannot have anyone with them due to current Covid guidelines and our immunocompromised population.

## 2021-02-28 ENCOUNTER — Other Ambulatory Visit: Payer: Self-pay | Admitting: Oncology

## 2021-03-03 NOTE — Progress Notes (Signed)
[facility] [address], [place] [address]   CLINIC:  Medical Oncology/Hematology  PCP:  [name], MD [address] [place] [address] [phone]   REASON FOR VISIT:  Follow-up for Non-Hodgkin's lymphoma and history of rectal cancer  PRIOR THERAPY: none  NGS Results: not done  CURRENT THERAPY: CVP + Rituximab every 3 weeks  BRIEF ONCOLOGIC HISTORY:  Oncology History  Non-Hodgkin lymphoma ([place])  12/09/2020 Initial Diagnosis   Non-Hodgkin lymphoma ([place])   12/11/2020 -  Chemotherapy    Patient is on Treatment Plan: NON-HODGKINS LYMPHOMA CVP + RITUXIMAB Q21D         CANCER STAGING: Cancer Staging No matching staging information was found for the patient.  INTERVAL HISTORY:  [name], a 87 y.o. male, returns for routine follow-up and consideration for next cycle of chemotherapy. Cody Hurley was last seen on 02/11/2021.  Due for cycle #6 of CVP + Rituximab today.   Overall, he tells me he has been feeling pretty well. His energy levels have improved, and he reports occasional chills and leg swellings. He denies n/v/d, current bleeding, tingling/numbness, and SOB. He reports improved sleep and constant cold sensation in his hands. He reports continued improved appetite with Megace.   Overall, he feels ready for next cycle of chemo today.   REVIEW OF SYSTEMS:  Review of Systems  Constitutional:  Positive for chills. Negative for appetite change and fatigue (80%).  HENT:   Negative for nosebleeds.   Respiratory:  Negative for hemoptysis and shortness of breath.   Gastrointestinal:  Negative for blood in stool, diarrhea, nausea and vomiting.  Genitourinary:  Negative for hematuria.   Neurological:  Negative for numbness.  Psychiatric/Behavioral:  Negative for sleep disturbance.   All other systems reviewed and are negative.  PAST MEDICAL/SURGICAL HISTORY:  Past Medical History:  Diagnosis Date   Colorectal cancer ([place]) 2016    Prostate cancer [facility])    Past Surgical History:  Procedure Laterality Date   COLOSTOMY     MELANOMA EXCISION     ear   PROSTATE SURGERY  1991    SOCIAL HISTORY:  Social History   Socioeconomic History   Marital status: Married    Spouse name: Not on file   Number of children: Not on file   Years of education: Not on file   Highest education level: Not on file  Occupational History   Not on file  Tobacco Use   Smoking status: Former    Packs/day: 0.50    Types: Cigarettes    Quit date: 37    Years since quitting: 74.8   Smokeless tobacco: Never  Vaping Use   Vaping Use: Never used  Substance and Sexual Activity   Alcohol use: Never   Drug use: Never   Sexual activity: Not on file  Other Topics Concern   Not on file  Social History Narrative   Not on file   Social Determinants of Health   Financial Resource Strain: Low Risk    Difficulty of Paying Living Expenses: Not very hard  Food Insecurity: No Food Insecurity   Worried About Charity fundraiser in the Last Year: Never true   [place] in the Last Year: Never true  Transportation Needs: No Transportation Needs   Lack of Transportation (Medical): No   Lack of Transportation (Non-Medical): No  Physical Activity: Inactive   Days of Exercise per Week: 0 days   Minutes of Exercise per  Session: 0 min  Stress: No Stress Concern Present   Feeling of Stress : Not at all  Social Connections: Moderately Integrated   Frequency of Communication with Friends and Family: Three times a week   Frequency of Social Gatherings with Friends and Family: Three times a week   Attends Religious Services: 1 to 4 times per year   Active Member of Clubs or Organizations: No   Attends Archivist Meetings: Never   Marital Status: Married  Human resources officer Violence: Not At Risk   Fear of Current or Ex-Partner: No   Emotionally Abused: No   Physically Abused: No   Sexually Abused: No    FAMILY HISTORY:  No  family history on file.  CURRENT MEDICATIONS:  Current Outpatient Medications  Medication Sig Dispense Refill   acetaminophen (TYLENOL) 500 MG tablet Take 500-1,000 mg by mouth every 6 (six) hours as needed for mild pain (or headaches).     apixaban (ELIQUIS) 2.5 MG TABS tablet Take 1 tablet (2.5 mg total) by mouth 2 (two) times daily. 60 tablet 0   Cholecalciferol (VITAMIN D-3) 25 MCG (1000 UT) CAPS Take 2,000 Units by mouth daily with breakfast.     feeding supplement (ENSURE ENLIVE / ENSURE PLUS) LIQD Take 237 mLs by mouth 2 (two) times daily between meals. 237 mL 12   heparin lock flush 100 UNIT/ML SOLN injection 2.5 mLs (250 Units total) by Intracatheter route 2 (two) times a week. Inject 2.5 ml to each lumen of PICC line twice weekly after NS flush 120 mL 2   megestrol (MEGACE) 40 MG/ML suspension TAKE 10 MLS (400 MG TOTAL) BY MOUTH 2 (TWO) TIMES DAILY. 480 mL 2   Multiple Vitamin (MULTIVITAMIN) tablet Take 1 tablet by mouth daily.     nystatin (MYCOSTATIN) 100000 UNIT/ML suspension Take 5 mLs (500,000 Units total) by mouth 4 (four) times daily. 60 mL 0   oxybutynin (DITROPAN) 5 MG tablet Take 5 mg by mouth daily.     potassium chloride SA (KLOR-CON) 20 MEQ tablet Take 2 tabs daily for 3 days, then 1 tab daily 30 tablet 0   sodium chloride flush 0.9 % SOLN injection 10 mLs by Intracatheter route 2 (two) times a week. Inject into both PICC line ports twice weekly 480 mL 2   vitamin B-12 (CYANOCOBALAMIN) 100 MCG tablet Take 100 mcg by mouth daily.     zolpidem (AMBIEN) 5 MG tablet Take 1 tablet (5 mg total) by mouth at bedtime as needed for sleep. 30 tablet 0   No current facility-administered medications for this visit.    ALLERGIES:  Allergies  Allergen Reactions   Penicillin G Rash    PHYSICAL EXAM:  Performance status (ECOG): 2 - Symptomatic, <50% confined to bed  There were no vitals filed for this visit. Wt Readings from Last 3 Encounters:  02/11/21 143 lb 6.4 oz (65 kg)   01/21/21 138 lb 3.7 oz (62.7 kg)  01/14/21 133 lb 11.2 oz (60.6 kg)   Physical Exam Vitals reviewed.  Constitutional:      Appearance: Normal appearance.     Comments: In wheelchair  Cardiovascular:     Rate and Rhythm: Normal rate and regular rhythm.     Pulses: Normal pulses.     Heart sounds: Normal heart sounds.  Pulmonary:     Effort: Pulmonary effort is normal.     Breath sounds: Normal breath sounds.  Musculoskeletal:     Right lower leg: 1+ Edema present.  Left lower leg: 1+ Edema present.  Neurological:     General: No focal deficit present.     Mental Status: He is alert and oriented to person, place, and time.  Psychiatric:        Mood and Affect: Mood normal.        Behavior: Behavior normal.    LABORATORY DATA:  I have reviewed the labs as listed.  CBC Latest Ref Rng & Units 02/25/2021 02/18/2021 02/11/2021  WBC 4.0 - 10.5 K/uL 8.7 4.7 5.6  Hemoglobin 13.0 - 17.0 g/dL 11.0(L) 10.3(L) 11.8(L)  Hematocrit 39.0 - 52.0 % 33.5(L) 30.8(L) 35.7(L)  Platelets 150 - 400 K/uL 145(L) 91(L) 148(L)   CMP Latest Ref Rng & Units 02/25/2021 02/18/2021 02/11/2021  Glucose 70 - 99 mg/dL 111(H) 137(H) 176(H)  BUN 8 - 23 mg/dL 13 13 14   Creatinine 0.61 - 1.24 mg/dL 0.55(L) 0.56(L) 0.60(L)  Sodium 135 - 145 mmol/L 139 137 141  Potassium 3.5 - 5.1 mmol/L 3.5 3.5 3.4(L)  Chloride 98 - 111 mmol/L 109 108 111  CO2 22 - 32 mmol/L 24 25 25   Calcium 8.9 - 10.3 mg/dL 9.3 9.2 9.0  Total Protein 6.5 - 8.1 g/dL 6.2(L) 6.3(L) 6.1(L)  Total Bilirubin 0.3 - 1.2 mg/dL 0.6 1.0 0.7  Alkaline Phos 38 - 126 U/L 88 99 69  AST 15 - 41 U/L 30 35 30  ALT 0 - 44 U/L 24 31 25     DIAGNOSTIC IMAGING:  I have independently reviewed the scans and discussed with the patient. NM PET Image Initial (PI) Skull Base To Thigh (F-18 FDG)  Result Date: 02/05/2021 CLINICAL DATA:  Initial treatment strategy for non-Hodgkin's lymphoma. History of rectal cancer. EXAM: NUCLEAR MEDICINE PET SKULL BASE TO THIGH  TECHNIQUE: 7.6 mCi F-18 FDG was injected intravenously. Full-ring PET imaging was performed from the skull base to thigh after the radiotracer. CT data was obtained and used for attenuation correction and anatomic localization. Fasting blood glucose: 104 mg/dl COMPARISON:  Multiple exams, including CT scan 12/06/2020 FINDINGS: Mediastinal blood pool activity: SUV max 1.2 Liver activity: SUV max 1.6 NECK: No significant abnormal hypermetabolic activity in this region. Incidental CT findings: Bilateral common carotid atherosclerotic calcification. CHEST: No significant abnormal hypermetabolic activity in this region. Incidental CT findings: Right PICC line tip: Cavoatrial junction. Coronary, aortic arch, and branch vessel atherosclerotic vascular disease. Small type 1 hiatal hernia. Mild mosaic attenuation in the lungs. Mild scarring or atelectasis in the lingula. ABDOMEN/PELVIS: Marked reduction in the perinephric nodularity compatible with substantial response to therapy. Currently there is faint stranding and subtle infiltrative nodularity at the site of prior solid perirenal space tumor. Below the left kidney, the most confluent area of residual stranding has a maximum SUV of 1.0, Deauville 2. Marked improvement in the previous periaortic adenopathy. A residual left posterior periaortic lymph node measures 0.5 cm in short axis on image 153 series 3 (formerly 2.1 cm) and with maximum SUV in this vicinity of 1.4 (Deauville 3). The bilateral adrenal masses have essentially resolved (Deauville 1). No new regions of lymphoma are identified. Incidental CT findings: Cholelithiasis. Atherosclerosis is present, including aortoiliac atherosclerotic disease. Colonic diverticulosis. Left lower quadrant colostomy with peristomal hernia containing a loop of small bowel but without findings of strangulation or obstruction. Rectal pouch noted with therapy related presacral density which does not demonstrate hypermetabolic  activity. Cirrhosis. SKELETON: No significant abnormal hypermetabolic activity in this region. Incidental CT findings: Thoracic kyphosis. Grade 1 degenerative anterolisthesis of L4 on L5. Degenerative  disc disease and endplate sclerosis at J1-H4. IMPRESSION: 1. Marked improvement in the prior adrenal and right perirenal masses as well as the retroperitoneal adenopathy. The residual mild right perirenal infiltrative stranding is Deauville 2 and the 0.5 cm residual left periaortic lymph node is Deauville 3. The adrenal masses have resolved (Deauville 1). 2. Postoperative findings from low anterior resection with left lower quadrant ostomy and small peristomal hernia containing a loop of small bowel without findings of strangulation or obstruction. 3. Mild mosaic attenuation in the lungs is nonspecific but could be from low-level edema, air trapping, or extrinsic allergic alveolitis. 4. Other imaging findings of potential clinical significance: Aortic Atherosclerosis (ICD10-I70.0). Coronary atherosclerosis. Small type 1 hiatal hernia. Systemic atherosclerosis. Cholelithiasis. Colonic diverticulosis. Hepatic cirrhosis. Thoracic kyphosis. Electronically Signed   By: Van Clines M.D.   On: 02/05/2021 16:55     ASSESSMENT:  1.  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  Perirenal spaces with periaortic/retroperitoneal adenopathy, nodular thickening of the adrenal glands.  Ascites and edematous changes in the mesentery with cirrhotic features, portal enteropathy. - Presentation with 30 pound weight loss in the last 6 months. 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  Ki-67 95%.  Positive CD20, BCL6, CD5 and Bcl-2. - FISH positive for BCL6 and MYC rearrangement. - Cycle 1 of R-CVP on 12/11/2020 - Cycle 2 of R-CVP on 12/31/2020  2.  Stage III (ypT4, N2) rectal cancer: -  Presentation in November 2016 with mass at 10 cm from the anus, clinical stage II by EUS-T3N0 - Neoadjuvant Xeloda and radiation from 06/01/2015, completed 20/28 planned fractions, discontinued secondary to toxicity. - Low anterior resection in September 2017, ypT4a, N2  3.  Prostate cancer: - Treated with prostatectomy 1990.  4.  Social/family history: - He lives at home with his wife of 12 years.  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  No family history of malignancies.   PLAN:  1.  Double hit lymphoma: - He completed 4 cycles of chemotherapy. - PET scan after 3 cycles on 02/04/2021 with marked improvement in the prior adrenal and right perirenal masses as well as retroperitoneal adenopathy.  There is mild residual disease, Deauville 2 and left periaortic lymph node Deauville 3.  No new lesions. - He did not experience any major side effects after cycle 4.  Denies any tingling or numbness in extremities.  No PND or orthopnea. - Reviewed labs today which showed total bilirubin 1.4 and rest of LFTs are normal.  Uric acid was normal.  CBC was grossly normal.  LDH was 171. - Recommend proceeding with R mini CHOP cycle 5 today. - RTC 3 weeks for follow-up with labs and toxicity assessment and cycle 6.  2.  Weight loss: - Continue Megace twice daily.  He is eating well and gaining weight.  3.  Hypokalemia: - He ran out of potassium.  Potassium today is 3.3. - We will restart him potassium 20 mEq daily.  We repleted potassium in the clinic today.  4.  Atrial fibrillation: - Continue Eliquis.  No bleeding issues reported.  5.  Sleeping difficulty: - Continue Ambien 5 mg at bedtime which is helping partly.   Orders placed this encounter:  No orders of the defined types were placed in this encounter.    Derek Jack, MD [facility] [phone]   I, Thana Ates, am acting as a Education administrator  for Dr. Derek Jack.  I, Derek Jack MD, have reviewed the above  documentation for accuracy and completeness, and I agree with the above.

## 2021-03-04 ENCOUNTER — Inpatient Hospital Stay (HOSPITAL_COMMUNITY): Payer: Medicare Other

## 2021-03-04 ENCOUNTER — Inpatient Hospital Stay (HOSPITAL_COMMUNITY): Payer: Medicare Other | Admitting: Dietician

## 2021-03-04 ENCOUNTER — Inpatient Hospital Stay (HOSPITAL_BASED_OUTPATIENT_CLINIC_OR_DEPARTMENT_OTHER): Payer: Medicare Other | Admitting: Hematology

## 2021-03-04 ENCOUNTER — Inpatient Hospital Stay (HOSPITAL_COMMUNITY): Payer: Medicare Other | Attending: Hematology

## 2021-03-04 ENCOUNTER — Other Ambulatory Visit: Payer: Self-pay

## 2021-03-04 VITALS — BP 118/61 | HR 98 | Temp 96.7°F | Resp 18 | Wt 142.3 lb

## 2021-03-04 VITALS — BP 115/58 | HR 77 | Temp 97.6°F | Resp 16

## 2021-03-04 DIAGNOSIS — E876 Hypokalemia: Secondary | ICD-10-CM

## 2021-03-04 DIAGNOSIS — Z5112 Encounter for antineoplastic immunotherapy: Secondary | ICD-10-CM | POA: Diagnosis not present

## 2021-03-04 DIAGNOSIS — C8593 Non-Hodgkin lymphoma, unspecified, intra-abdominal lymph nodes: Secondary | ICD-10-CM | POA: Diagnosis not present

## 2021-03-04 DIAGNOSIS — R351 Nocturia: Secondary | ICD-10-CM | POA: Diagnosis not present

## 2021-03-04 DIAGNOSIS — Z5189 Encounter for other specified aftercare: Secondary | ICD-10-CM | POA: Diagnosis not present

## 2021-03-04 DIAGNOSIS — C8510 Unspecified B-cell lymphoma, unspecified site: Secondary | ICD-10-CM | POA: Insufficient documentation

## 2021-03-04 DIAGNOSIS — C8513 Unspecified B-cell lymphoma, intra-abdominal lymph nodes: Secondary | ICD-10-CM

## 2021-03-04 LAB — COMPREHENSIVE METABOLIC PANEL
ALT: 25 U/L (ref 0–44)
AST: 36 U/L (ref 15–41)
Albumin: 3.4 g/dL — ABNORMAL LOW (ref 3.5–5.0)
Alkaline Phosphatase: 70 U/L (ref 38–126)
Anion gap: 6 (ref 5–15)
BUN: 15 mg/dL (ref 8–23)
CO2: 26 mmol/L (ref 22–32)
Calcium: 9.6 mg/dL (ref 8.9–10.3)
Chloride: 110 mmol/L (ref 98–111)
Creatinine, Ser: 0.66 mg/dL (ref 0.61–1.24)
GFR, Estimated: >60 mL/min (ref >60)
Glucose, Bld: 162 mg/dL — ABNORMAL HIGH (ref 70–99)
Potassium: 3.3 mmol/L — ABNORMAL LOW (ref 3.5–5.1)
Sodium: 142 mmol/L (ref 135–145)
Total Bilirubin: 1.4 mg/dL — ABNORMAL HIGH (ref 0.3–1.2)
Total Protein: 6.3 g/dL — ABNORMAL LOW (ref 6.5–8.1)

## 2021-03-04 LAB — CBC WITH DIFFERENTIAL/PLATELET
Abs Immature Granulocytes: 0.02 10*3/uL (ref 0.00–0.07)
Basophils Absolute: 0.1 10*3/uL (ref 0.0–0.1)
Basophils Relative: 2 %
Eosinophils Absolute: 0.1 10*3/uL (ref 0.0–0.5)
Eosinophils Relative: 2 %
HCT: 34.9 % — ABNORMAL LOW (ref 39.0–52.0)
Hemoglobin: 11.5 g/dL — ABNORMAL LOW (ref 13.0–17.0)
Immature Granulocytes: 0 %
Lymphocytes Relative: 12 %
Lymphs Abs: 0.7 10*3/uL (ref 0.7–4.0)
MCH: 35 pg — ABNORMAL HIGH (ref 26.0–34.0)
MCHC: 33 g/dL (ref 30.0–36.0)
MCV: 106.1 fL — ABNORMAL HIGH (ref 80.0–100.0)
Monocytes Absolute: 1.1 10*3/uL — ABNORMAL HIGH (ref 0.1–1.0)
Monocytes Relative: 18 %
Neutro Abs: 3.9 10*3/uL (ref 1.7–7.7)
Neutrophils Relative %: 66 %
Platelets: 157 10*3/uL (ref 150–400)
RBC: 3.29 MIL/uL — ABNORMAL LOW (ref 4.22–5.81)
RDW: 15.2 % (ref 11.5–15.5)
WBC: 5.9 10*3/uL (ref 4.0–10.5)
nRBC: 0 % (ref 0.0–0.2)

## 2021-03-04 LAB — URIC ACID: Uric Acid, Serum: 4 mg/dL (ref 3.7–8.6)

## 2021-03-04 LAB — LACTATE DEHYDROGENASE: LDH: 171 U/L (ref 98–192)

## 2021-03-04 LAB — MAGNESIUM: Magnesium: 2.2 mg/dL (ref 1.7–2.4)

## 2021-03-04 MED ORDER — SODIUM CHLORIDE 0.9 % IV SOLN
375.0000 mg/m2 | Freq: Once | INTRAVENOUS | Status: AC
  Administered 2021-03-04: 600 mg via INTRAVENOUS
  Filled 2021-03-04: qty 50

## 2021-03-04 MED ORDER — SODIUM CHLORIDE 0.9 % IV SOLN
8.0000 mg | Freq: Once | INTRAVENOUS | Status: AC
  Administered 2021-03-04: 8 mg via INTRAVENOUS
  Filled 2021-03-04: qty 8

## 2021-03-04 MED ORDER — DIPHENHYDRAMINE HCL 25 MG PO CAPS
25.0000 mg | ORAL_CAPSULE | Freq: Once | ORAL | Status: AC
  Administered 2021-03-04: 25 mg via ORAL
  Filled 2021-03-04: qty 1

## 2021-03-04 MED ORDER — VINCRISTINE SULFATE CHEMO INJECTION 1 MG/ML
1.0000 mg | Freq: Once | INTRAVENOUS | Status: AC
  Administered 2021-03-04: 1 mg via INTRAVENOUS
  Filled 2021-03-04: qty 1

## 2021-03-04 MED ORDER — SODIUM CHLORIDE 0.9 % IV SOLN
600.0000 mg/m2 | Freq: Once | INTRAVENOUS | Status: AC
  Administered 2021-03-04: 1020 mg via INTRAVENOUS
  Filled 2021-03-04: qty 51

## 2021-03-04 MED ORDER — HEPARIN SOD (PORK) LOCK FLUSH 100 UNIT/ML IV SOLN
250.0000 [IU] | Freq: Once | INTRAVENOUS | Status: AC | PRN
  Administered 2021-03-04: 250 [IU]

## 2021-03-04 MED ORDER — DOXORUBICIN HCL CHEMO IV INJECTION 2 MG/ML
25.0000 mg/m2 | Freq: Once | INTRAVENOUS | Status: AC
  Administered 2021-03-04: 42 mg via INTRAVENOUS
  Filled 2021-03-04: qty 21

## 2021-03-04 MED ORDER — POTASSIUM CHLORIDE CRYS ER 20 MEQ PO TBCR
20.0000 meq | EXTENDED_RELEASE_TABLET | Freq: Once | ORAL | Status: AC
Start: 2021-03-04 — End: 2021-03-04
  Administered 2021-03-04: 20 meq via ORAL
  Filled 2021-03-04: qty 1

## 2021-03-04 MED ORDER — SODIUM CHLORIDE 0.9 % IV SOLN: Freq: Once | INTRAVENOUS | Status: AC

## 2021-03-04 MED ORDER — ACETAMINOPHEN 325 MG PO TABS
650.0000 mg | ORAL_TABLET | Freq: Once | ORAL | Status: AC
  Administered 2021-03-04: 650 mg via ORAL
  Filled 2021-03-04: qty 2

## 2021-03-04 MED ORDER — RITUXIMAB-PVVR CHEMO 500 MG/50ML IV SOLN
375.0000 mg/m2 | Freq: Once | INTRAVENOUS | Status: DC
  Filled 2021-03-04: qty 60

## 2021-03-04 MED ORDER — SODIUM CHLORIDE 0.9% FLUSH
10.0000 mL | INTRAVENOUS | Status: DC | PRN
Start: 2021-03-04 — End: 2021-03-04
  Administered 2021-03-04 (×3): 10 mL

## 2021-03-04 MED ORDER — SODIUM CHLORIDE 0.9 % IV SOLN
10.0000 mg | Freq: Once | INTRAVENOUS | Status: AC
  Administered 2021-03-04: 10 mg via INTRAVENOUS
  Filled 2021-03-04: qty 10

## 2021-03-04 MED ORDER — SODIUM CHLORIDE 0.9 % IV SOLN
150.0000 mg | Freq: Once | INTRAVENOUS | Status: AC
  Administered 2021-03-04: 150 mg via INTRAVENOUS
  Filled 2021-03-04: qty 150

## 2021-03-04 MED ORDER — POTASSIUM CHLORIDE CRYS ER 20 MEQ PO TBCR: EXTENDED_RELEASE_TABLET | ORAL | 1 refills | Status: DC

## 2021-03-04 NOTE — Progress Notes (Signed)
Nutrition Follow-up:  Patient receiving CVP/rituximab for B cell lymphoma.  Met with patient during infusion. Patient covered in blankets and resting this morning. Patient reports he is eating well at home, sticks to his schedule. He is drinking 2 Boost everyday and appreciative of coupons. Patient states he is not in a good mood today and not up for talking.    Medications: Megace, Klor-con, D3, B12, MVI  Labs: K 3.3, Glucose 162  Anthropometrics: 142 lb 4.8 oz today stable   9/15 - 143 lb 6.4 oz 8/25 - 138 lb 3.7 oz 8/18 - 133 lb 11.2 oz  NUTRITION DIAGNOSIS: Unintentional weight loss improved   INTERVENTION:  Continue drinking 2 Boost Plus daily for weight maintenance  Provided Boost coupons Patient has contact information    MONITORING, EVALUATION, GOAL: weight trends, intake   NEXT VISIT: To be scheduled with treatment as needed

## 2021-03-04 NOTE — Patient Instructions (Signed)
[facility] at [facility] Discharge Instructions  You were seen and examined today by Dr. Delton Coombes. You will received treatment today and your final treatment in 3 weeks. Return as scheduled for lab work, office visit and tx.     Thank you for choosing [facility] at [facility] to provide your oncology and hematology care.  To afford each patient quality time with our provider, please arrive at least 15 minutes before your scheduled appointment time.   If you have a lab appointment with the [facility] please come in thru the Main Entrance and check in at the main information desk.  You need to re-schedule your appointment should you arrive 10 or more minutes late.  We strive to give you quality time with our providers, and arriving late affects you and other patients whose appointments are after yours.  Also, if you no show three or more times for appointments you may be dismissed from the clinic at the providers discretion.     Again, thank you for choosing [facility].  Our hope is that these requests will decrease the amount of time that you wait before being seen by our physicians.       _____________________________________________________________  Should you have questions after your visit to [facility], please contact our office at [phone] and follow the prompts.  Our office hours are 8:00 a.m. and 4:30 p.m. Monday - Friday.  Please note that voicemails left after 4:00 p.m. may not be returned until the following business day.  We are closed weekends and major holidays.  You do have access to a nurse 24-7, just call the main number to the clinic [phone] and do not press any options, hold on the line and a nurse will answer the phone.    For prescription refill requests, have your pharmacy contact our office and allow 72 hours.    Due to Covid, you will need to wear a mask upon entering the hospital.  If you do not have a mask, a mask will be given to you at the Main Entrance upon arrival. For doctor visits, patients may have 1 support person age 82 or older with them. For treatment visits, patients can not have anyone with them due to social distancing guidelines and our immunocompromised population.

## 2021-03-04 NOTE — Patient Instructions (Signed)
[facility]  Discharge Instructions: Thank you for choosing [facility] to provide your oncology and hematology care.  If you have a lab appointment with the [facility], please come in thru the Main Entrance and check in at the main information desk.  Wear comfortable clothing and clothing appropriate for easy access to any Portacath or PICC line.   We strive to give you quality time with your provider. You may need to reschedule your appointment if you arrive late (15 or more minutes).  Arriving late affects you and other patients whose appointments are after yours.  Also, if you miss three or more appointments without notifying the office, you may be dismissed from the clinic at the provider's discretion.      For prescription refill requests, have your pharmacy contact our office and allow 72 hours for refills to be completed.    Today you received the following chemotherapy and/or immunotherapy agents ; Rituximab, Doxorubicin, Vincristine, and Cytoxan. Return as scheduled.   To help prevent nausea and vomiting after your treatment, we encourage you to take your nausea medication as directed.  BELOW ARE SYMPTOMS THAT SHOULD BE REPORTED IMMEDIATELY: *FEVER GREATER THAN 100.4 F (38 C) OR HIGHER *CHILLS OR SWEATING *NAUSEA AND VOMITING THAT IS NOT CONTROLLED WITH YOUR NAUSEA MEDICATION *UNUSUAL SHORTNESS OF BREATH *UNUSUAL BRUISING OR BLEEDING *URINARY PROBLEMS (pain or burning when urinating, or frequent urination) *BOWEL PROBLEMS (unusual diarrhea, constipation, pain near the anus) TENDERNESS IN MOUTH AND THROAT WITH OR WITHOUT PRESENCE OF ULCERS (sore throat, sores in mouth, or a toothache) UNUSUAL RASH, SWELLING OR PAIN  UNUSUAL VAGINAL DISCHARGE OR ITCHING   Items with * indicate a potential emergency and should be followed up as soon as possible or go to the Emergency Department if any problems should occur.  Please show the CHEMOTHERAPY ALERT CARD or  IMMUNOTHERAPY ALERT CARD at check-in to the Emergency Department and triage nurse.  Should you have questions after your visit or need to cancel or reschedule your appointment, please contact [facility] [phone]  and follow the prompts.  Office hours are 8:00 a.m. to 4:30 p.m. Monday - Friday. Please note that voicemails left after 4:00 p.m. may not be returned until the following business day.  We are closed weekends and major holidays. You have access to a nurse at all times for urgent questions. Please call the main number to the clinic [phone] and follow the prompts.  For any non-urgent questions, you may also contact your provider using MyChart. We now offer e-Visits for anyone 36 and older to request care online for non-urgent symptoms. For details visit [email or web address].   Also download the MyChart app! Go to the app store, search "MyChart", open the app, select [facility], and log in with your MyChart username and password.  Due to Covid, a mask is required upon entering the hospital/clinic. If you do not have a mask, one will be given to you upon arrival. For doctor visits, patients may have 1 support person aged 32 or older with them. For treatment visits, patients cannot have anyone with them due to current Covid guidelines and our immunocompromised population.

## 2021-03-04 NOTE — Progress Notes (Signed)
Patient tolerated RCHOP with no complaints voiced. Side effects with management reviewed understanding verbalized. PICC site clean and dry with no bruising or swelling noted at site. Good blood return noted before and after administration of chemotherapy. PICC line dressing flushed and dressing changed. Patient left in satisfactory condition via wheelchair with VSS and no s/s of distress noted.

## 2021-03-04 NOTE — Progress Notes (Signed)
Patient has been examined, vital signs and labs have been reviewed by Dr. Katragadda. ANC, Creatinine, LFTs, hemoglobin, and platelets are within treatment parameters per Dr. Katragadda. Patient may proceed with treatment per M.D.   

## 2021-03-05 ENCOUNTER — Encounter (HOSPITAL_COMMUNITY): Payer: Self-pay

## 2021-03-05 ENCOUNTER — Inpatient Hospital Stay (HOSPITAL_COMMUNITY): Payer: Medicare Other

## 2021-03-05 VITALS — BP 112/58 | HR 86 | Temp 98.0°F | Resp 19

## 2021-03-05 DIAGNOSIS — Z5112 Encounter for antineoplastic immunotherapy: Secondary | ICD-10-CM | POA: Diagnosis not present

## 2021-03-05 DIAGNOSIS — C8513 Unspecified B-cell lymphoma, intra-abdominal lymph nodes: Secondary | ICD-10-CM

## 2021-03-05 MED ORDER — PEGFILGRASTIM-BMEZ 6 MG/0.6ML ~~LOC~~ SOSY
6.0000 mg | PREFILLED_SYRINGE | Freq: Once | SUBCUTANEOUS | Status: AC
  Administered 2021-03-05: 6 mg via SUBCUTANEOUS
  Filled 2021-03-05: qty 0.6

## 2021-03-05 NOTE — Patient Instructions (Signed)
[facility]  Discharge Instructions: Thank you for choosing [facility] to provide your oncology and hematology care.  If you have a lab appointment with the Cancer Center, please come in thru the Main Entrance and check in at the main information desk.  Wear comfortable clothing and clothing appropriate for easy access to any Portacath or PICC line.   We strive to give you quality time with your provider. You may need to reschedule your appointment if you arrive late (15 or more minutes).  Arriving late affects you and other patients whose appointments are after yours.  Also, if you miss three or more appointments without notifying the office, you may be dismissed from the clinic at the provider's discretion.      For prescription refill requests, have your pharmacy contact our office and allow 72 hours for refills to be completed.    Today you received the following chemotherapy and/or immunotherapy agents Ziextenzo. Return as scheduled. To help prevent nausea and vomiting after your treatment, we encourage you to take your nausea medication as directed.  BELOW ARE SYMPTOMS THAT SHOULD BE REPORTED IMMEDIATELY: *FEVER GREATER THAN 100.4 F (38 C) OR HIGHER *CHILLS OR SWEATING *NAUSEA AND VOMITING THAT IS NOT CONTROLLED WITH YOUR NAUSEA MEDICATION *UNUSUAL SHORTNESS OF BREATH *UNUSUAL BRUISING OR BLEEDING *URINARY PROBLEMS (pain or burning when urinating, or frequent urination) *BOWEL PROBLEMS (unusual diarrhea, constipation, pain near the anus) TENDERNESS IN MOUTH AND THROAT WITH OR WITHOUT PRESENCE OF ULCERS (sore throat, sores in mouth, or a toothache) UNUSUAL RASH, SWELLING OR PAIN  UNUSUAL VAGINAL DISCHARGE OR ITCHING   Items with * indicate a potential emergency and should be followed up as soon as possible or go to the Emergency Department if any problems should occur.  Please show the CHEMOTHERAPY ALERT CARD or IMMUNOTHERAPY ALERT CARD at check-in to the  Emergency Department and triage nurse.  Should you have questions after your visit or need to cancel or reschedule your appointment, please contact  CANC[facility] CENTER [phone]  and follow the prompts.  Office hours are 8:00 a.m. to 4:30 p.m. Monday - Friday. Please note that voicemails left after 4:00 p.m. may not be returned until the following business day.  We are closed weekends and major holidays. You have access to a nurse at all times for urgent questions. Please call the main number to the clinic [phone] and follow the prompts.  For any non-urgent questions, you may also contact your provider using MyChart. We now offer e-Visits for anyone 18 and older to request care online for non-urgent symptoms. For details visit [email or web address].   Also download the MyChart app! Go to the app store, search "MyChart", open the app, select [facility], and log in with your MyChart username and password.  Due to Covid, a mask is required upon entering the hospital/clinic. If you do not have a mask, one will be given to you upon arrival. For doctor visits, patients may have 1 support person aged 18 or older with them. For treatment visits, patients cannot have anyone with them due to current Covid guidelines and our immunocompromised population.  

## 2021-03-05 NOTE — Progress Notes (Signed)
Patient tolerated Ziextenzo injection with no complaints voiced. Site clean and dry with no bruising or swelling noted at site. See MAR for details. Band aid applied.  Patient stable during and after injection. VSS with discharge and left in satisfactory condition with no s/s of distress noted.  

## 2021-03-08 ENCOUNTER — Other Ambulatory Visit (HOSPITAL_COMMUNITY): Payer: Self-pay | Admitting: Hematology

## 2021-03-12 ENCOUNTER — Encounter (HOSPITAL_COMMUNITY): Payer: Self-pay

## 2021-03-12 ENCOUNTER — Ambulatory Visit (HOSPITAL_COMMUNITY)
Admission: RE | Admit: 2021-03-12 | Discharge: 2021-03-12 | Disposition: A | Discharge status: Home / Self Care | Payer: Medicare Other | Source: Ambulatory Visit | Attending: Physician Assistant | Admitting: Physician Assistant

## 2021-03-12 ENCOUNTER — Inpatient Hospital Stay (HOSPITAL_COMMUNITY): Payer: Medicare Other

## 2021-03-12 ENCOUNTER — Ambulatory Visit (HOSPITAL_COMMUNITY): Payer: Medicare Other

## 2021-03-12 ENCOUNTER — Other Ambulatory Visit (HOSPITAL_COMMUNITY): Payer: Self-pay | Admitting: Physician Assistant

## 2021-03-12 ENCOUNTER — Other Ambulatory Visit: Payer: Self-pay

## 2021-03-12 DIAGNOSIS — R071 Chest pain on breathing: Secondary | ICD-10-CM | POA: Insufficient documentation

## 2021-03-12 DIAGNOSIS — C8593 Non-Hodgkin lymphoma, unspecified, intra-abdominal lymph nodes: Secondary | ICD-10-CM

## 2021-03-12 DIAGNOSIS — Z5112 Encounter for antineoplastic immunotherapy: Secondary | ICD-10-CM | POA: Diagnosis not present

## 2021-03-12 LAB — CBC WITH DIFFERENTIAL/PLATELET
Band Neutrophils: 13 %
Basophils Absolute: 0 10*3/uL (ref 0.0–0.1)
Basophils Relative: 0 %
Eosinophils Absolute: 0 10*3/uL (ref 0.0–0.5)
Eosinophils Relative: 1 %
HCT: 32.2 % — ABNORMAL LOW (ref 39.0–52.0)
Hemoglobin: 10.7 g/dL — ABNORMAL LOW (ref 13.0–17.0)
Lymphocytes Relative: 17 %
Lymphs Abs: 0.8 10*3/uL (ref 0.7–4.0)
MCH: 35.4 pg — ABNORMAL HIGH (ref 26.0–34.0)
MCHC: 33.2 g/dL (ref 30.0–36.0)
MCV: 106.6 fL — ABNORMAL HIGH (ref 80.0–100.0)
Metamyelocytes Relative: 2 %
Monocytes Absolute: 0.7 10*3/uL (ref 0.1–1.0)
Monocytes Relative: 14 %
Neutro Abs: 3.1 10*3/uL (ref 1.7–7.7)
Neutrophils Relative %: 53 %
Platelets: 73 10*3/uL — ABNORMAL LOW (ref 150–400)
RBC: 3.02 MIL/uL — ABNORMAL LOW (ref 4.22–5.81)
RDW: 15.2 % (ref 11.5–15.5)
WBC: 4.7 10*3/uL (ref 4.0–10.5)
nRBC: 0 % (ref 0.0–0.2)

## 2021-03-12 LAB — COMPREHENSIVE METABOLIC PANEL
ALT: 32 U/L (ref 0–44)
AST: 37 U/L (ref 15–41)
Albumin: 3.4 g/dL — ABNORMAL LOW (ref 3.5–5.0)
Alkaline Phosphatase: 113 U/L (ref 38–126)
Anion gap: 6 (ref 5–15)
BUN: 12 mg/dL (ref 8–23)
CO2: 24 mmol/L (ref 22–32)
Calcium: 9.3 mg/dL (ref 8.9–10.3)
Chloride: 109 mmol/L (ref 98–111)
Creatinine, Ser: 0.6 mg/dL — ABNORMAL LOW (ref 0.61–1.24)
GFR, Estimated: >60 mL/min (ref >60)
Glucose, Bld: 130 mg/dL — ABNORMAL HIGH (ref 70–99)
Potassium: 3.4 mmol/L — ABNORMAL LOW (ref 3.5–5.1)
Sodium: 139 mmol/L (ref 135–145)
Total Bilirubin: 0.9 mg/dL (ref 0.3–1.2)
Total Protein: 6.5 g/dL (ref 6.5–8.1)

## 2021-03-12 LAB — MAGNESIUM: Magnesium: 2.1 mg/dL (ref 1.7–2.4)

## 2021-03-12 MED ORDER — SODIUM CHLORIDE 0.9% FLUSH
10.0000 mL | INTRAVENOUS | Status: DC | PRN
  Administered 2021-03-12: 10 mL via INTRAVENOUS

## 2021-03-12 MED ORDER — HEPARIN SOD (PORK) LOCK FLUSH 100 UNIT/ML IV SOLN
500.0000 [IU] | Freq: Once | INTRAVENOUS | Status: AC
  Administered 2021-03-12: 250 [IU] via INTRAVENOUS

## 2021-03-12 NOTE — Progress Notes (Signed)
Patient had a fall this week, had some questions about meds and just not feeling well this past week . PA notified to come by and see Pt. Labs drawn per orders.     PICC line dressing change done, labs done today. Flushed both lumens per protocol without any difficulty. See flowsheet.   Rafael Bihari presented for PICC line flush. Proper placement of PICC confirmed by CXR. PICC line located right arm Good blood return present. PICC line flushed with 55ml NS and 300U/47ml Heparin. Procedure without incident. Patient tolerated procedure well.    Patient tolerated it well without problems. Vitals stable and discharged home from clinic via wheelchair. Follow up as scheduled.

## 2021-03-12 NOTE — Progress Notes (Signed)
Brief patient visit performed today, per RN request, while patient was in clinic for labs.  Patient had chemotherapy on Friday 03/05/2021, and was feeling particularly weak the day after chemotherapy.  He felt like his legs did not have strength to walk.  Patient sustained a fall in his front yard and has reported some subsequent left-sided rib pain that is made worse by breathing.  Patient reports that he does not want any pain medication and that the pain is tolerable.  Left lateral ribs are tender to palpation.  No cardiopulmonary abnormalities on exam.  Caregiver who is present with patient reports that the patient has seemed more discouraged this week, and that has been "harder than the others."  He has been more tired, and has said once or twice that "maybe he is ready to die if Jesus is ready to take him away."  This has the caregiver concerned.  Patient does report that he feels discouraged, but he remains optimistic about continuing treatment.  He denies any suicidal ideation or intent.  PLAN SUMMARY: - Obtain x-ray of left-sided ribs (we will call patient with results) - Patient has declined pain medication - Chaplain visit performed during visit today due to patient's feelings of discouragement

## 2021-03-16 NOTE — Progress Notes (Signed)
NURSE POOL: Please call patient and/or caregiver to let them know that x-ray did not show any rib fractures.  Patient's musculoskeletal left-sided chest pain is likely due to bruising or a mild pulled muscle.  No cause for concern at this time, but he should reach out if he has any new or worsening symptoms.

## 2021-03-16 NOTE — Progress Notes (Signed)
Wife notified and verbalized understanding.  

## 2021-03-19 ENCOUNTER — Other Ambulatory Visit: Payer: Self-pay

## 2021-03-19 ENCOUNTER — Inpatient Hospital Stay (HOSPITAL_COMMUNITY): Payer: Medicare Other

## 2021-03-19 DIAGNOSIS — Z5112 Encounter for antineoplastic immunotherapy: Secondary | ICD-10-CM | POA: Diagnosis not present

## 2021-03-19 DIAGNOSIS — C8593 Non-Hodgkin lymphoma, unspecified, intra-abdominal lymph nodes: Secondary | ICD-10-CM

## 2021-03-19 LAB — CBC WITH DIFFERENTIAL/PLATELET
Abs Immature Granulocytes: 0.07 10*3/uL (ref 0.00–0.07)
Basophils Absolute: 0.1 10*3/uL (ref 0.0–0.1)
Basophils Relative: 1 %
Eosinophils Absolute: 0.3 10*3/uL (ref 0.0–0.5)
Eosinophils Relative: 4 %
HCT: 34.6 % — ABNORMAL LOW (ref 39.0–52.0)
Hemoglobin: 11.2 g/dL — ABNORMAL LOW (ref 13.0–17.0)
Immature Granulocytes: 1 %
Lymphocytes Relative: 7 %
Lymphs Abs: 0.5 10*3/uL — ABNORMAL LOW (ref 0.7–4.0)
MCH: 34.4 pg — ABNORMAL HIGH (ref 26.0–34.0)
MCHC: 32.4 g/dL (ref 30.0–36.0)
MCV: 106.1 fL — ABNORMAL HIGH (ref 80.0–100.0)
Monocytes Absolute: 1.1 10*3/uL — ABNORMAL HIGH (ref 0.1–1.0)
Monocytes Relative: 15 %
Neutro Abs: 5.3 10*3/uL (ref 1.7–7.7)
Neutrophils Relative %: 72 %
Platelets: 178 10*3/uL (ref 150–400)
RBC: 3.26 MIL/uL — ABNORMAL LOW (ref 4.22–5.81)
RDW: 14.9 % (ref 11.5–15.5)
WBC: 7.4 10*3/uL (ref 4.0–10.5)
nRBC: 0 % (ref 0.0–0.2)

## 2021-03-19 LAB — COMPREHENSIVE METABOLIC PANEL
ALT: 25 U/L (ref 0–44)
AST: 31 U/L (ref 15–41)
Albumin: 3.4 g/dL — ABNORMAL LOW (ref 3.5–5.0)
Alkaline Phosphatase: 99 U/L (ref 38–126)
Anion gap: 5 (ref 5–15)
BUN: 16 mg/dL (ref 8–23)
CO2: 24 mmol/L (ref 22–32)
Calcium: 9.6 mg/dL (ref 8.9–10.3)
Chloride: 110 mmol/L (ref 98–111)
Creatinine, Ser: 0.55 mg/dL — ABNORMAL LOW (ref 0.61–1.24)
GFR, Estimated: >60 mL/min (ref >60)
Glucose, Bld: 118 mg/dL — ABNORMAL HIGH (ref 70–99)
Potassium: 3.6 mmol/L (ref 3.5–5.1)
Sodium: 139 mmol/L (ref 135–145)
Total Bilirubin: 0.8 mg/dL (ref 0.3–1.2)
Total Protein: 6.5 g/dL (ref 6.5–8.1)

## 2021-03-19 LAB — MAGNESIUM: Magnesium: 2.2 mg/dL (ref 1.7–2.4)

## 2021-03-19 MED ORDER — SODIUM CHLORIDE 0.9% FLUSH
10.0000 mL | INTRAVENOUS | Status: DC | PRN
  Administered 2021-03-19: 10 mL via INTRAVENOUS

## 2021-03-19 MED ORDER — SODIUM CHLORIDE 0.9% FLUSH
10.0000 mL | INTRAVENOUS | Status: DC | PRN
Start: 2021-03-19 — End: 2021-03-19
  Administered 2021-03-19: 10 mL via INTRAVENOUS

## 2021-03-19 MED ORDER — HEPARIN SOD (PORK) LOCK FLUSH 100 UNIT/ML IV SOLN
250.0000 [IU] | Freq: Once | INTRAVENOUS | Status: AC
  Administered 2021-03-19: 250 [IU] via INTRAVENOUS

## 2021-03-19 NOTE — Patient Instructions (Signed)
[facility]  Discharge Instructions: Thank you for choosing [facility] to provide your oncology and hematology care.  If you have a lab appointment with the [facility], please come in thru the Main Entrance and check in at the main information desk.  Wear comfortable clothing and clothing appropriate for easy access to any Portacath or PICC line.   We strive to give you quality time with your provider. You may need to reschedule your appointment if you arrive late (15 or more minutes).  Arriving late affects you and other patients whose appointments are after yours.  Also, if you miss three or more appointments without notifying the office, you may be dismissed from the clinic at the provider's discretion.      For prescription refill requests, have your pharmacy contact our office and allow 72 hours for refills to be completed.    Today you received PICC line dressing change with labs     BELOW ARE SYMPTOMS THAT SHOULD BE REPORTED IMMEDIATELY: *FEVER GREATER THAN 100.4 F (38 C) OR HIGHER *CHILLS OR SWEATING *NAUSEA AND VOMITING THAT IS NOT CONTROLLED WITH YOUR NAUSEA MEDICATION *UNUSUAL SHORTNESS OF BREATH *UNUSUAL BRUISING OR BLEEDING *URINARY PROBLEMS (pain or burning when urinating, or frequent urination) *BOWEL PROBLEMS (unusual diarrhea, constipation, pain near the anus) TENDERNESS IN MOUTH AND THROAT WITH OR WITHOUT PRESENCE OF ULCERS (sore throat, sores in mouth, or a toothache) UNUSUAL RASH, SWELLING OR PAIN  UNUSUAL VAGINAL DISCHARGE OR ITCHING   Items with * indicate a potential emergency and should be followed up as soon as possible or go to the Emergency Department if any problems should occur.  Please show the CHEMOTHERAPY ALERT CARD or IMMUNOTHERAPY ALERT CARD at check-in to the Emergency Department and triage nurse.  Should you have questions after your visit or need to cancel or reschedule your appointment, please contact [facility] [phone]  and follow the prompts.  Office hours are 8:00 a.m. to 4:30 p.m. Monday - Friday. Please note that voicemails left after 4:00 p.m. may not be returned until the following business day.  We are closed weekends and major holidays. You have access to a nurse at all times for urgent questions. Please call the main number to the clinic [phone] and follow the prompts.  For any non-urgent questions, you may also contact your provider using MyChart. We now offer e-Visits for anyone 15 and older to request care online for non-urgent symptoms. For details visit [email or web address].   Also download the MyChart app! Go to the app store, search "MyChart", open the app, select , and log in with your MyChart username and password.  Due to Covid, a mask is required upon entering the hospital/clinic. If you do not have a mask, one will be given to you upon arrival. For doctor visits, patients may have 1 support person aged 83 or older with them. For treatment visits, patients cannot have anyone with them due to current Covid guidelines and our immunocompromised population.

## 2021-03-19 NOTE — Progress Notes (Signed)
Pt presents today for PICC line dressing change and labs per provider's order.Vital signs stable and pt voiced no new complaints at this time. Both lumens flushed easily without difficulty with 10 ml of NS and 2.5 ml of heparin each.  PICC line dressing change and labs given today per MD orders. Tolerated infusion without adverse affects. Vital signs stable. No complaints at this time. Discharged from clinic ambulatory in stable condition. Alert and oriented x 3. F/U with [facility] as scheduled.

## 2021-03-24 NOTE — Progress Notes (Signed)
Cody Hurley [address] [place] [address]   CLINIC:  Medical Oncology/Hematology  PCP:  [name], MD [address] [place] [address] [phone]   REASON FOR VISIT:  Follow-up for Non-Hodgkin's lymphoma and history of rectal cancer  PRIOR THERAPY: none  NGS Results: not done  CURRENT THERAPY: CVP + Rituximab every 3 weeks  BRIEF ONCOLOGIC HISTORY:  Oncology History  Non-Hodgkin lymphoma ([facility])  12/09/2020 Initial Diagnosis   Non-Hodgkin lymphoma ([place])   12/11/2020 -  Chemotherapy   Patient is on Treatment Plan : NON-HODGKINS LYMPHOMA CVP + Rituximab q21d       CANCER STAGING: Cancer Staging No matching staging information was found for the patient.  INTERVAL HISTORY:  [name], a 87 y.o. male, returns for routine follow-up and consideration for next cycle of chemotherapy. Cody Hurley was last seen on 03/04/2021.  Due for cycle #7 of CVP + Rituximab today.   Overall, he tells me he has been feeling pretty well. He reports frequent urination and nocturia, but he denies dysuria. His energy levels have been decreasing and anxiety had been increasing over the past 3 weeks. His appetite is good.   Overall, he feels ready for next cycle of chemo today.   REVIEW OF SYSTEMS:  Review of Systems  Constitutional:  Positive for fatigue (25%). Negative for appetite change.  Respiratory:  Positive for cough.   Cardiovascular:  Positive for chest pain.  Genitourinary:  Positive for frequency and nocturia. Negative for dysuria.   Neurological:  Positive for numbness.  Psychiatric/Behavioral:  Positive for sleep disturbance.   All other systems reviewed and are negative.  PAST MEDICAL/SURGICAL HISTORY:  Past Medical History:  Diagnosis Date   Colorectal cancer ([place]) 2016   Prostate cancer [facility])    Past Surgical History:  Procedure Laterality Date   COLOSTOMY     MELANOMA EXCISION     ear   PROSTATE SURGERY  1991    SOCIAL  HISTORY:  Social History   Socioeconomic History   Marital status: Married    Spouse name: Not on file   Number of children: Not on file   Years of education: Not on file   Highest education level: Not on file  Occupational History   Not on file  Tobacco Use   Smoking status: Former    Packs/day: 0.50    Types: Cigarettes    Quit date: 64    Years since quitting: 74.8   Smokeless tobacco: Never  Vaping Use   Vaping Use: Never used  Substance and Sexual Activity   Alcohol use: Never   Drug use: Never   Sexual activity: Not on file  Other Topics Concern   Not on file  Social History Narrative   Not on file   Social Determinants of Health   Financial Resource Strain: Low Risk    Difficulty of Paying Living Expenses: Not very hard  Food Insecurity: No Food Insecurity   Worried About Charity fundraiser in the Last Year: Never true   [place] in the Last Year: Never true  Transportation Needs: No Transportation Needs   Lack of Transportation (Medical): No   Lack of Transportation (Non-Medical): No  Physical Activity: Inactive   Days of Exercise per Week: 0 days   Minutes of Exercise per Session: 0 min  Stress: No Stress Concern Present   Feeling of Stress : Not at all  Social Connections: Moderately Integrated  Frequency of Communication with Friends and Family: Three times a week   Frequency of Social Gatherings with Friends and Family: Three times a week   Attends Religious Services: 1 to 4 times per year   Active Member of Clubs or Organizations: No   Attends Archivist Meetings: Never   Marital Status: Married  Human resources officer Violence: Not At Risk   Fear of Current or Ex-Partner: No   Emotionally Abused: No   Physically Abused: No   Sexually Abused: No    FAMILY HISTORY:  No family history on file.  CURRENT MEDICATIONS:  Current Outpatient Medications  Medication Sig Dispense Refill   acetaminophen (TYLENOL) 500 MG tablet Take  500-1,000 mg by mouth every 6 (six) hours as needed for mild pain (or headaches).     Cholecalciferol (VITAMIN D-3) 25 MCG (1000 UT) CAPS Take 2,000 Units by mouth daily with breakfast.     ELIQUIS 2.5 MG TABS tablet TAKE 1 TABLET BY MOUTH TWICE A DAY 60 tablet 4   feeding supplement (ENSURE ENLIVE / ENSURE PLUS) LIQD Take 237 mLs by mouth 2 (two) times daily between meals. 237 mL 12   heparin lock flush 100 UNIT/ML SOLN injection 2.5 mLs (250 Units total) by Intracatheter route 2 (two) times a week. Inject 2.5 ml to each lumen of PICC line twice weekly after NS flush 120 mL 2   megestrol (MEGACE) 40 MG/ML suspension TAKE 10 MLS (400 MG TOTAL) BY MOUTH 2 (TWO) TIMES DAILY. 480 mL 2   Multiple Vitamin (MULTIVITAMIN) tablet Take 1 tablet by mouth daily.     nystatin (MYCOSTATIN) 100000 UNIT/ML suspension Take 5 mLs (500,000 Units total) by mouth 4 (four) times daily. 60 mL 0   oxybutynin (DITROPAN) 5 MG tablet Take 5 mg by mouth daily.     potassium chloride SA (KLOR-CON) 20 MEQ tablet Take 1 tablet daily 30 tablet 1   sodium chloride flush 0.9 % SOLN injection 10 mLs by Intracatheter route 2 (two) times a week. Inject into both PICC line ports twice weekly 480 mL 2   vitamin B-12 (CYANOCOBALAMIN) 100 MCG tablet Take 100 mcg by mouth daily.     zolpidem (AMBIEN) 5 MG tablet TAKE 1 TABLET BY MOUTH AT BEDTIME AS NEEDED FOR SLEEP. 30 tablet 0   No current facility-administered medications for this visit.    ALLERGIES:  Allergies  Allergen Reactions   Penicillin G Rash    PHYSICAL EXAM:  Performance status (ECOG): 2 - Symptomatic, <50% confined to bed  There were no vitals filed for this visit. Wt Readings from Last 3 Encounters:  03/04/21 142 lb 4.8 oz (64.5 kg)  02/11/21 143 lb 6.4 oz (65 kg)  01/21/21 138 lb 3.7 oz (62.7 kg)   Physical Exam Vitals reviewed.  Constitutional:      Appearance: Normal appearance.  Cardiovascular:     Rate and Rhythm: Normal rate and regular rhythm.      Pulses: Normal pulses.     Heart sounds: Normal heart sounds.  Pulmonary:     Effort: Pulmonary effort is normal.     Breath sounds: Normal breath sounds.  Musculoskeletal:     Right lower leg: No edema.     Left lower leg: 1+ Edema present.  Neurological:     General: No focal deficit present.     Mental Status: He is alert and oriented to person, place, and time.  Psychiatric:        Mood and Affect: Mood  normal.        Behavior: Behavior normal.    LABORATORY DATA:  I have reviewed the labs as listed.  CBC Latest Ref Rng & Units 03/19/2021 03/12/2021 03/04/2021  WBC 4.0 - 10.5 K/uL 7.4 4.7 5.9  Hemoglobin 13.0 - 17.0 g/dL 11.2(L) 10.7(L) 11.5(L)  Hematocrit 39.0 - 52.0 % 34.6(L) 32.2(L) 34.9(L)  Platelets 150 - 400 K/uL 178 73(L) 157   CMP Latest Ref Rng & Units 03/19/2021 03/12/2021 03/04/2021  Glucose 70 - 99 mg/dL 118(H) 130(H) 162(H)  BUN 8 - 23 mg/dL _0 Creatinine 0.61 - 1.24 mg/dL 0.55(L) 0.60(L) 0.66  Sodium 135 - 145 mmol/L 139 139 142  Potassium 3.5 - 5.1 mmol/L 3.6 3.4(L) 3.3(L)  Chloride 98 - 111 mmol/L 110 109 110  CO2 22 - 32 mmol/L _1 Calcium 8.9 - 10.3 mg/dL 9.6 9.3 9.6  Total Protein 6.5 - 8.1 g/dL 6.5 6.5 6.3(L)  Total Bilirubin 0.3 - 1.2 mg/dL 0.8 0.9 1.4(H)  Alkaline Phos 38 - 126 U/L 99 113 70  AST 15 - 41 U/L 31 37 36  ALT 0 - 44 U/L 25 32 25    DIAGNOSTIC IMAGING:  I have independently reviewed the scans and discussed with the patient. DG Ribs Unilateral Left  Result Date: 03/15/2021 CLINICAL DATA:  Fall with left rib pain EXAM: LEFT RIBS - 2 VIEW COMPARISON:  None. FINDINGS: No fracture or other bone lesions are seen involving the ribs. Partially visualized right upper extremity central venous catheter tip over the proximal right atrium IMPRESSION: Negative. Electronically Signed   By: Donavan Foil M.D.   On: 03/15/2021 22:05     ASSESSMENT:  1.  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  Perirenal spaces with periaortic/retroperitoneal adenopathy, nodular thickening of the adrenal glands.  Ascites and edematous changes in the mesentery with cirrhotic features, portal enteropathy. - Presentation with 30 pound weight loss in the last 6 months. 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  Ki-67 95%.  Positive CD20, BCL6, CD5 and Bcl-2. - FISH positive for BCL6 and MYC rearrangement. - Cycle 1 of R-CVP on 12/11/2020 - Cycle 2 of R-CVP on 12/31/2020  2.  Stage III (ypT4, N2) rectal cancer: - Presentation in November 2016 with mass at 10 cm from the anus, clinical stage II by EUS-T3N0 - Neoadjuvant Xeloda and radiation from 06/01/2015, completed 20/28 planned fractions, discontinued secondary to toxicity. - Low anterior resection in September 2017, ypT4a, N2  3.  Prostate cancer: - Treated with prostatectomy 1990.  4.  Social/family history: - He lives at home with his wife of 12 years.  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  No family history of malignancies.   PLAN:  1.  Double hit lymphoma: - He has completed 5 cycles of chemotherapy. - PET scan after 3 cycles showed marked improvement in the prior adrenal and right perirenal masses as well as retroperitoneal adenopathy.  Mild residual disease, Deauville 2 and left periaortic lymph node Deauville 3.  No new lesions. - He was evaluated in the clinic on 03/12/2021 when she sustained a fall.  It was a mechanical fall when he was in the garden planting roses.  Rib series was done which did not show any fractures. - He denies any signs of PND or orthopnea.  1+ edema in the left ankle and trace edema  on the right ankle. - Reviewed labs today which showed normal LFTs.  LDH was normal at 152.  CBC was grossly normal with platelet count 145. - He complained of increased frequency of urination without dysuria.  Will check UA today. - We will proceed  with cycle 6 of R mini CHOP today. - This will conclude his chemotherapy regimen. - We will see him back in 5 to 6 weeks with repeat PET CT scan.  2.  Weight loss: - Continue Megace twice daily.  Appetite is good.  3.  Hypokalemia: - Continue potassium 20 mEq daily.  Potassium today 3.3.  4.  Atrial fibrillation: - Continue Eliquis.  No bleeding issues reported.  5.  Sleeping difficulty: - He will continue Ambien 5 mg at bedtime.   Orders placed this encounter:  No orders of the defined types were placed in this encounter.    Derek Jack, MD [place] [phone]   I, Thana Ates, am acting as a scribe for Dr. Derek Jack.  I, Derek Jack MD, have reviewed the above documentation for accuracy and completeness, and I agree with the above.

## 2021-03-25 ENCOUNTER — Inpatient Hospital Stay (HOSPITAL_COMMUNITY): Payer: Medicare Other

## 2021-03-25 ENCOUNTER — Other Ambulatory Visit: Payer: Self-pay

## 2021-03-25 ENCOUNTER — Inpatient Hospital Stay (HOSPITAL_BASED_OUTPATIENT_CLINIC_OR_DEPARTMENT_OTHER): Payer: Medicare Other | Admitting: Hematology

## 2021-03-25 ENCOUNTER — Other Ambulatory Visit (HOSPITAL_COMMUNITY): Payer: Self-pay

## 2021-03-25 VITALS — BP 121/63 | HR 91 | Temp 97.3°F | Resp 18

## 2021-03-25 DIAGNOSIS — R300 Dysuria: Secondary | ICD-10-CM | POA: Diagnosis not present

## 2021-03-25 DIAGNOSIS — C8593 Non-Hodgkin lymphoma, unspecified, intra-abdominal lymph nodes: Secondary | ICD-10-CM

## 2021-03-25 DIAGNOSIS — Z5112 Encounter for antineoplastic immunotherapy: Secondary | ICD-10-CM | POA: Diagnosis not present

## 2021-03-25 DIAGNOSIS — C8513 Unspecified B-cell lymphoma, intra-abdominal lymph nodes: Secondary | ICD-10-CM

## 2021-03-25 LAB — COMPREHENSIVE METABOLIC PANEL
ALT: 24 U/L (ref 0–44)
AST: 32 U/L (ref 15–41)
Albumin: 3.4 g/dL — ABNORMAL LOW (ref 3.5–5.0)
Alkaline Phosphatase: 79 U/L (ref 38–126)
Anion gap: 5 (ref 5–15)
BUN: 17 mg/dL (ref 8–23)
CO2: 25 mmol/L (ref 22–32)
Calcium: 9.7 mg/dL (ref 8.9–10.3)
Chloride: 109 mmol/L (ref 98–111)
Creatinine, Ser: 0.69 mg/dL (ref 0.61–1.24)
GFR, Estimated: >60 mL/min (ref >60)
Glucose, Bld: 117 mg/dL — ABNORMAL HIGH (ref 70–99)
Potassium: 3.3 mmol/L — ABNORMAL LOW (ref 3.5–5.1)
Sodium: 139 mmol/L (ref 135–145)
Total Bilirubin: 0.7 mg/dL (ref 0.3–1.2)
Total Protein: 6.4 g/dL — ABNORMAL LOW (ref 6.5–8.1)

## 2021-03-25 LAB — CBC WITH DIFFERENTIAL/PLATELET
Abs Immature Granulocytes: 0.02 10*3/uL (ref 0.00–0.07)
Basophils Absolute: 0.1 10*3/uL (ref 0.0–0.1)
Basophils Relative: 3 %
Eosinophils Absolute: 0.1 10*3/uL (ref 0.0–0.5)
Eosinophils Relative: 2 %
HCT: 33.8 % — ABNORMAL LOW (ref 39.0–52.0)
Hemoglobin: 11.4 g/dL — ABNORMAL LOW (ref 13.0–17.0)
Immature Granulocytes: 0 %
Lymphocytes Relative: 11 %
Lymphs Abs: 0.6 10*3/uL — ABNORMAL LOW (ref 0.7–4.0)
MCH: 36.2 pg — ABNORMAL HIGH (ref 26.0–34.0)
MCHC: 33.7 g/dL (ref 30.0–36.0)
MCV: 107.3 fL — ABNORMAL HIGH (ref 80.0–100.0)
Monocytes Absolute: 1.1 10*3/uL — ABNORMAL HIGH (ref 0.1–1.0)
Monocytes Relative: 22 %
Neutro Abs: 3.3 10*3/uL (ref 1.7–7.7)
Neutrophils Relative %: 62 %
Platelets: 145 10*3/uL — ABNORMAL LOW (ref 150–400)
RBC: 3.15 MIL/uL — ABNORMAL LOW (ref 4.22–5.81)
RDW: 15.2 % (ref 11.5–15.5)
WBC: 5.2 10*3/uL (ref 4.0–10.5)
nRBC: 0 % (ref 0.0–0.2)

## 2021-03-25 LAB — URINALYSIS, ROUTINE W REFLEX MICROSCOPIC
Bilirubin Urine: NEGATIVE
Glucose, UA: NEGATIVE mg/dL
Hgb urine dipstick: NEGATIVE
Ketones, ur: NEGATIVE mg/dL
Leukocytes,Ua: NEGATIVE
Nitrite: NEGATIVE
Protein, ur: NEGATIVE mg/dL
Specific Gravity, Urine: 1.018 (ref 1.005–1.030)
pH: 5 (ref 5.0–8.0)

## 2021-03-25 LAB — LACTATE DEHYDROGENASE: LDH: 152 U/L (ref 98–192)

## 2021-03-25 LAB — MAGNESIUM: Magnesium: 2.1 mg/dL (ref 1.7–2.4)

## 2021-03-25 LAB — URIC ACID: Uric Acid, Serum: 3.6 mg/dL — ABNORMAL LOW (ref 3.7–8.6)

## 2021-03-25 MED ORDER — VINCRISTINE SULFATE CHEMO INJECTION 1 MG/ML
1.0000 mg | Freq: Once | INTRAVENOUS | Status: AC
  Administered 2021-03-25: 1 mg via INTRAVENOUS
  Filled 2021-03-25: qty 1

## 2021-03-25 MED ORDER — HEPARIN SOD (PORK) LOCK FLUSH 100 UNIT/ML IV SOLN
250.0000 [IU] | Freq: Once | INTRAVENOUS | Status: AC | PRN
  Administered 2021-03-25: 250 [IU]

## 2021-03-25 MED ORDER — ACETAMINOPHEN 325 MG PO TABS
650.0000 mg | ORAL_TABLET | Freq: Once | ORAL | Status: AC
  Administered 2021-03-25: 650 mg via ORAL
  Filled 2021-03-25: qty 2

## 2021-03-25 MED ORDER — SODIUM CHLORIDE 0.9 % IV SOLN
590.0000 mg/m2 | Freq: Once | INTRAVENOUS | Status: AC
  Administered 2021-03-25: 1000 mg via INTRAVENOUS
  Filled 2021-03-25: qty 50

## 2021-03-25 MED ORDER — SODIUM CHLORIDE 0.9 % IV SOLN
150.0000 mg | Freq: Once | INTRAVENOUS | Status: AC
  Administered 2021-03-25: 150 mg via INTRAVENOUS
  Filled 2021-03-25: qty 150

## 2021-03-25 MED ORDER — ZOLPIDEM TARTRATE 5 MG PO TABS: 5.0000 mg | ORAL_TABLET | Freq: Every evening | ORAL | 2 refills | Status: AC | PRN

## 2021-03-25 MED ORDER — DIPHENHYDRAMINE HCL 25 MG PO CAPS
25.0000 mg | ORAL_CAPSULE | Freq: Once | ORAL | Status: AC
  Administered 2021-03-25: 25 mg via ORAL
  Filled 2021-03-25: qty 1

## 2021-03-25 MED ORDER — DOXORUBICIN HCL CHEMO IV INJECTION 2 MG/ML
25.0000 mg/m2 | Freq: Once | INTRAVENOUS | Status: AC
Start: 2021-03-25 — End: 2021-03-25
  Administered 2021-03-25: 42 mg via INTRAVENOUS
  Filled 2021-03-25: qty 21

## 2021-03-25 MED ORDER — SODIUM CHLORIDE 0.9 % IV SOLN
375.0000 mg/m2 | Freq: Once | INTRAVENOUS | Status: AC
  Administered 2021-03-25: 600 mg via INTRAVENOUS
  Filled 2021-03-25: qty 50

## 2021-03-25 MED ORDER — SODIUM CHLORIDE 0.9% FLUSH
10.0000 mL | INTRAVENOUS | Status: DC | PRN
  Administered 2021-03-25: 10 mL

## 2021-03-25 MED ORDER — SODIUM CHLORIDE 0.9 % IV SOLN: Freq: Once | INTRAVENOUS | Status: AC

## 2021-03-25 MED ORDER — SODIUM CHLORIDE 0.9 % IV SOLN
10.0000 mg | Freq: Once | INTRAVENOUS | Status: AC
  Administered 2021-03-25: 10 mg via INTRAVENOUS
  Filled 2021-03-25: qty 10

## 2021-03-25 MED ORDER — SODIUM CHLORIDE 0.9 % IV SOLN
8.0000 mg | Freq: Once | INTRAVENOUS | Status: AC
  Administered 2021-03-25: 8 mg via INTRAVENOUS
  Filled 2021-03-25: qty 8

## 2021-03-25 NOTE — Progress Notes (Signed)
Patient has been examined, vital signs and labs have been reviewed by Dr. Katragadda. ANC, Creatinine, LFTs, hemoglobin, and platelets are within treatment parameters per Dr. Katragadda. Patient may proceed with treatment per M.D.   

## 2021-03-25 NOTE — Progress Notes (Signed)
Patient presents today for CVP and Rituximab infusion per providers order.  Vital signs within parameters for treatment.  Labs pending.  Patient has no new complaints at this time.  CVP and Rituximab given today per MD orders.  Stable during infusion without adverse affects.  Vital signs stable.  No complaints at this time.  Discharge from clinic ambulatory in stable condition.  Alert and oriented X 3.  Follow up with [facility] as scheduled. Discharge from clinic via wheelchair in stable condition.  Alert and oriented X 3.  Follow up with [facility] as scheduled.

## 2021-03-25 NOTE — Patient Instructions (Signed)
[place] at [facility] Discharge Instructions  You were seen and examined by Dr. Delton Coombes. We will proceed with your last cycle of treatment today. Return as scheduled for injection.  Return as scheduled for lab work and office visit.   Thank you for choosing [facility] at [facility] to provide your oncology and hematology care.  To afford each patient quality time with our provider, please arrive at least 15 minutes before your scheduled appointment time.   If you have a lab appointment with the [facility] please come in thru the Main Entrance and check in at the main information desk.  You need to re-schedule your appointment should you arrive 10 or more minutes late.  We strive to give you quality time with our providers, and arriving late affects you and other patients whose appointments are after yours.  Also, if you no show three or more times for appointments you may be dismissed from the clinic at the providers discretion.     Again, thank you for choosing [facility].  Our hope is that these requests will decrease the amount of time that you wait before being seen by our physicians.       _____________________________________________________________  Should you have questions after your visit to [facility], please contact our office at [phone] and follow the prompts.  Our office hours are 8:00 a.m. and 4:30 p.m. Monday - Friday.  Please note that voicemails left after 4:00 p.m. may not be returned until the following business day.  We are closed weekends and major holidays.  You do have access to a nurse 24-7, just call the main number to the clinic [phone] and do not press any options, hold on the line and a nurse will answer the phone.    For prescription refill requests, have your pharmacy contact our office and allow 72 hours.    Due to Covid, you will need to wear a mask upon entering the  hospital. If you do not have a mask, a mask will be given to you at the Main Entrance upon arrival. For doctor visits, patients may have 1 support person age 85 or older with them. For treatment visits, patients can not have anyone with them due to social distancing guidelines and our immunocompromised population.

## 2021-03-25 NOTE — Patient Instructions (Signed)
[facility]  Discharge Instructions: Thank you for choosing [facility] to provide your oncology and hematology care.  If you have a lab appointment with the [facility], please come in thru the Main Entrance and check in at the main information desk.  Wear comfortable clothing and clothing appropriate for easy access to any Portacath or PICC line.   We strive to give you quality time with your provider. You may need to reschedule your appointment if you arrive late (15 or more minutes).  Arriving late affects you and other patients whose appointments are after yours.  Also, if you miss three or more appointments without notifying the office, you may be dismissed from the clinic at the provider's discretion.      For prescription refill requests, have your pharmacy contact our office and allow 72 hours for refills to be completed.    Today you received the following chemotherapy and/or immunotherapy agents CVP with Rituximab      To help prevent nausea and vomiting after your treatment, we encourage you to take your nausea medication as directed.  BELOW ARE SYMPTOMS THAT SHOULD BE REPORTED IMMEDIATELY: *FEVER GREATER THAN 100.4 F (38 C) OR HIGHER *CHILLS OR SWEATING *NAUSEA AND VOMITING THAT IS NOT CONTROLLED WITH YOUR NAUSEA MEDICATION *UNUSUAL SHORTNESS OF BREATH *UNUSUAL BRUISING OR BLEEDING *URINARY PROBLEMS (pain or burning when urinating, or frequent urination) *BOWEL PROBLEMS (unusual diarrhea, constipation, pain near the anus) TENDERNESS IN MOUTH AND THROAT WITH OR WITHOUT PRESENCE OF ULCERS (sore throat, sores in mouth, or a toothache) UNUSUAL RASH, SWELLING OR PAIN  UNUSUAL VAGINAL DISCHARGE OR ITCHING   Items with * indicate a potential emergency and should be followed up as soon as possible or go to the Emergency Department if any problems should occur.  Please show the CHEMOTHERAPY ALERT CARD or IMMUNOTHERAPY ALERT CARD at check-in to the  Emergency Department and triage nurse.  Should you have questions after your visit or need to cancel or reschedule your appointment, please contact [facility] [phone]  and follow the prompts.  Office hours are 8:00 a.m. to 4:30 p.m. Monday - Friday. Please note that voicemails left after 4:00 p.m. may not be returned until the following business day.  We are closed weekends and major holidays. You have access to a nurse at all times for urgent questions. Please call the main number to the clinic [phone] and follow the prompts.  For any non-urgent questions, you may also contact your provider using MyChart. We now offer e-Visits for anyone 85 and older to request care online for non-urgent symptoms. For details visit [email or web address].   Also download the MyChart app! Go to the app store, search "MyChart", open the app, select [facility], and log in with your MyChart username and password.  Due to Covid, a mask is required upon entering the hospital/clinic. If you do not have a mask, one will be given to you upon arrival. For doctor visits, patients may have 1 support person aged 10 or older with them. For treatment visits, patients cannot have anyone with them due to current Covid guidelines and our immunocompromised population.

## 2021-03-26 ENCOUNTER — Inpatient Hospital Stay (HOSPITAL_COMMUNITY): Payer: Medicare Other

## 2021-03-26 VITALS — BP 109/46 | HR 81 | Temp 98.3°F | Resp 20

## 2021-03-26 DIAGNOSIS — C8513 Unspecified B-cell lymphoma, intra-abdominal lymph nodes: Secondary | ICD-10-CM

## 2021-03-26 DIAGNOSIS — Z5112 Encounter for antineoplastic immunotherapy: Secondary | ICD-10-CM | POA: Diagnosis not present

## 2021-03-26 LAB — URINE CULTURE: Culture: NO GROWTH

## 2021-03-26 MED ORDER — PEGFILGRASTIM-BMEZ 6 MG/0.6ML ~~LOC~~ SOSY
6.0000 mg | PREFILLED_SYRINGE | Freq: Once | SUBCUTANEOUS | Status: AC
  Administered 2021-03-26: 6 mg via SUBCUTANEOUS
  Filled 2021-03-26: qty 0.6

## 2021-03-26 NOTE — Progress Notes (Signed)
Patient presents today for Ziextenzo injection and PICC line removal per providers orders.  Stable during administration without incident; injection site WNL; see MAR for injection details.  Patient tolerated procedure well and without incident.  No questions or complaints noted at this time.   PICC line removed, catheter measured 37cm.  Petroleum gauze and pressure dressing applied.  Kept patient supine for thirty minutes after removal.  Instructed the patient to leave the dressing on for 48 hours.   Vital signs stable.  No complaints at this time.  Discharge from clinic via wheelchair in stable condition.  Alert and oriented X 3.  Follow up with [facility] as scheduled.

## 2021-03-26 NOTE — Patient Instructions (Signed)
[facility]  Discharge Instructions: Thank you for choosing [facility] to provide your oncology and hematology care.  If you have a lab appointment with the Cancer Center, please come in thru the Main Entrance and check in at the main information desk.  Wear comfortable clothing and clothing appropriate for easy access to any Portacath or PICC line.   We strive to give you quality time with your provider. You may need to reschedule your appointment if you arrive late (15 or more minutes).  Arriving late affects you and other patients whose appointments are after yours.  Also, if you miss three or more appointments without notifying the office, you may be dismissed from the clinic at the provider's discretion.      For prescription refill requests, have your pharmacy contact our office and allow 72 hours for refills to be completed.    Today you received the following chemotherapy and/or immunotherapy agents Ziextenzo      To help prevent nausea and vomiting after your treatment, we encourage you to take your nausea medication as directed.  BELOW ARE SYMPTOMS THAT SHOULD BE REPORTED IMMEDIATELY: *FEVER GREATER THAN 100.4 F (38 C) OR HIGHER *CHILLS OR SWEATING *NAUSEA AND VOMITING THAT IS NOT CONTROLLED WITH YOUR NAUSEA MEDICATION *UNUSUAL SHORTNESS OF BREATH *UNUSUAL BRUISING OR BLEEDING *URINARY PROBLEMS (pain or burning when urinating, or frequent urination) *BOWEL PROBLEMS (unusual diarrhea, constipation, pain near the anus) TENDERNESS IN MOUTH AND THROAT WITH OR WITHOUT PRESENCE OF ULCERS (sore throat, sores in mouth, or a toothache) UNUSUAL RASH, SWELLING OR PAIN  UNUSUAL VAGINAL DISCHARGE OR ITCHING   Items with * indicate a potential emergency and should be followed up as soon as possible or go to the Emergency Department if any problems should occur.  Please show the CHEMOTHERAPY ALERT CARD or IMMUNOTHERAPY ALERT CARD at check-in to the Emergency  Department and triage nurse.  Should you have questions after your visit or need to cancel or reschedule your appointment, please contact [facility] [phone]  and follow the prompts.  Office hours are 8:00 a.m. to 4:30 p.m. Monday - Friday. Please note that voicemails left after 4:00 p.m. may not be returned until the following business day.  We are closed weekends and major holidays. You have access to a nurse at all times for urgent questions. Please call the main number to the clinic [phone] and follow the prompts.  For any non-urgent questions, you may also contact your provider using MyChart. We now offer e-Visits for anyone 18 and older to request care online for non-urgent symptoms. For details visit [email or web address].   Also download the MyChart app! Go to the app store, search "MyChart", open the app, select [facility], and log in with your MyChart username and password.  Due to Covid, a mask is required upon entering the hospital/clinic. If you do not have a mask, one will be given to you upon arrival. For doctor visits, patients may have 1 support person aged 18 or older with them. For treatment visits, patients cannot have anyone with them due to current Covid guidelines and our immunocompromised population.  

## 2021-03-27 ENCOUNTER — Other Ambulatory Visit (HOSPITAL_COMMUNITY): Payer: Self-pay | Admitting: Hematology

## 2021-03-27 DIAGNOSIS — C8593 Non-Hodgkin lymphoma, unspecified, intra-abdominal lymph nodes: Secondary | ICD-10-CM

## 2021-03-29 ENCOUNTER — Encounter: Payer: Self-pay | Admitting: Oncology

## 2021-04-24 ENCOUNTER — Other Ambulatory Visit (HOSPITAL_COMMUNITY): Payer: Self-pay | Admitting: Hematology

## 2021-04-24 DIAGNOSIS — C8593 Non-Hodgkin lymphoma, unspecified, intra-abdominal lymph nodes: Secondary | ICD-10-CM

## 2021-04-26 ENCOUNTER — Encounter: Payer: Self-pay | Admitting: Oncology

## 2021-04-29 ENCOUNTER — Ambulatory Visit (HOSPITAL_COMMUNITY)
Admission: RE | Admit: 2021-04-29 | Discharge: 2021-04-29 | Disposition: A | Discharge status: Home / Self Care | Payer: Medicare Other | Source: Ambulatory Visit | Attending: Hematology | Admitting: Hematology

## 2021-04-29 ENCOUNTER — Inpatient Hospital Stay (HOSPITAL_COMMUNITY): Payer: Medicare Other | Attending: Hematology

## 2021-04-29 ENCOUNTER — Other Ambulatory Visit: Payer: Self-pay

## 2021-04-29 DIAGNOSIS — R634 Abnormal weight loss: Secondary | ICD-10-CM | POA: Insufficient documentation

## 2021-04-29 DIAGNOSIS — Z87891 Personal history of nicotine dependence: Secondary | ICD-10-CM | POA: Insufficient documentation

## 2021-04-29 DIAGNOSIS — I4891 Unspecified atrial fibrillation: Secondary | ICD-10-CM | POA: Insufficient documentation

## 2021-04-29 DIAGNOSIS — C8593 Non-Hodgkin lymphoma, unspecified, intra-abdominal lymph nodes: Secondary | ICD-10-CM | POA: Insufficient documentation

## 2021-04-29 DIAGNOSIS — E876 Hypokalemia: Secondary | ICD-10-CM | POA: Insufficient documentation

## 2021-04-29 DIAGNOSIS — Z7901 Long term (current) use of anticoagulants: Secondary | ICD-10-CM | POA: Insufficient documentation

## 2021-04-29 DIAGNOSIS — Z85048 Personal history of other malignant neoplasm of rectum, rectosigmoid junction, and anus: Secondary | ICD-10-CM | POA: Insufficient documentation

## 2021-04-29 DIAGNOSIS — Z8546 Personal history of malignant neoplasm of prostate: Secondary | ICD-10-CM | POA: Insufficient documentation

## 2021-04-29 DIAGNOSIS — C8590 Non-Hodgkin lymphoma, unspecified, unspecified site: Secondary | ICD-10-CM | POA: Insufficient documentation

## 2021-04-29 LAB — COMPREHENSIVE METABOLIC PANEL
ALT: 38 U/L (ref 0–44)
AST: 46 U/L — ABNORMAL HIGH (ref 15–41)
Albumin: 3.5 g/dL (ref 3.5–5.0)
Alkaline Phosphatase: 86 U/L (ref 38–126)
Anion gap: 5 (ref 5–15)
BUN: 17 mg/dL (ref 8–23)
CO2: 28 mmol/L (ref 22–32)
Calcium: 9.8 mg/dL (ref 8.9–10.3)
Chloride: 106 mmol/L (ref 98–111)
Creatinine, Ser: 0.69 mg/dL (ref 0.61–1.24)
GFR, Estimated: >60 mL/min (ref >60)
Glucose, Bld: 122 mg/dL — ABNORMAL HIGH (ref 70–99)
Potassium: 3.7 mmol/L (ref 3.5–5.1)
Sodium: 139 mmol/L (ref 135–145)
Total Bilirubin: 1 mg/dL (ref 0.3–1.2)
Total Protein: 6.3 g/dL — ABNORMAL LOW (ref 6.5–8.1)

## 2021-04-29 LAB — URIC ACID: Uric Acid, Serum: 3.9 mg/dL (ref 3.7–8.6)

## 2021-04-29 LAB — MAGNESIUM: Magnesium: 2.1 mg/dL (ref 1.7–2.4)

## 2021-04-29 LAB — LACTATE DEHYDROGENASE: LDH: 199 U/L — ABNORMAL HIGH (ref 98–192)

## 2021-04-29 MED ORDER — FLUDEOXYGLUCOSE F - 18 (FDG) INJECTION
7.6230 | Freq: Once | INTRAVENOUS | Status: AC | PRN
  Administered 2021-04-29: 7.623 via INTRAVENOUS

## 2021-05-02 NOTE — Progress Notes (Signed)
Cody Hurley [address] [place] [address]   CLINIC:  Medical Oncology/Hematology  PCP:  [name], MD [address] [place] [address] [phone]   REASON FOR VISIT:  Follow-up for Non-Hodgkin's lymphoma and history of rectal cancer  PRIOR THERAPY: none  NGS Results: not done  CURRENT THERAPY: CVP + Rituximab every 3 weeks  BRIEF ONCOLOGIC HISTORY:  Oncology History  Non-Hodgkin lymphoma ([place])  12/09/2020 Initial Diagnosis   Non-Hodgkin lymphoma ([place])   12/11/2020 -  Chemotherapy   Patient is on Treatment Plan : NON-HODGKINS LYMPHOMA CVP + Rituximab q21d       CANCER STAGING:  Cancer Staging  No matching staging information was found for the patient.  INTERVAL HISTORY:  Cody Hurley, a 87 y.o. male, returns for routine follow-up of his Non-Hodgkin's lymphoma and history of rectal cancer. Lannie was last seen on 03/25/2021.   Today he reports feeling well. He drinks 2 Boost daily along with small portions or solid food. He reports fatigue, and he needs assistance to stand and shower; he is able use a stationary bike to bike about 1-2 miles a day. He also reports frequent urination, especially at night. He is not currently taking Ambien or Megace. He is not currently in physical therapy. He reports stable numbness in his hands due to carpal tunnel. He denies nausea and vomiting. He reports a red swollen scratch on his right leg.   REVIEW OF SYSTEMS:  Review of Systems  Constitutional:  Positive for fatigue. Negative for appetite change (75%).  Respiratory:  Positive for cough.   Gastrointestinal:  Negative for nausea and vomiting.  Genitourinary:  Positive for frequency and nocturia.   Musculoskeletal:  Positive for arthralgias (hands - carpal tunnel).  Skin:  Positive for wound (scratch on R leg).  Neurological:  Positive for dizziness and numbness.  Psychiatric/Behavioral:  The patient is nervous/anxious.   All other systems  reviewed and are negative.  PAST MEDICAL/SURGICAL HISTORY:  Past Medical History:  Diagnosis Date   Colorectal cancer ([place]) 2016   Prostate cancer [facility])    Past Surgical History:  Procedure Laterality Date   COLOSTOMY     MELANOMA EXCISION     ear   PROSTATE SURGERY  1991    SOCIAL HISTORY:  Social History   Socioeconomic History   Marital status: Married    Spouse name: Not on file   Number of children: Not on file   Years of education: Not on file   Highest education level: Not on file  Occupational History   Not on file  Tobacco Use   Smoking status: Former    Packs/day: 0.50    Types: Cigarettes    Quit date: 79    Years since quitting: 74.9   Smokeless tobacco: Never  Vaping Use   Vaping Use: Never used  Substance and Sexual Activity   Alcohol use: Never   Drug use: Never   Sexual activity: Not on file  Other Topics Concern   Not on file  Social History Narrative   Not on file   Social Determinants of Health   Financial Resource Strain: Low Risk    Difficulty of Paying Living Expenses: Not very hard  Food Insecurity: No Food Insecurity   Worried About Running Out of Food in the Last Year: Never true   [place] in the Last Year: Never true  Transportation Needs: No Transportation Needs   Lack of Transportation (  Medical): No   Lack of Transportation (Non-Medical): No  Physical Activity: Inactive   Days of Exercise per Week: 0 days   Minutes of Exercise per Session: 0 min  Stress: No Stress Concern Present   Feeling of Stress : Not at all  Social Connections: Moderately Integrated   Frequency of Communication with Friends and Family: Three times a week   Frequency of Social Gatherings with Friends and Family: Three times a week   Attends Religious Services: 1 to 4 times per year   Active Member of Clubs or Organizations: No   Attends Archivist Meetings: Never   Marital Status: Married  Human resources officer Violence: Not At Risk    Fear of Current or Ex-Partner: No   Emotionally Abused: No   Physically Abused: No   Sexually Abused: No    FAMILY HISTORY:  No family history on file.  CURRENT MEDICATIONS:  Current Outpatient Medications  Medication Sig Dispense Refill   Cholecalciferol (VITAMIN D-3) 25 MCG (1000 UT) CAPS Take 2,000 Units by mouth daily with breakfast.     ELIQUIS 2.5 MG TABS tablet TAKE 1 TABLET BY MOUTH TWICE A DAY 60 tablet 4   feeding supplement (ENSURE ENLIVE / ENSURE PLUS) LIQD Take 237 mLs by mouth 2 (two) times daily between meals. 237 mL 12   heparin lock flush 100 UNIT/ML SOLN injection 2.5 mLs (250 Units total) by Intracatheter route 2 (two) times a week. Inject 2.5 ml to each lumen of PICC line twice weekly after NS flush 120 mL 2   Multiple Vitamin (MULTIVITAMIN) tablet Take 1 tablet by mouth daily.     nystatin (MYCOSTATIN) 100000 UNIT/ML suspension Take 5 mLs (500,000 Units total) by mouth 4 (four) times daily. 60 mL 0   oxybutynin (DITROPAN) 5 MG tablet Take 5 mg by mouth daily.     potassium chloride SA (KLOR-CON M20) 20 MEQ tablet TAKE 1 TABLET BY MOUTH EVERY DAY 30 tablet 1   sodium chloride flush 0.9 % SOLN injection 10 mLs by Intracatheter route 2 (two) times a week. Inject into both PICC line ports twice weekly 480 mL 2   vitamin B-12 (CYANOCOBALAMIN) 100 MCG tablet Take 100 mcg by mouth daily.     zolpidem (AMBIEN) 5 MG tablet Take 1 tablet (5 mg total) by mouth at bedtime as needed. for sleep 30 tablet 2   acetaminophen (TYLENOL) 500 MG tablet Take 500-1,000 mg by mouth every 6 (six) hours as needed for mild pain (or headaches). (Patient not taking: Reported on 05/03/2021)     No current facility-administered medications for this visit.    ALLERGIES:  Allergies  Allergen Reactions   Penicillin G Rash    PHYSICAL EXAM:  Performance status (ECOG): 2 - Symptomatic, <50% confined to bed  Vitals:   05/03/21 1104  BP: (!) 112/54  Pulse: 85  Resp: 18  Temp: 97.6 F (36.4  C)  SpO2: 97%   Wt Readings from Last 3 Encounters:  05/03/21 144 lb 13.5 oz (65.7 kg)  03/25/21 139 lb 6.4 oz (63.2 kg)  03/04/21 142 lb 4.8 oz (64.5 kg)   Physical Exam Vitals reviewed.  Constitutional:      Appearance: Normal appearance. He is obese.     Comments: In wheelchair  Cardiovascular:     Rate and Rhythm: Normal rate and regular rhythm.     Pulses: Normal pulses.     Heart sounds: Normal heart sounds.  Pulmonary:     Effort: Pulmonary effort  is normal.     Breath sounds: Normal breath sounds.  Musculoskeletal:     Right lower leg: 1+ Edema present.     Left lower leg: 1+ Edema present.  Neurological:     General: No focal deficit present.     Mental Status: He is alert and oriented to person, place, and time.  Psychiatric:        Mood and Affect: Mood normal.        Behavior: Behavior normal.     LABORATORY DATA:  I have reviewed the labs as listed.  CBC Latest Ref Rng & Units 04/29/2021 03/25/2021 03/19/2021  WBC 4.0 - 10.5 K/uL 3.8(L) 5.2 7.4  Hemoglobin 13.0 - 17.0 g/dL 11.7(L) 11.4(L) 11.2(L)  Hematocrit 39.0 - 52.0 % 35.8(L) 33.8(L) 34.6(L)  Platelets 150 - 400 K/uL 116(L) 145(L) 178   CMP Latest Ref Rng & Units 04/29/2021 03/25/2021 03/19/2021  Glucose 70 - 99 mg/dL 122(H) 117(H) 118(H)  BUN 8 - 23 mg/dL _0 Creatinine 0.61 - 1.24 mg/dL 0.69 0.69 0.55(L)  Sodium 135 - 145 mmol/L 139 139 139  Potassium 3.5 - 5.1 mmol/L 3.7 3.3(L) 3.6  Chloride 98 - 111 mmol/L 106 109 110  CO2 22 - 32 mmol/L _1 Calcium 8.9 - 10.3 mg/dL 9.8 9.7 9.6  Total Protein 6.5 - 8.1 g/dL 6.3(L) 6.4(L) 6.5  Total Bilirubin 0.3 - 1.2 mg/dL 1.0 0.7 0.8  Alkaline Phos 38 - 126 U/L 86 79 99  AST 15 - 41 U/L 46(H) 32 31  ALT 0 - 44 U/L 38 24 25    DIAGNOSTIC IMAGING:  I have independently reviewed the scans and discussed with the patient. No results found.   ASSESSMENT:  1.  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  Perirenal spaces with periaortic/retroperitoneal adenopathy, nodular thickening of the adrenal glands.  Ascites and edematous changes in the mesentery with cirrhotic features, portal enteropathy. - Presentation with 30 pound weight loss in the last 6 months. 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  Ki-67 95%.  Positive CD20, BCL6, CD5 and Bcl-2. - FISH positive for BCL6 and MYC rearrangement. - 6 cycles of R mini CHOP from 12/11/2020 through 03/25/2021.  2.  Stage III (ypT4, N2) rectal cancer: - Presentation in November 2016 with mass at 10 cm from the anus, clinical stage II by EUS-T3N0 - Neoadjuvant Xeloda and radiation from 06/01/2015, completed 20/28 planned fractions, discontinued secondary to toxicity. - Low anterior resection in September 2017, ypT4a, N2  3.  Prostate cancer: - Treated with prostatectomy 1990.  4.  Social/family history: - He lives at home with his wife of 12 years.  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  No family history of malignancies.   PLAN:  1.  Double hit lymphoma: - He has completed cycle 6 on 03/25/2021. - He reports feeling low energy. - Reviewed labs from 04/29/2021 which showed mildly elevated AST.  LDH was also mildly elevated at 199.  CBC shows white count 3.8 and platelet count 116 with hemoglobin 11.7. - Reviewed PET CT scan images which showed stable findings.  There is some uptake in the anterior chest wall muscles, patient reports that he was shivering throughout the scan as it was very cold.  We will await for the final report. - Recommend resuming home physical therapy weekly. - RTC 6 weeks for  follow-up with repeat labs and exam.  2.  Weight loss: - He is not requiring Megace.  He is eating very well.  3.  Hypokalemia: - He is taking potassium 20 mEq daily.  Potassium today 3.7. - I have told him to cut back on potassium to half tablet daily and finish  the bottle.  4.  Atrial fibrillation: - Continue Eliquis.  No bleeding issues reported.  5.  Sleeping difficulty: - He is not requiring Ambien at this time.   Orders placed this encounter:  No orders of the defined types were placed in this encounter.    Derek Jack, MD [facility] [phone]   I, Thana Ates, am acting as a scribe for Dr. Derek Jack.  I, Derek Jack MD, have reviewed the above documentation for accuracy and completeness, and I agree with the above.

## 2021-05-03 ENCOUNTER — Other Ambulatory Visit: Payer: Self-pay

## 2021-05-03 ENCOUNTER — Inpatient Hospital Stay (HOSPITAL_BASED_OUTPATIENT_CLINIC_OR_DEPARTMENT_OTHER): Payer: Medicare Other | Admitting: Hematology

## 2021-05-03 VITALS — BP 112/54 | HR 85 | Temp 97.6°F | Resp 18 | Ht 67.32 in | Wt 144.8 lb

## 2021-05-03 DIAGNOSIS — E876 Hypokalemia: Secondary | ICD-10-CM | POA: Diagnosis not present

## 2021-05-03 DIAGNOSIS — Z87891 Personal history of nicotine dependence: Secondary | ICD-10-CM | POA: Diagnosis not present

## 2021-05-03 DIAGNOSIS — C8590 Non-Hodgkin lymphoma, unspecified, unspecified site: Secondary | ICD-10-CM | POA: Diagnosis present

## 2021-05-03 DIAGNOSIS — C8513 Unspecified B-cell lymphoma, intra-abdominal lymph nodes: Secondary | ICD-10-CM | POA: Diagnosis not present

## 2021-05-03 DIAGNOSIS — R634 Abnormal weight loss: Secondary | ICD-10-CM | POA: Diagnosis not present

## 2021-05-03 DIAGNOSIS — Z8546 Personal history of malignant neoplasm of prostate: Secondary | ICD-10-CM | POA: Diagnosis not present

## 2021-05-03 DIAGNOSIS — I4891 Unspecified atrial fibrillation: Secondary | ICD-10-CM | POA: Diagnosis not present

## 2021-05-03 DIAGNOSIS — Z85048 Personal history of other malignant neoplasm of rectum, rectosigmoid junction, and anus: Secondary | ICD-10-CM | POA: Diagnosis not present

## 2021-05-03 DIAGNOSIS — Z7901 Long term (current) use of anticoagulants: Secondary | ICD-10-CM | POA: Diagnosis not present

## 2021-05-03 NOTE — Patient Instructions (Addendum)
[facility] at [facility] Discharge Instructions  You were seen and examined today by Dr. Delton Coombes. He reviewed your most recent labs and scan. All of your labs look okay. Scan looks stable but the Radiologist has not read it yet. We will follow up on this and if there is anything new we will call. Continue taking the potassium 1/2 a tablet until you finish the bottle and then stop it. Please keep follow up as scheduled.   Thank you for choosing [facility] at [facility] to provide your oncology and hematology care.  To afford each patient quality time with our provider, please arrive at least 15 minutes before your scheduled appointment time.   If you have a lab appointment with the [facility] please come in thru the Main Entrance and check in at the main information desk.  You need to re-schedule your appointment should you arrive 10 or more minutes late.  We strive to give you quality time with our providers, and arriving late affects you and other patients whose appointments are after yours.  Also, if you no show three or more times for appointments you may be dismissed from the clinic at the providers discretion.     Again, thank you for choosing [facility].  Our hope is that these requests will decrease the amount of time that you wait before being seen by our physicians.       _____________________________________________________________  Should you have questions after your visit to [facility], please contact our office at [phone] and follow the prompts.  Our office hours are 8:00 a.m. and 4:30 p.m. Monday - Friday.  Please note that voicemails left after 4:00 p.m. may not be returned until the following business day.  We are closed weekends and major holidays.  You do have access to a nurse 24-7, just call the main number to the clinic [phone] and do not press any options, hold on the line and a nurse  will answer the phone.    For prescription refill requests, have your pharmacy contact our office and allow 72 hours.    Due to Covid, you will need to wear a mask upon entering the hospital. If you do not have a mask, a mask will be given to you at the Main Entrance upon arrival. For doctor visits, patients may have 1 support person age 85 or older with them. For treatment visits, patients can not have anyone with them due to social distancing guidelines and our immunocompromised population.

## 2021-05-04 LAB — CBC WITH DIFFERENTIAL/PLATELET
Abs Immature Granulocytes: 0.01 10*3/uL (ref 0.00–0.07)
Basophils Absolute: 0.1 10*3/uL (ref 0.0–0.1)
Basophils Relative: 2 %
Eosinophils Absolute: 0.3 10*3/uL (ref 0.0–0.5)
Eosinophils Relative: 7 %
HCT: 35.8 % — ABNORMAL LOW (ref 39.0–52.0)
Hemoglobin: 11.7 g/dL — ABNORMAL LOW (ref 13.0–17.0)
Immature Granulocytes: 0 %
Lymphocytes Relative: 20 %
Lymphs Abs: 0.8 10*3/uL (ref 0.7–4.0)
MCH: 34.8 pg — ABNORMAL HIGH (ref 26.0–34.0)
MCHC: 32.7 g/dL (ref 30.0–36.0)
MCV: 106.5 fL — ABNORMAL HIGH (ref 80.0–100.0)
Monocytes Absolute: 0.8 10*3/uL (ref 0.1–1.0)
Monocytes Relative: 22 %
Neutro Abs: 1.9 10*3/uL (ref 1.7–7.7)
Neutrophils Relative %: 49 %
Platelets: 116 10*3/uL — ABNORMAL LOW (ref 150–400)
RBC: 3.36 MIL/uL — ABNORMAL LOW (ref 4.22–5.81)
RDW: 14.6 % (ref 11.5–15.5)
WBC: 3.8 10*3/uL — ABNORMAL LOW (ref 4.0–10.5)
nRBC: 0 % (ref 0.0–0.2)

## 2021-05-10 ENCOUNTER — Other Ambulatory Visit (HOSPITAL_COMMUNITY): Payer: Self-pay | Admitting: Hematology

## 2021-05-10 DIAGNOSIS — C8593 Non-Hodgkin lymphoma, unspecified, intra-abdominal lymph nodes: Secondary | ICD-10-CM

## 2021-05-11 ENCOUNTER — Encounter: Payer: Self-pay | Admitting: Oncology

## 2021-06-16 ENCOUNTER — Other Ambulatory Visit: Payer: Self-pay

## 2021-06-16 ENCOUNTER — Inpatient Hospital Stay (HOSPITAL_COMMUNITY): Payer: Medicare Other | Attending: Hematology | Admitting: Hematology

## 2021-06-16 ENCOUNTER — Inpatient Hospital Stay (HOSPITAL_COMMUNITY): Payer: Medicare Other

## 2021-06-16 VITALS — BP 128/50 | HR 80 | Temp 96.8°F | Resp 17 | Wt 144.4 lb

## 2021-06-16 DIAGNOSIS — C8590 Non-Hodgkin lymphoma, unspecified, unspecified site: Secondary | ICD-10-CM | POA: Diagnosis present

## 2021-06-16 DIAGNOSIS — I4891 Unspecified atrial fibrillation: Secondary | ICD-10-CM | POA: Diagnosis not present

## 2021-06-16 DIAGNOSIS — C20 Malignant neoplasm of rectum: Secondary | ICD-10-CM | POA: Insufficient documentation

## 2021-06-16 DIAGNOSIS — C61 Malignant neoplasm of prostate: Secondary | ICD-10-CM | POA: Diagnosis not present

## 2021-06-16 DIAGNOSIS — R634 Abnormal weight loss: Secondary | ICD-10-CM | POA: Diagnosis not present

## 2021-06-16 DIAGNOSIS — C8593 Non-Hodgkin lymphoma, unspecified, intra-abdominal lymph nodes: Secondary | ICD-10-CM

## 2021-06-16 DIAGNOSIS — C8513 Unspecified B-cell lymphoma, intra-abdominal lymph nodes: Secondary | ICD-10-CM | POA: Diagnosis not present

## 2021-06-16 LAB — CBC WITH DIFFERENTIAL/PLATELET
Abs Immature Granulocytes: 0.04 10*3/uL (ref 0.00–0.07)
Basophils Absolute: 0 10*3/uL (ref 0.0–0.1)
Basophils Relative: 0 %
Eosinophils Absolute: 0 10*3/uL (ref 0.0–0.5)
Eosinophils Relative: 1 %
HCT: 39.4 % (ref 39.0–52.0)
Hemoglobin: 12.7 g/dL — ABNORMAL LOW (ref 13.0–17.0)
Immature Granulocytes: 1 %
Lymphocytes Relative: 6 %
Lymphs Abs: 0.4 10*3/uL — ABNORMAL LOW (ref 0.7–4.0)
MCH: 33.2 pg (ref 26.0–34.0)
MCHC: 32.2 g/dL (ref 30.0–36.0)
MCV: 103.1 fL — ABNORMAL HIGH (ref 80.0–100.0)
Monocytes Absolute: 0.7 10*3/uL (ref 0.1–1.0)
Monocytes Relative: 10 %
Neutro Abs: 5.9 10*3/uL (ref 1.7–7.7)
Neutrophils Relative %: 82 %
Platelets: 121 10*3/uL — ABNORMAL LOW (ref 150–400)
RBC: 3.82 MIL/uL — ABNORMAL LOW (ref 4.22–5.81)
RDW: 13.3 % (ref 11.5–15.5)
WBC: 7.1 10*3/uL (ref 4.0–10.5)
nRBC: 0 % (ref 0.0–0.2)

## 2021-06-16 LAB — COMPREHENSIVE METABOLIC PANEL
ALT: 44 U/L (ref 0–44)
AST: 37 U/L (ref 15–41)
Albumin: 3.8 g/dL (ref 3.5–5.0)
Alkaline Phosphatase: 85 U/L (ref 38–126)
Anion gap: 8 (ref 5–15)
BUN: 25 mg/dL — ABNORMAL HIGH (ref 8–23)
CO2: 27 mmol/L (ref 22–32)
Calcium: 9.6 mg/dL (ref 8.9–10.3)
Chloride: 104 mmol/L (ref 98–111)
Creatinine, Ser: 0.73 mg/dL (ref 0.61–1.24)
GFR, Estimated: >60 mL/min (ref >60)
Glucose, Bld: 122 mg/dL — ABNORMAL HIGH (ref 70–99)
Potassium: 3.7 mmol/L (ref 3.5–5.1)
Sodium: 139 mmol/L (ref 135–145)
Total Bilirubin: 0.9 mg/dL (ref 0.3–1.2)
Total Protein: 6.8 g/dL (ref 6.5–8.1)

## 2021-06-16 LAB — LACTATE DEHYDROGENASE: LDH: 159 U/L (ref 98–192)

## 2021-06-16 NOTE — Progress Notes (Signed)
[employer] [facility] [address], [place] [address]   CLINIC:  Medical Oncology/Hematology  PCP:  [name], MD [address] [place] [address] [phone]   REASON FOR VISIT:  Follow-up for Non-Hodgkin's lymphoma and history of rectal cancer  PRIOR THERAPY: none  NGS Results: not done  CURRENT THERAPY: CVP + Rituximab every 3 weeks  BRIEF ONCOLOGIC HISTORY:  Oncology History  Non-Hodgkin lymphoma ([place])  12/09/2020 Initial Diagnosis   Non-Hodgkin lymphoma ([place])   12/11/2020 -  Chemotherapy   Patient is on Treatment Plan : NON-HODGKINS LYMPHOMA CVP + Rituximab q21d       CANCER STAGING:  Cancer Staging  No matching staging information was found for the patient.  INTERVAL HISTORY:  Cody Hurley, a 88 y.o. male, returns for routine follow-up of his non-Hodgkin's lymphoma and history of rectal cancer. Cody Hurley was last seen on 05/03/2021.   Today he reports feeling good. His appetite is good, but he reports occasional fatigue. He is walking without assistance of a cane at home. He reports regular BM. His weight is stable. He reports stable numbness in his hands, and he denies tingling/numbness in his feet.   REVIEW OF SYSTEMS:  Review of Systems  Constitutional:  Positive for fatigue. Negative for appetite change and unexpected weight change.  Respiratory:  Positive for cough.   Gastrointestinal:  Negative for constipation and diarrhea.  Genitourinary:  Positive for frequency.   Neurological:  Positive for numbness (hands).  All other systems reviewed and are negative.  PAST MEDICAL/SURGICAL HISTORY:  Past Medical History:  Diagnosis Date   Colorectal cancer ([place]) 2016   Prostate cancer [facility])    Past Surgical History:  Procedure Laterality Date   COLOSTOMY     MELANOMA EXCISION     ear   PROSTATE SURGERY  1991    SOCIAL HISTORY:  Social History   Socioeconomic History   Marital status: Married    Spouse name: Not on file    Number of children: Not on file   Years of education: Not on file   Highest education level: Not on file  Occupational History   Not on file  Tobacco Use   Smoking status: Former    Packs/day: 0.50    Types: Cigarettes    Quit date: 34    Years since quitting: 75.0   Smokeless tobacco: Never  Vaping Use   Vaping Use: Never used  Substance and Sexual Activity   Alcohol use: Never   Drug use: Never   Sexual activity: Not on file  Other Topics Concern   Not on file  Social History Narrative   Not on file   Social Determinants of Health   Financial Resource Strain: Low Risk    Difficulty of Paying Living Expenses: Not very hard  Food Insecurity: No Food Insecurity   Worried About Charity fundraiser in the Last Year: Never true   [place] in the Last Year: Never true  Transportation Needs: No Transportation Needs   Lack of Transportation (Medical): No   Lack of Transportation (Non-Medical): No  Physical Activity: Inactive   Days of Exercise per Week: 0 days   Minutes of Exercise per Session: 0 min  Stress: No Stress Concern Present   Feeling of Stress : Not at all  Social Connections: Moderately Integrated   Frequency of Communication with Friends and Family: Three times a week   Frequency of Social Gatherings with Friends and  Family: Three times a week   Attends Religious Services: 1 to 4 times per year   Active Member of Clubs or Organizations: No   Attends Archivist Meetings: Never   Marital Status: Married  Human resources officer Violence: Not At Risk   Fear of Current or Ex-Partner: No   Emotionally Abused: No   Physically Abused: No   Sexually Abused: No    FAMILY HISTORY:  No family history on file.  CURRENT MEDICATIONS:  Current Outpatient Medications  Medication Sig Dispense Refill   Cholecalciferol (VITAMIN D-3) 25 MCG (1000 UT) CAPS Take 2,000 Units by mouth daily with breakfast.     ELIQUIS 2.5 MG TABS tablet TAKE 1 TABLET BY MOUTH  TWICE A DAY 60 tablet 4   feeding supplement (ENSURE ENLIVE / ENSURE PLUS) LIQD Take 237 mLs by mouth 2 (two) times daily between meals. 237 mL 12   heparin lock flush 100 UNIT/ML SOLN injection 2.5 mLs (250 Units total) by Intracatheter route 2 (two) times a week. Inject 2.5 ml to each lumen of PICC line twice weekly after NS flush 120 mL 2   Multiple Vitamin (MULTIVITAMIN) tablet Take 1 tablet by mouth daily.     nystatin (MYCOSTATIN) 100000 UNIT/ML suspension Take 5 mLs (500,000 Units total) by mouth 4 (four) times daily. 60 mL 0   oxybutynin (DITROPAN) 5 MG tablet Take 5 mg by mouth daily.     potassium chloride SA (KLOR-CON M20) 20 MEQ tablet TAKE 1 TABLET BY MOUTH EVERY DAY 90 tablet 1   predniSONE (DELTASONE) 10 MG tablet Take by mouth.     sodium chloride flush 0.9 % SOLN injection 10 mLs by Intracatheter route 2 (two) times a week. Inject into both PICC line ports twice weekly 480 mL 2   vitamin B-12 (CYANOCOBALAMIN) 100 MCG tablet Take 100 mcg by mouth daily.     zolpidem (AMBIEN) 5 MG tablet Take 1 tablet (5 mg total) by mouth at bedtime as needed. for sleep 30 tablet 2   acetaminophen (TYLENOL) 500 MG tablet Take 500-1,000 mg by mouth every 6 (six) hours as needed for mild pain (or headaches). (Patient not taking: Reported on 06/16/2021)     No current facility-administered medications for this visit.    ALLERGIES:  Allergies  Allergen Reactions   Penicillin G Rash    PHYSICAL EXAM:  Performance status (ECOG): 2 - Symptomatic, <50% confined to bed  Vitals:   06/16/21 1106  BP: (!) 128/50  Pulse: 80  Resp: 17  Temp: (!) 96.8 F (36 C)  SpO2: 97%   Wt Readings from Last 3 Encounters:  06/16/21 144 lb 6.4 oz (65.5 kg)  05/03/21 144 lb 13.5 oz (65.7 kg)  03/25/21 139 lb 6.4 oz (63.2 kg)   Physical Exam Vitals reviewed.  Constitutional:      Appearance: Normal appearance.  Cardiovascular:     Rate and Rhythm: Normal rate and regular rhythm.     Pulses: Normal  pulses.     Heart sounds: Normal heart sounds.  Pulmonary:     Effort: Pulmonary effort is normal.     Breath sounds: Normal breath sounds.  Abdominal:     Palpations: Abdomen is soft. There is no hepatomegaly, splenomegaly or mass.     Tenderness: There is no abdominal tenderness.  Musculoskeletal:     Right lower leg: No edema.     Left lower leg: No edema.  Neurological:     General: No focal deficit present.  Mental Status: He is alert and oriented to person, place, and time.  Psychiatric:        Mood and Affect: Mood normal.        Behavior: Behavior normal.     LABORATORY DATA:  I have reviewed the labs as listed.  CBC Latest Ref Rng & Units 06/16/2021 04/29/2021 03/25/2021  WBC 4.0 - 10.5 K/uL 7.1 3.8(L) 5.2  Hemoglobin 13.0 - 17.0 g/dL 12.7(L) 11.7(L) 11.4(L)  Hematocrit 39.0 - 52.0 % 39.4 35.8(L) 33.8(L)  Platelets 150 - 400 K/uL 121(L) 116(L) 145(L)   CMP Latest Ref Rng & Units 06/16/2021 04/29/2021 03/25/2021  Glucose 70 - 99 mg/dL 122(H) 122(H) 117(H)  BUN 8 - 23 mg/dL 25(H) 17 17  Creatinine 0.61 - 1.24 mg/dL 0.73 0.69 0.69  Sodium 135 - 145 mmol/L 139 139 139  Potassium 3.5 - 5.1 mmol/L 3.7 3.7 3.3(L)  Chloride 98 - 111 mmol/L 104 106 109  CO2 22 - 32 mmol/L 27 28 25   Calcium 8.9 - 10.3 mg/dL 9.6 9.8 9.7  Total Protein 6.5 - 8.1 g/dL 6.8 6.3(L) 6.4(L)  Total Bilirubin 0.3 - 1.2 mg/dL 0.9 1.0 0.7  Alkaline Phos 38 - 126 U/L 85 86 79  AST 15 - 41 U/L 37 46(H) 32  ALT 0 - 44 U/L 44 38 24    DIAGNOSTIC IMAGING:  I have independently reviewed the scans and discussed with the patient. No results found.   ASSESSMENT:  1.  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  Perirenal spaces with periaortic/retroperitoneal adenopathy, nodular thickening of the adrenal glands.  Ascites and edematous changes in the mesentery with cirrhotic features, portal enteropathy. - Presentation with 30 pound weight  loss in the last 6 months. 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  Ki-67 95%.  Positive CD20, BCL6, CD5 and Bcl-2. - FISH positive for BCL6 and MYC rearrangement. - 6 cycles of R mini CHOP from 12/11/2020 through 03/25/2021.  2.  Stage III (ypT4, N2) rectal cancer: - Presentation in November 2016 with mass at 10 cm from the anus, clinical stage II by EUS-T3N0 - Neoadjuvant Xeloda and radiation from 06/01/2015, completed 20/28 planned fractions, discontinued secondary to toxicity. - Low anterior resection in September 2017, ypT4a, N2  3.  Prostate cancer: - Treated with prostatectomy 1990.  4.  Social/family history: - He lives at home with his wife of 12 years.  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  No family history of malignancies.   PLAN:  1.  Double hit lymphoma: - He has completed 6 cycles of chemotherapy on 03/25/2021. - Reviewed PET scan from 04/29/2021 which did not show any evidence of hypermetabolic activity.  [place] 1.  Normal adrenal glands. - Reviewed labs from today which showed normal LDH.  CBC shows improving white count and platelet count.  LFTs are normal. - His functional status is also improving.  Walking at home without cane. - RTC 3 months with repeat labs.  We will consider repeating imaging in 6 months.  2.  Weight loss: - He is eating well and gaining weight.  Not on any appetite stimulant.  3.  Atrial fibrillation: - Continue Eliquis.  No bleeding issues.    Orders placed this encounter:  Orders Placed This Encounter  Procedures   CBC with Differential   Comprehensive metabolic panel   Lactate dehydrogenase     Derek Jack,  MD [place] [phone]   I, Thana Ates, am acting as a scribe for Dr. Derek Jack.  I, Derek Jack MD, have reviewed the above documentation for accuracy and completeness, and I agree with the above.

## 2021-06-16 NOTE — Patient Instructions (Signed)
[place] at [facility] Discharge Instructions   You were seen and examined today by Dr. Delton Coombes.  He reviewed the results of your lab work and scans - all results are normal/stable.   We will see you back in 3 months for repeat blood work and office visit.    Thank you for choosing [place] at [facility] to provide your oncology and hematology care.  To afford each patient quality time with our provider, please arrive at least 15 minutes before your scheduled appointment time.   If you have a lab appointment with the [facility] please come in thru the Main Entrance and check in at the main information desk.  You need to re-schedule your appointment should you arrive 10 or more minutes late.  We strive to give you quality time with our providers, and arriving late affects you and other patients whose appointments are after yours.  Also, if you no show three or more times for appointments you may be dismissed from the clinic at the providers discretion.     Again, thank you for choosing [facility].  Our hope is that these requests will decrease the amount of time that you wait before being seen by our physicians.       _____________________________________________________________  Should you have questions after your visit to [facility], please contact our office at [phone] and follow the prompts.  Our office hours are 8:00 a.m. and 4:30 p.m. Monday - Friday.  Please note that voicemails left after 4:00 p.m. may not be returned until the following business day.  We are closed weekends and major holidays.  You do have access to a nurse 24-7, just call the main number to the clinic [phone] and do not press any options, hold on the line and a nurse will answer the phone.    For prescription refill requests, have your pharmacy contact our office and allow 72 hours.    Due to Covid, you will need to wear  a mask upon entering the hospital. If you do not have a mask, a mask will be given to you at the Main Entrance upon arrival. For doctor visits, patients may have 1 support person age 39 or older with them. For treatment visits, patients can not have anyone with them due to social distancing guidelines and our immunocompromised population.

## 2021-07-06 ENCOUNTER — Telehealth (HOSPITAL_COMMUNITY): Payer: Self-pay | Admitting: *Deleted

## 2021-07-06 NOTE — Telephone Encounter (Signed)
Wife called to advise that patient is experiencing constipation x 1 day and has a colostomy.  Advised to take colace 2 caps bid and drink plenty of water and make [place] aware if he has no results within 24-48 hours.  Verbalized understanding.

## 2021-08-04 ENCOUNTER — Other Ambulatory Visit (HOSPITAL_COMMUNITY): Payer: Self-pay | Admitting: Hematology

## 2021-09-14 ENCOUNTER — Inpatient Hospital Stay (HOSPITAL_COMMUNITY): Payer: Medicare Other

## 2021-09-14 ENCOUNTER — Inpatient Hospital Stay (HOSPITAL_COMMUNITY): Payer: Medicare Other | Attending: Hematology | Admitting: Hematology

## 2021-09-14 VITALS — BP 127/55 | HR 74 | Temp 97.3°F | Resp 18 | Ht 66.93 in | Wt 158.7 lb

## 2021-09-14 DIAGNOSIS — C8593 Non-Hodgkin lymphoma, unspecified, intra-abdominal lymph nodes: Secondary | ICD-10-CM | POA: Diagnosis not present

## 2021-09-14 DIAGNOSIS — C8513 Unspecified B-cell lymphoma, intra-abdominal lymph nodes: Secondary | ICD-10-CM

## 2021-09-14 DIAGNOSIS — Z8546 Personal history of malignant neoplasm of prostate: Secondary | ICD-10-CM | POA: Insufficient documentation

## 2021-09-14 DIAGNOSIS — Z85048 Personal history of other malignant neoplasm of rectum, rectosigmoid junction, and anus: Secondary | ICD-10-CM | POA: Insufficient documentation

## 2021-09-14 DIAGNOSIS — Z87891 Personal history of nicotine dependence: Secondary | ICD-10-CM | POA: Insufficient documentation

## 2021-09-14 DIAGNOSIS — C20 Malignant neoplasm of rectum: Secondary | ICD-10-CM

## 2021-09-14 DIAGNOSIS — D696 Thrombocytopenia, unspecified: Secondary | ICD-10-CM | POA: Diagnosis not present

## 2021-09-14 DIAGNOSIS — C8590 Non-Hodgkin lymphoma, unspecified, unspecified site: Secondary | ICD-10-CM | POA: Insufficient documentation

## 2021-09-14 LAB — CBC WITH DIFFERENTIAL/PLATELET
Abs Immature Granulocytes: 0.01 10*3/uL (ref 0.00–0.07)
Basophils Absolute: 0.1 10*3/uL (ref 0.0–0.1)
Basophils Relative: 1 %
Eosinophils Absolute: 0.1 10*3/uL (ref 0.0–0.5)
Eosinophils Relative: 3 %
HCT: 37.9 % — ABNORMAL LOW (ref 39.0–52.0)
Hemoglobin: 12.5 g/dL — ABNORMAL LOW (ref 13.0–17.0)
Immature Granulocytes: 0 %
Lymphocytes Relative: 21 %
Lymphs Abs: 0.8 10*3/uL (ref 0.7–4.0)
MCH: 33.7 pg (ref 26.0–34.0)
MCHC: 33 g/dL (ref 30.0–36.0)
MCV: 102.2 fL — ABNORMAL HIGH (ref 80.0–100.0)
Monocytes Absolute: 0.9 10*3/uL (ref 0.1–1.0)
Monocytes Relative: 22 %
Neutro Abs: 2.1 10*3/uL (ref 1.7–7.7)
Neutrophils Relative %: 53 %
Platelets: 124 10*3/uL — ABNORMAL LOW (ref 150–400)
RBC: 3.71 MIL/uL — ABNORMAL LOW (ref 4.22–5.81)
RDW: 14.9 % (ref 11.5–15.5)
WBC: 4.1 10*3/uL (ref 4.0–10.5)
nRBC: 0 % (ref 0.0–0.2)

## 2021-09-14 LAB — COMPREHENSIVE METABOLIC PANEL
ALT: 22 U/L (ref 0–44)
AST: 32 U/L (ref 15–41)
Albumin: 3.5 g/dL (ref 3.5–5.0)
Alkaline Phosphatase: 78 U/L (ref 38–126)
Anion gap: 5 (ref 5–15)
BUN: 16 mg/dL (ref 8–23)
CO2: 26 mmol/L (ref 22–32)
Calcium: 9.4 mg/dL (ref 8.9–10.3)
Chloride: 109 mmol/L (ref 98–111)
Creatinine, Ser: 0.73 mg/dL (ref 0.61–1.24)
GFR, Estimated: >60 mL/min (ref >60)
Glucose, Bld: 119 mg/dL — ABNORMAL HIGH (ref 70–99)
Potassium: 4 mmol/L (ref 3.5–5.1)
Sodium: 140 mmol/L (ref 135–145)
Total Bilirubin: 0.8 mg/dL (ref 0.3–1.2)
Total Protein: 6.9 g/dL (ref 6.5–8.1)

## 2021-09-14 LAB — LACTATE DEHYDROGENASE: LDH: 189 U/L (ref 98–192)

## 2021-09-14 NOTE — Patient Instructions (Addendum)
[place] at [facility] ?Discharge Instructions ? ? ?You were seen and examined today by Dr. Delton Coombes. ? ?He reviewed your lab work today which is normal/stable.  ? ?We will arrange for more physical therapy.  ? ?We will see you back in 3 months after repeat scans and labs.  ? ? ? ? ?Thank you for choosing [place] at [facility] to provide your oncology and hematology care.  To afford each patient quality time with our provider, please arrive at least 15 minutes before your scheduled appointment time.  ? ?If you have a lab appointment with the [facility] please come in thru the Main Entrance and check in at the main information desk. ? ?You need to re-schedule your appointment should you arrive 10 or more minutes late.  We strive to give you quality time with our providers, and arriving late affects you and other patients whose appointments are after yours.  Also, if you no show three or more times for appointments you may be dismissed from the clinic at the providers discretion.     ?Again, thank you for choosing [facility].  Our hope is that these requests will decrease the amount of time that you wait before being seen by our physicians.       ?_____________________________________________________________ ? ?Should you have questions after your visit to [facility], please contact our office at [phone] and follow the prompts.  Our office hours are 8:00 a.m. and 4:30 p.m. Monday - Friday.  Please note that voicemails left after 4:00 p.m. may not be returned until the following business day.  We are closed weekends and major holidays.  You do have access to a nurse 24-7, just call the main number to the clinic [phone] and do not press any options, hold on the line and a nurse will answer the phone.   ? ?For prescription refill requests, have your pharmacy contact our office and allow 72 hours.   ? ?Due to Covid, you will  need to wear a mask upon entering the hospital. If you do not have a mask, a mask will be given to you at the Main Entrance upon arrival. For doctor visits, patients may have 1 support person age 71 or older with them. For treatment visits, patients can not have anyone with them due to social distancing guidelines and our immunocompromised population.  ? ?   ?

## 2021-09-14 NOTE — Progress Notes (Signed)
Referral for PT sent to [facility] in [place], [place] to [phone].  Telephone number [phone]. ?

## 2021-09-14 NOTE — Progress Notes (Signed)
? ?[facility] ?[address] ?[place], [place] [address] ? ? ?CLINIC:  ?Medical Oncology/Hematology ? ?PCP:  ?[name], MD ?[address] [place] [address] ?[phone] ? ? ?REASON FOR VISIT:  ?Follow-up for Non-Hodgkin's lymphoma and history of rectal cancer ? ?PRIOR THERAPY: none ? ?NGS Results: not done ? ?CURRENT THERAPY: CVP + Rituximab every 3 weeks ? ?BRIEF ONCOLOGIC HISTORY:  ?Oncology History  ?Non-Hodgkin lymphoma ([place])  ?12/09/2020 Initial Diagnosis  ? Non-Hodgkin lymphoma ([place]) ? ?  ?12/11/2020 -  Chemotherapy  ? Patient is on Treatment Plan : NON-HODGKINS LYMPHOMA CVP + Rituximab q21d  ? ?  ?  ? ? ?CANCER STAGING: ? Cancer Staging  ?No matching staging information was found for the patient. ? ?INTERVAL HISTORY:  ?Cody Hurley, a 88 y.o. male, returns for routine follow-up of his Non-Hodgkin's lymphoma and history of rectal cancer. Cody Hurley was last seen on 06/16/2021.  ? ?Today he reports feeling good, and he is accompanied by his wife. He reports his energy is below his baseline. His wife reports he has been climbing ladders and only occasionally using the cane to walk although he remains unsteady while walking. He reports numbness in his hands. He has a history of carpal tunnel. He denies weight loss, and his appetite is good. He has gained 14 lbs since his last visit.  ? ?REVIEW OF SYSTEMS:  ?Review of Systems  ?Constitutional:  Positive for fatigue. Negative for appetite change and unexpected weight change.  ?Cardiovascular:  Positive for leg swelling.  ?Neurological:  Positive for dizziness (unsteady) and numbness (hands).  ?All other systems reviewed and are negative. ? ?PAST MEDICAL/SURGICAL HISTORY:  ?Past Medical History:  ?Diagnosis Date  ? Colorectal cancer ([place]) 2016  ? Prostate cancer ([place])   ? ?Past Surgical History:  ?Procedure Laterality Date  ? COLOSTOMY    ? MELANOMA EXCISION    ? ear  ? PROSTATE SURGERY  1991  ? ? ?SOCIAL HISTORY:  ?Social History   ? ?Socioeconomic History  ? Marital status: Married  ?  Spouse name: Not on file  ? Number of children: Not on file  ? Years of education: Not on file  ? Highest education level: Not on file  ?Occupational History  ? Not on file  ?Tobacco Use  ? Smoking status: Former  ?  Packs/day: 0.50  ?  Types: Cigarettes  ?  Quit date: 44  ?  Years since quitting: 75.3  ? Smokeless tobacco: Never  ?Vaping Use  ? Vaping Use: Never used  ?Substance and Sexual Activity  ? Alcohol use: Never  ? Drug use: Never  ? Sexual activity: Not on file  ?Other Topics Concern  ? Not on file  ?Social History Narrative  ? Not on file  ? ?Social Determinants of Health  ? ?Financial Resource Strain: Low Risk   ? Difficulty of Paying Living Expenses: Not very hard  ?Food Insecurity: No Food Insecurity  ? Worried About Charity fundraiser in the Last Year: Never true  ? Ran Out of Food in the Last Year: Never true  ?Transportation Needs: No Transportation Needs  ? Lack of Transportation (Medical): No  ? Lack of Transportation (Non-Medical): No  ?Physical Activity: Inactive  ? Days of Exercise per Week: 0 days  ? Minutes of Exercise per Session: 0 min  ?Stress: No Stress Concern Present  ? Feeling of Stress : Not at all  ?Social Connections: Moderately Integrated  ? Frequency of  Communication with Friends and Family: Three times a week  ? Frequency of Social Gatherings with Friends and Family: Three times a week  ? Attends Religious Services: 1 to 4 times per year  ? Active Member of Clubs or Organizations: No  ? Attends Archivist Meetings: Never  ? Marital Status: Married  ?Intimate Partner Violence: Not At Risk  ? Fear of Current or Ex-Partner: No  ? Emotionally Abused: No  ? Physically Abused: No  ? Sexually Abused: No  ? ? ?FAMILY HISTORY:  ?No family history on file. ? ?CURRENT MEDICATIONS:  ?Current Outpatient Medications  ?Medication Sig Dispense Refill  ? acetaminophen (TYLENOL) 500 MG tablet Take 500-1,000 mg by mouth every 6  (six) hours as needed for mild pain (or headaches).    ? azaTHIOprine (IMURAN) 50 MG tablet Take 25 mg by mouth daily.    ? Cholecalciferol (VITAMIN D-3) 25 MCG (1000 UT) CAPS Take 2,000 Units by mouth daily with breakfast.    ? docusate sodium (COLACE) 100 MG capsule Take 100 mg by mouth 2 (two) times daily. May take 2 twice a day if needed    ? ELIQUIS 2.5 MG TABS tablet TAKE 1 TABLET BY MOUTH TWICE A DAY 60 tablet 4  ? feeding supplement (ENSURE ENLIVE / ENSURE PLUS) LIQD Take 237 mLs by mouth 2 (two) times daily between meals. 237 mL 12  ? heparin lock flush 100 UNIT/ML SOLN injection 2.5 mLs (250 Units total) by Intracatheter route 2 (two) times a week. Inject 2.5 ml to each lumen of PICC line twice weekly after NS flush 120 mL 2  ? Multiple Vitamin (MULTIVITAMIN) tablet Take 1 tablet by mouth daily.    ? nystatin (MYCOSTATIN) 100000 UNIT/ML suspension Take 5 mLs (500,000 Units total) by mouth 4 (four) times daily. 60 mL 0  ? oxybutynin (DITROPAN) 5 MG tablet Take 5 mg by mouth daily.    ? potassium chloride SA (KLOR-CON M20) 20 MEQ tablet TAKE 1 TABLET BY MOUTH EVERY DAY 90 tablet 1  ? predniSONE (DELTASONE) 10 MG tablet Take by mouth.    ? sodium chloride flush 0.9 % SOLN injection 10 mLs by Intracatheter route 2 (two) times a week. Inject into both PICC line ports twice weekly 480 mL 2  ? vitamin B-12 (CYANOCOBALAMIN) 100 MCG tablet Take 100 mcg by mouth daily.    ? zolpidem (AMBIEN) 5 MG tablet Take 1 tablet (5 mg total) by mouth at bedtime as needed. for sleep 30 tablet 2  ? ?No current facility-administered medications for this visit.  ? ? ?ALLERGIES:  ?Allergies  ?Allergen Reactions  ? Penicillin G Rash  ? ? ?PHYSICAL EXAM:  ?Performance status (ECOG): 2 - Symptomatic, <50% confined to bed ? ?Vitals:  ? 09/14/21 1146  ?BP: (!) 127/55  ?Pulse: 74  ?Resp: 18  ?Temp: (!) 97.3 ?F (36.3 ?C)  ?SpO2: 100%  ? ?Wt Readings from Last 3 Encounters:  ?09/14/21 158 lb 11.7 oz (72 kg)  ?06/16/21 144 lb 6.4 oz (65.5  kg)  ?05/03/21 144 lb 13.5 oz (65.7 kg)  ? ?Physical Exam ?Vitals reviewed.  ?Constitutional:   ?   Appearance: Normal appearance.  ?   Comments: In wheelchair  ?Cardiovascular:  ?   Rate and Rhythm: Normal rate and regular rhythm.  ?   Pulses: Normal pulses.  ?   Heart sounds: Normal heart sounds.  ?Pulmonary:  ?   Effort: Pulmonary effort is normal.  ?   Breath sounds: Normal  breath sounds.  ?Abdominal:  ?   Palpations: Abdomen is soft. There is no hepatomegaly, splenomegaly or mass.  ?   Tenderness: There is no abdominal tenderness.  ?Musculoskeletal:  ?   Right lower leg: 1+ Edema present.  ?   Left lower leg: 1+ Edema present.  ?Lymphadenopathy:  ?   Cervical: No cervical adenopathy.  ?   Right cervical: No superficial cervical adenopathy. ?   Left cervical: No superficial cervical adenopathy.  ?   Upper Body:  ?   Right upper body: No supraclavicular or axillary adenopathy.  ?   Left upper body: No supraclavicular or axillary adenopathy.  ?Neurological:  ?   General: No focal deficit present.  ?   Mental Status: He is alert and oriented to person, place, and time.  ?Psychiatric:     ?   Mood and Affect: Mood normal.     ?   Behavior: Behavior normal.  ?  ? ?LABORATORY DATA:  ?I have reviewed the labs as listed.  ? ?  Latest Ref Rng & Units 09/14/2021  ? 11:09 AM 06/16/2021  ? 10:09 AM 04/29/2021  ?  8:40 AM  ?CBC  ?WBC 4.0 - 10.5 K/uL 4.1   7.1   3.8    ?Hemoglobin 13.0 - 17.0 g/dL 12.5   12.7   11.7    ?Hematocrit 39.0 - 52.0 % 37.9   39.4   35.8    ?Platelets 150 - 400 K/uL 124   121   116    ? ? ?  Latest Ref Rng & Units 09/14/2021  ? 11:09 AM 06/16/2021  ? 10:09 AM 04/29/2021  ?  8:40 AM  ?CMP  ?Glucose 70 - 99 mg/dL 119   122   122    ?BUN 8 - 23 mg/dL '16   25   17    '$ ?Creatinine 0.61 - 1.24 mg/dL 0.73   0.73   0.69    ?Sodium 135 - 145 mmol/L 140   139   139    ?Potassium 3.5 - 5.1 mmol/L 4.0   3.7   3.7    ?Chloride 98 - 111 mmol/L 109   104   106    ?CO2 22 - 32 mmol/L '26   27   28    '$ ?Calcium 8.9 - 10.3  mg/dL 9.4   9.6   9.8    ?Total Protein 6.5 - 8.1 g/dL 6.9   6.8   6.3    ?Total Bilirubin 0.3 - 1.2 mg/dL 0.8   0.9   1.0    ?Alkaline Phos 38 - 126 U/L 78   85   86    ?AST 15 - 41 U/L 32   37   46    ?ALT 0 - 44 U/L 2

## 2021-09-24 ENCOUNTER — Encounter (HOSPITAL_COMMUNITY): Payer: Self-pay

## 2021-09-24 NOTE — Progress Notes (Signed)
Wife contacted our office this morning regarding patient's PT referral.  Referral was sent by Renda Rolls, RN on 09/14/21 to [facility].  Wife had not heard from them.  I contacted [place] to check that status of the referral and she informed me that his insurance was not in network with [facility].  She suggested [facility] as they participated with his insurance.  Wife informed and was agreeable.  Referral faxed to [facility] today.  Fax: [phone] Phone: [phone] ?

## 2021-09-27 ENCOUNTER — Telehealth (HOSPITAL_COMMUNITY): Payer: Self-pay | Admitting: *Deleted

## 2021-09-27 NOTE — Telephone Encounter (Signed)
Call received from [place] at [facility] to advise that they have accepted him as a patient and will make initial visit today or tomorrow.  Callback number is [phone]. ?

## 2021-10-04 ENCOUNTER — Encounter (HOSPITAL_COMMUNITY): Payer: Self-pay | Admitting: *Deleted

## 2021-10-04 ENCOUNTER — Emergency Department (HOSPITAL_COMMUNITY): Payer: Medicare Other

## 2021-10-04 ENCOUNTER — Other Ambulatory Visit: Payer: Self-pay

## 2021-10-04 ENCOUNTER — Emergency Department (HOSPITAL_COMMUNITY)
Admission: EM | Admit: 2021-10-04 | Discharge: 2021-10-04 | Disposition: A | Discharge status: Home / Self Care | Payer: Medicare Other | Attending: Emergency Medicine | Admitting: Emergency Medicine

## 2021-10-04 DIAGNOSIS — Z85038 Personal history of other malignant neoplasm of large intestine: Secondary | ICD-10-CM | POA: Insufficient documentation

## 2021-10-04 DIAGNOSIS — R140 Abdominal distension (gaseous): Secondary | ICD-10-CM | POA: Insufficient documentation

## 2021-10-04 DIAGNOSIS — Z7901 Long term (current) use of anticoagulants: Secondary | ICD-10-CM | POA: Insufficient documentation

## 2021-10-04 DIAGNOSIS — K5900 Constipation, unspecified: Secondary | ICD-10-CM | POA: Insufficient documentation

## 2021-10-04 DIAGNOSIS — R630 Anorexia: Secondary | ICD-10-CM | POA: Diagnosis not present

## 2021-10-04 DIAGNOSIS — I4891 Unspecified atrial fibrillation: Secondary | ICD-10-CM | POA: Insufficient documentation

## 2021-10-04 LAB — CBC WITH DIFFERENTIAL/PLATELET
Abs Immature Granulocytes: 0.02 10*3/uL (ref 0.00–0.07)
Basophils Absolute: 0 10*3/uL (ref 0.0–0.1)
Basophils Relative: 1 %
Eosinophils Absolute: 0.1 10*3/uL (ref 0.0–0.5)
Eosinophils Relative: 2 %
HCT: 38.3 % — ABNORMAL LOW (ref 39.0–52.0)
Hemoglobin: 12.3 g/dL — ABNORMAL LOW (ref 13.0–17.0)
Immature Granulocytes: 1 %
Lymphocytes Relative: 24 %
Lymphs Abs: 1 10*3/uL (ref 0.7–4.0)
MCH: 32.5 pg (ref 26.0–34.0)
MCHC: 32.1 g/dL (ref 30.0–36.0)
MCV: 101.3 fL — ABNORMAL HIGH (ref 80.0–100.0)
Monocytes Absolute: 0.8 10*3/uL (ref 0.1–1.0)
Monocytes Relative: 19 %
Neutro Abs: 2.2 10*3/uL (ref 1.7–7.7)
Neutrophils Relative %: 53 %
Platelets: 108 10*3/uL — ABNORMAL LOW (ref 150–400)
RBC: 3.78 MIL/uL — ABNORMAL LOW (ref 4.22–5.81)
RDW: 14.5 % (ref 11.5–15.5)
WBC: 4 10*3/uL (ref 4.0–10.5)
nRBC: 0 % (ref 0.0–0.2)

## 2021-10-04 LAB — COMPREHENSIVE METABOLIC PANEL
ALT: 27 U/L (ref 0–44)
AST: 36 U/L (ref 15–41)
Albumin: 3.4 g/dL — ABNORMAL LOW (ref 3.5–5.0)
Alkaline Phosphatase: 81 U/L (ref 38–126)
Anion gap: 4 — ABNORMAL LOW (ref 5–15)
BUN: 16 mg/dL (ref 8–23)
CO2: 28 mmol/L (ref 22–32)
Calcium: 9.9 mg/dL (ref 8.9–10.3)
Chloride: 109 mmol/L (ref 98–111)
Creatinine, Ser: 0.81 mg/dL (ref 0.61–1.24)
GFR, Estimated: >60 mL/min (ref >60)
Glucose, Bld: 128 mg/dL — ABNORMAL HIGH (ref 70–99)
Potassium: 3.9 mmol/L (ref 3.5–5.1)
Sodium: 141 mmol/L (ref 135–145)
Total Bilirubin: 0.6 mg/dL (ref 0.3–1.2)
Total Protein: 6.9 g/dL (ref 6.5–8.1)

## 2021-10-04 MED ORDER — IOHEXOL 300 MG/ML  SOLN
100.0000 mL | Freq: Once | INTRAMUSCULAR | Status: AC | PRN
  Administered 2021-10-04: 100 mL via INTRAVENOUS

## 2021-10-04 NOTE — ED Triage Notes (Signed)
Pt states he has not had a BM in 5 days; pt has used laxatives with no results ? ?

## 2021-10-04 NOTE — Discharge Instructions (Addendum)
Lab work imaging were all reassuring.  I suspect he is constipated because he does not drink enough fluids, please remember to keep him hydrated please drink plenty of water.  I want you to start giving him 1 capful MiraLAX mixed in water 2 times daily for the next 4 days. ? ?Please come back to the emergency department if patient starts developing stomach pain, has  nausea vomiting fevers chills or is unable have a bowel movement despite taking MiraLAX 2 times daily after 3 days time. ?

## 2021-10-04 NOTE — ED Provider Notes (Signed)
?  Face-to-face evaluation ? ? ?History: He is here for evaluation of decreased appetite for about a week with decreased stooling for 4 days.  He has an ostomy.  He has a known hernia, associated with the ostomy according to his wife.  There is been no fever, cough, focal weakness or reported numbness.  He is typically able to manage himself walking but now needs more assistance than usual. ? ?Physical exam: Elderly, alert and cooperative.  Abdomen is moderately tender, without focality.  No stool in ostomy bag. ? ?MDM: Evaluation for  ?Chief Complaint  ?Patient presents with  ? Constipation  ?  ? ?Patient with decreased stooling, after a period of decreased appetite.  Nonspecific findings, requiring evaluation for structural abnormality as well as acute illness causing malaise and decreased appetite. ? ?Medical screening examination/treatment/procedure(s) were conducted as a shared visit with non-physician practitioner(s) and myself.  I personally evaluated the patient during the encounter ? ?  ?Daleen Bo, MD ?10/05/21 1425 ? ?

## 2021-10-04 NOTE — ED Provider Notes (Signed)
?[facility] ?Provider Note ? ? ?CSN: [id number] ?Arrival date & time: 10/04/21  9798 ? ?  ? ?History ? ?Chief Complaint  ?Patient presents with  ? Constipation  ? ? ?Cody Hurley is a 88 y.o. male. ? ?HPI ? ?Patient with history including A-fib currently on Eliquis, lymphoma currently not on chemo/radiation therapy, rectalCarcinoma status post colostomy presents with complaints of difficulty with bowel movements.  Patient states that he has been unable to have a bowel movement the last 4 days, has a colostomy which he received back in 2017, denies any stool in the bag.  He denies any pain stomach, swelling or inflammation around the colostomy itself, denies nausea vomiting, states he has had decrease in appetite but is having no other complaints.  He has tried laxatives without much relief.  He has no other complaints. ? ?Wife at bedside able to validate the story. ? ?I have reviewed patient's charts patient had a Hartman's procedure where he had his rectum and distal end of his colon removed and a colostomy was placed in 2017 at [facility], he is currently being treated for lymphoma by Dr. Delton Coombes, he is status post chemo/radiation therapy at this time. ? ?Home Medications ?Prior to Admission medications   ?Medication Sig Start Date End Date Taking? Authorizing Provider  ?acetaminophen (TYLENOL) 500 MG tablet Take 500-1,000 mg by mouth every 6 (six) hours as needed for mild pain (or headaches).    [provider]  ?azaTHIOprine (IMURAN) 50 MG tablet Take 25 mg by mouth daily. 07/13/21   [provider]  ?Cholecalciferol (VITAMIN D-3) 25 MCG (1000 UT) CAPS Take 2,000 Units by mouth daily with breakfast.    [provider]  ?docusate sodium (COLACE) 100 MG capsule Take 100 mg by mouth 2 (two) times daily. May take 2 twice a day if needed    [provider]  ?ELIQUIS 2.5 MG TABS tablet TAKE 1 TABLET BY MOUTH TWICE A DAY 08/04/21   Derek Jack, MD   ?feeding supplement (ENSURE ENLIVE / ENSURE PLUS) LIQD Take 237 mLs by mouth 2 (two) times daily between meals. 12/19/20   Annita Brod, MD  ?heparin lock flush 100 UNIT/ML SOLN injection 2.5 mLs (250 Units total) by Intracatheter route 2 (two) times a week. Inject 2.5 ml to each lumen of PICC line twice weekly after NS flush 12/24/20   Ladell Pier, MD  ?Multiple Vitamin (MULTIVITAMIN) tablet Take 1 tablet by mouth daily.    [provider]  ?nystatin (MYCOSTATIN) 100000 UNIT/ML suspension Take 5 mLs (500,000 Units total) by mouth 4 (four) times daily. 01/14/21   Derek Jack, MD  ?oxybutynin (DITROPAN) 5 MG tablet Take 5 mg by mouth daily. 09/18/20   [provider]  ?potassium chloride SA (KLOR-CON M20) 20 MEQ tablet TAKE 1 TABLET BY MOUTH EVERY DAY 05/11/21   Derek Jack, MD  ?predniSONE (DELTASONE) 10 MG tablet Take by mouth. 06/01/21   [provider]  ?sodium chloride flush 0.9 % SOLN injection 10 mLs by Intracatheter route 2 (two) times a week. Inject into both PICC line ports twice weekly 12/24/20   Ladell Pier, MD  ?vitamin B-12 (CYANOCOBALAMIN) 100 MCG tablet Take 100 mcg by mouth daily. 09/18/20   [provider]  ?zolpidem (AMBIEN) 5 MG tablet Take 1 tablet (5 mg total) by mouth at bedtime as needed. for sleep 03/25/21   Derek Jack, MD  ?   ? ?Allergies    ?Penicillin g   ? ?  Review of Systems   ?Review of Systems  ?Constitutional:  Negative for chills and fever.  ?Respiratory:  Negative for shortness of breath.   ?Cardiovascular:  Negative for chest pain.  ?Gastrointestinal:  Positive for constipation. Negative for abdominal pain, nausea and vomiting.  ?Neurological:  Negative for headaches.  ? ?Physical Exam ?Updated Vital Signs ?BP 121/61   Pulse 75   Temp 97.6 ?F (36.4 ?C) (Oral)   Resp 19   Ht 5' 6.93" (1.7 m)   Wt 72 kg   SpO2 100%   BMI 24.91 kg/m?  ?Physical Exam ?Vitals and nursing note reviewed.  ?Constitutional:   ?    General: He is not in acute distress. ?   Appearance: He is not ill-appearing.  ?HENT:  ?   Head: Normocephalic and atraumatic.  ?   Nose: No congestion.  ?Eyes:  ?   Conjunctiva/sclera: Conjunctivae normal.  ?Cardiovascular:  ?   Rate and Rhythm: Normal rate and regular rhythm.  ?   Pulses: Normal pulses.  ?   Heart sounds: No murmur heard. ?  No friction rub. No gallop.  ?Pulmonary:  ?   Effort: No respiratory distress.  ?   Breath sounds: No wheezing, rhonchi or rales.  ?Abdominal:  ?   General: There is distension.  ?   Palpations: Abdomen is soft.  ?   Tenderness: There is no abdominal tenderness. There is no right CVA tenderness or left CVA tenderness.  ?   Comments: Has a colostomy left upper quadrant, no surrounding erythema, stoma appears to be intact no evidence of infection, no stool within the bag, abdomen was slightly distended but nontender my exam, there is no guarding rebound tenderness or peritoneal sign.  ?Musculoskeletal:  ?   Right lower leg: No edema.  ?   Left lower leg: No edema.  ?Skin: ?   General: Skin is warm and dry.  ?Neurological:  ?   Mental Status: He is alert.  ?Psychiatric:     ?   Mood and Affect: Mood normal.  ? ? ?ED Results / Procedures / Treatments   ?Labs ?(all labs ordered are listed, but only abnormal results are displayed) ?Labs Reviewed  ?COMPREHENSIVE METABOLIC PANEL - Abnormal; Notable for the following components:  ?    Result Value  ? Glucose, Bld 128 (*)   ? Albumin 3.4 (*)   ? Anion gap 4 (*)   ? All other components within normal limits  ?CBC WITH DIFFERENTIAL/PLATELET - Abnormal; Notable for the following components:  ? RBC 3.78 (*)   ? Hemoglobin 12.3 (*)   ? HCT 38.3 (*)   ? MCV 101.3 (*)   ? Platelets 108 (*)   ? All other components within normal limits  ? ? ?EKG ?EKG Interpretation ? ?Date/Time:  Monday Oct 04 2021 11:25:11 EDT ?Ventricular Rate:  69 ?PR Interval:  200 ?QRS Duration: 98 ?QT Interval:  395 ?QTC Calculation: 424 ?R Axis:   11 ?Text  Interpretation: Sinus rhythm Ventricular premature complex Anteroseptal infarct, old No old tracing to compare Confirmed by Daleen Bo [phone]) on 10/04/2021 11:27:33 AM ? ?Radiology ?CT ABDOMEN PELVIS W CONTRAST ? ?Result Date: 10/04/2021 ?CLINICAL DATA:  Five-day history of no bowel movement. Bowel obstruction suspected. History of colon cancer. EXAM: CT ABDOMEN AND PELVIS WITH CONTRAST TECHNIQUE: Multidetector CT imaging of the abdomen and pelvis was performed using the standard protocol following bolus administration of intravenous contrast. RADIATION DOSE REDUCTION: This exam was performed according to the departmental  dose-optimization program which includes automated exposure control, adjustment of the mA and/or kV according to patient size and/or use of iterative reconstruction technique. CONTRAST:  132m OMNIPAQUE IOHEXOL 300 MG/ML  SOLN COMPARISON:  PET-CT 04/29/2021 FINDINGS: Lower chest: Tiny hiatal hernia with paraesophageal varices evident. Hepatobiliary: Nodular liver contour is compatible with cirrhosis. Numerous calcified gallstones evident measuring up to the 8-9 mm size range. No intrahepatic or extrahepatic biliary dilation. Pancreas: No focal mass lesion. No dilatation of the main duct. No intraparenchymal cyst. No peripancreatic edema. Spleen: No splenomegaly. No focal mass lesion. Adrenals/Urinary Tract: No adrenal nodule or mass. Kidneys unremarkable. No evidence for hydroureter. The urinary bladder appears normal for the degree of distention. Stomach/Bowel: Stomach is unremarkable. No gastric wall thickening. No evidence of outlet obstruction. Duodenum is normally positioned as is the ligament of Treitz. No small bowel wall thickening. No small bowel dilatation. The terminal ileum is normal. The appendix is normal. Diverticuli are seen scattered along the entire length of the colon without CT findings of diverticulitis. Moderate stool volume in the right colon although splenic flexure and  descending colon proximal to the stoma show only minimal stool. Short rectal stump noted with abnormal presacral soft tissue similar to prior, likely related to treatment Vascular/Lymphatic: There is moderate atherosclerotic cal

## 2021-10-07 ENCOUNTER — Emergency Department (HOSPITAL_COMMUNITY): Payer: Medicare Other

## 2021-10-07 ENCOUNTER — Other Ambulatory Visit: Payer: Self-pay

## 2021-10-07 ENCOUNTER — Emergency Department (HOSPITAL_COMMUNITY)
Admission: EM | Admit: 2021-10-07 | Discharge: 2021-10-08 | Disposition: A | Discharge status: Home / Self Care | Payer: Medicare Other | Attending: Emergency Medicine | Admitting: Emergency Medicine

## 2021-10-07 ENCOUNTER — Encounter (HOSPITAL_COMMUNITY): Payer: Self-pay | Admitting: *Deleted

## 2021-10-07 DIAGNOSIS — Z8546 Personal history of malignant neoplasm of prostate: Secondary | ICD-10-CM | POA: Diagnosis not present

## 2021-10-07 DIAGNOSIS — Z7901 Long term (current) use of anticoagulants: Secondary | ICD-10-CM | POA: Diagnosis not present

## 2021-10-07 DIAGNOSIS — K5900 Constipation, unspecified: Secondary | ICD-10-CM | POA: Insufficient documentation

## 2021-10-07 DIAGNOSIS — Z85048 Personal history of other malignant neoplasm of rectum, rectosigmoid junction, and anus: Secondary | ICD-10-CM | POA: Diagnosis not present

## 2021-10-07 LAB — CBC WITH DIFFERENTIAL/PLATELET
Abs Immature Granulocytes: 0.04 10*3/uL (ref 0.00–0.07)
Basophils Absolute: 0 10*3/uL (ref 0.0–0.1)
Basophils Relative: 1 %
Eosinophils Absolute: 0.1 10*3/uL (ref 0.0–0.5)
Eosinophils Relative: 4 %
HCT: 39.2 % (ref 39.0–52.0)
Hemoglobin: 13.2 g/dL (ref 13.0–17.0)
Immature Granulocytes: 1 %
Lymphocytes Relative: 24 %
Lymphs Abs: 1 10*3/uL (ref 0.7–4.0)
MCH: 33.5 pg (ref 26.0–34.0)
MCHC: 33.7 g/dL (ref 30.0–36.0)
MCV: 99.5 fL (ref 80.0–100.0)
Monocytes Absolute: 0.9 10*3/uL (ref 0.1–1.0)
Monocytes Relative: 22 %
Neutro Abs: 1.9 10*3/uL (ref 1.7–7.7)
Neutrophils Relative %: 48 %
Platelets: 112 10*3/uL — ABNORMAL LOW (ref 150–400)
RBC: 3.94 MIL/uL — ABNORMAL LOW (ref 4.22–5.81)
RDW: 14.4 % (ref 11.5–15.5)
WBC: 4 10*3/uL (ref 4.0–10.5)
nRBC: 0 % (ref 0.0–0.2)

## 2021-10-07 LAB — COMPREHENSIVE METABOLIC PANEL
ALT: 29 U/L (ref 0–44)
AST: 39 U/L (ref 15–41)
Albumin: 3.8 g/dL (ref 3.5–5.0)
Alkaline Phosphatase: 81 U/L (ref 38–126)
Anion gap: 6 (ref 5–15)
BUN: 16 mg/dL (ref 8–23)
CO2: 29 mmol/L (ref 22–32)
Calcium: 10 mg/dL (ref 8.9–10.3)
Chloride: 105 mmol/L (ref 98–111)
Creatinine, Ser: 0.89 mg/dL (ref 0.61–1.24)
GFR, Estimated: >60 mL/min (ref >60)
Glucose, Bld: 95 mg/dL (ref 70–99)
Potassium: 4 mmol/L (ref 3.5–5.1)
Sodium: 140 mmol/L (ref 135–145)
Total Bilirubin: 1 mg/dL (ref 0.3–1.2)
Total Protein: 7.2 g/dL (ref 6.5–8.1)

## 2021-10-07 LAB — POC OCCULT BLOOD, ED: Fecal Occult Bld: NEGATIVE

## 2021-10-07 LAB — URINALYSIS, ROUTINE W REFLEX MICROSCOPIC
Bilirubin Urine: NEGATIVE
Glucose, UA: NEGATIVE mg/dL
Hgb urine dipstick: NEGATIVE
Ketones, ur: NEGATIVE mg/dL
Leukocytes,Ua: NEGATIVE
Nitrite: NEGATIVE
Protein, ur: NEGATIVE mg/dL
Specific Gravity, Urine: 1.006 (ref 1.005–1.030)
pH: 9 — ABNORMAL HIGH (ref 5.0–8.0)

## 2021-10-07 LAB — LIPASE, BLOOD: Lipase: 43 U/L (ref 11–51)

## 2021-10-07 MED ORDER — IOHEXOL 300 MG/ML  SOLN
100.0000 mL | Freq: Once | INTRAMUSCULAR | Status: AC | PRN
Start: 2021-10-07 — End: 2021-10-07
  Administered 2021-10-07: 100 mL via INTRAVENOUS

## 2021-10-07 NOTE — ED Provider Notes (Signed)
?[facility] ?Provider Note ? ? ?CSN: [id number] ?Arrival date & time: 10/07/21  0854 ? ?  ? ?History ? ?Chief Complaint  ?Patient presents with  ? Constipation  ? ? ?Cody Hurley is a 88 y.o. male. ? ?HPI ? ?Patient with medical history including A-fib currently on Eliquis, lymphoma currently not on chemo/radiation therapy colorectal cancer status post colonoscopy presents with complaints of constipation.  Patient is a poor historian and answers that he is not in any pain at this time, states that he has been having some liquid stools but cannot tell me how much. ? ?Wife is at bedside and states that patient has been constipated for the last couple days, they were instructed after discharge from the ER to start MiraLAX, she states since then the patient's been having liquidy stools, she is unsure how many times they have had to change the colostomy bag as typically the patient does it himself.  She states that she is concerned as he has not had a solid bowel movement, she states that he does not endorse any stomach pains nausea or vomiting, she does state that he has had a decreased appetite starting today, he would not eat anything this morning, she states that he seems slightly more altered than usual but is not having other complaints no recent falls or head trauma no other complaints. ? ?I reviewed patient's chart was seen 4 days ago, he had a benign physical exam, CT imaging was unremarkable lab work is unremarkable as told the start MiraLAX and come back if symptoms or not improving. ? ?Home Medications ?Prior to Admission medications   ?Medication Sig Start Date End Date Taking? Authorizing Provider  ?acetaminophen (TYLENOL) 500 MG tablet Take 500-1,000 mg by mouth every 6 (six) hours as needed for mild pain (or headaches).   Yes [provider]  ?Cholecalciferol (VITAMIN D-3) 25 MCG (1000 UT) CAPS Take 2,000 Units by mouth daily with breakfast.   Yes [provider]   ?ELIQUIS 2.5 MG TABS tablet TAKE 1 TABLET BY MOUTH TWICE A DAY 08/04/21  Yes Derek Jack, MD  ?magnesium hydroxide (MILK OF MAGNESIA) 400 MG/5ML suspension Take 30 mLs by mouth daily as needed for mild constipation.   Yes [provider]  ?Multiple Vitamin (MULTIVITAMIN) tablet Take 1 tablet by mouth daily.   Yes [provider]  ?oxybutynin (DITROPAN) 5 MG tablet Take 5 mg by mouth daily. 09/18/20  Yes [provider]  ?vitamin B-12 (CYANOCOBALAMIN) 100 MCG tablet Take 100 mcg by mouth daily. 09/18/20  Yes [provider]  ?feeding supplement (ENSURE ENLIVE / ENSURE PLUS) LIQD Take 237 mLs by mouth 2 (two) times daily between meals. ?Patient not taking: Reported on 10/07/2021 12/19/20   Annita Brod, MD  ?heparin lock flush 100 UNIT/ML SOLN injection 2.5 mLs (250 Units total) by Intracatheter route 2 (two) times a week. Inject 2.5 ml to each lumen of PICC line twice weekly after NS flush ?Patient not taking: Reported on 10/07/2021 12/24/20   Ladell Pier, MD  ?nystatin (MYCOSTATIN) 100000 UNIT/ML suspension Take 5 mLs (500,000 Units total) by mouth 4 (four) times daily. ?Patient not taking: Reported on 10/07/2021 01/14/21   Derek Jack, MD  ?potassium chloride SA (KLOR-CON M20) 20 MEQ tablet TAKE 1 TABLET BY MOUTH EVERY DAY ?Patient not taking: Reported on 10/07/2021 05/11/21   Derek Jack, MD  ?sodium chloride flush 0.9 % SOLN injection 10 mLs by Intracatheter route 2 (two) times a week. Inject  into both PICC line ports twice weekly ?Patient not taking: Reported on 10/07/2021 12/24/20   Ladell Pier, MD  ?zolpidem (AMBIEN) 5 MG tablet Take 1 tablet (5 mg total) by mouth at bedtime as needed. for sleep ?Patient not taking: Reported on 10/07/2021 03/25/21   Derek Jack, MD  ?   ? ?Allergies    ?Penicillin g   ? ?Review of Systems   ?Review of Systems  ?Constitutional:  Negative for chills and fever.  ?Respiratory:  Negative for shortness of  breath.   ?Cardiovascular:  Negative for chest pain.  ?Gastrointestinal:  Negative for abdominal pain.  ?Neurological:  Negative for headaches.  ? ?Physical Exam ?Updated Vital Signs ?BP 121/62   Pulse 77   Temp 97.8 ?F (36.6 ?C) (Oral)   Resp 18   Ht 5' 6.93" (1.7 m)   Wt 72 kg   SpO2 99%   BMI 24.91 kg/m?  ?Physical Exam ?Vitals and nursing note reviewed.  ?Constitutional:   ?   General: He is not in acute distress. ?   Appearance: He is not ill-appearing.  ?HENT:  ?   Head: Normocephalic and atraumatic.  ?   Nose: No congestion.  ?Eyes:  ?   Conjunctiva/sclera: Conjunctivae normal.  ?Cardiovascular:  ?   Rate and Rhythm: Normal rate and regular rhythm.  ?   Pulses: Normal pulses.  ?   Heart sounds: No murmur heard. ?  No friction rub. No gallop.  ?Pulmonary:  ?   Effort: No respiratory distress.  ?   Breath sounds: No wheezing, rhonchi or rales.  ?Abdominal:  ?   Palpations: Abdomen is soft.  ?   Tenderness: There is no abdominal tenderness. There is no right CVA tenderness or left CVA tenderness.  ?   Comments: Patient has an colostomy in the left upper quadrant, there was stool noted in the bag, no evidence of hematochezia or melena, Hemoccult was negative, stoma shows no evidence of infection .  abdomen soft nontender palpation no guarding rebound tenderness or peritoneal sign.  ?Musculoskeletal:  ?   Right lower leg: No edema.  ?   Left lower leg: No edema.  ?Skin: ?   General: Skin is warm and dry.  ?Neurological:  ?   Mental Status: He is alert.  ?   Comments: No facial asymmetry no difficulty word finding following two-step commands no unilateral weakness present.  ?Psychiatric:     ?   Mood and Affect: Mood normal.  ? ? ?ED Results / Procedures / Treatments   ?Labs ?(all labs ordered are listed, but only abnormal results are displayed) ?Labs Reviewed  ?CBC WITH DIFFERENTIAL/PLATELET - Abnormal; Notable for the following components:  ?    Result Value  ? RBC 3.94 (*)   ? Platelets 112 (*)   ? All  other components within normal limits  ?URINALYSIS, ROUTINE W REFLEX MICROSCOPIC - Abnormal; Notable for the following components:  ? APPearance HAZY (*)   ? pH 9.0 (*)   ? All other components within normal limits  ?COMPREHENSIVE METABOLIC PANEL  ?LIPASE, BLOOD  ?POC OCCULT BLOOD, ED  ? ? ?EKG ?None ? ?Radiology ?CT ABDOMEN PELVIS W CONTRAST ? ?Result Date: 10/07/2021 ?CLINICAL DATA:  Constipation. History of rectal cancer and prostate cancer. Patient has a left lower quadrant colostomy. EXAM: CT ABDOMEN AND PELVIS WITH CONTRAST TECHNIQUE: Multidetector CT imaging of the abdomen and pelvis was performed using the standard protocol following bolus administration of intravenous contrast. RADIATION DOSE REDUCTION:  This exam was performed according to the departmental dose-optimization program which includes automated exposure control, adjustment of the mA and/or kV according to patient size and/or use of iterative reconstruction technique. CONTRAST:  141m OMNIPAQUE IOHEXOL 300 MG/ML  SOLN COMPARISON:  CT scan 10/04/2021 FINDINGS: Lower chest: Stable pulmonary scarring changes. No acute pulmonary process. Stable aortic and coronary artery calcifications. Stable small hiatal hernia. Hepatobiliary: Stable cirrhotic changes involving the liver but no hepatic lesions or intrahepatic biliary dilatation. 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 Numerous layering gallstones again noted in the gallbladder but no findings for acute cholecystitis. No common bile duct dilatation. Pancreas: No mass, inflammation or ductal dilatation. Spleen: Normal size.  No focal lesions. Adrenals/Urinary Tract: The adrenal glands are unremarkable. Small midpole left renal calculus and small upper pole left renal cyst. No worrisome renal lesions or hydronephrosis. The bladder is grossly normal. Stomach/Bowel: The stomach, duodenum, small bowel and colon are grossly normal. There is a left lower quadrant colostomy  with a small parastomal hernia containing a small bowel loop. No findings for incarceration or obstruction. Patient has a Hartmann's pouch. No findings suspicious for recurrent tumor. Typical postoperative soft tissue

## 2021-10-07 NOTE — Discharge Instructions (Signed)
Lab work imaging was all reassuring, likely he is having liquid stools because of the laxatives, I recommend cutting back, to single capful of MiraLAX daily and gradually reduce them until he has normal bowel movements. ? ?Please follow with PCP for further evaluation. ? ?Come back to the emergency department if you develop chest pain, shortness of breath, severe abdominal pain, uncontrolled nausea, vomiting, diarrhea. ? ?

## 2021-10-07 NOTE — ED Triage Notes (Signed)
Pt c/o no BM in the last 8 days. Pt was seen here Monday for same and sent home to take Miralax, but pt still isn't having any results.  ?

## 2021-11-27 DEATH — deceased

## 2021-12-08 ENCOUNTER — Other Ambulatory Visit (HOSPITAL_COMMUNITY): Payer: Medicare Other

## 2021-12-15 ENCOUNTER — Ambulatory Visit (HOSPITAL_COMMUNITY): Payer: Medicare Other | Admitting: Hematology

## 2022-08-25 IMAGING — CT CT ANGIO CHEST
2 of 6 series · 15 of 46 positions shown · IV contrast (Omnipaque or Isovue)
Comparison: 04/10/2019
COMPARISON: 04/10/2019

Addendum:
CLINICAL DATA: Weakness, loss of appetite, cough, lower extremity
swelling, concern for PE

EXAM:
CT ANGIOGRAPHY CHEST WITH CONTRAST
TECHNIQUE: Multidetector CT imaging of the chest was performed using the
standard protocol during bolus administration of intravenous
contrast. Multiplanar CT image reconstructions and MIPs were
obtained to evaluate the vascular anatomy.
CONTRAST:  100mL OMNIPAQUE IOHEXOL 350 MG/ML SOLN

[Series 5: pe axial thins · axial · 0.76mm/px · z∈[-543,-261]mm · 12 of 387 slices shown]
[im 17/387  lung]
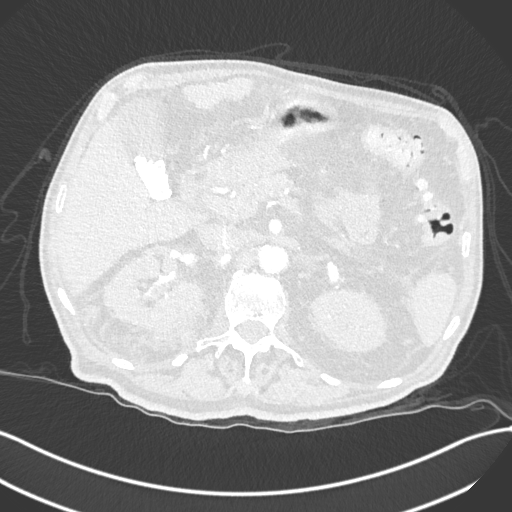
[im 49/387  soft-tissue]
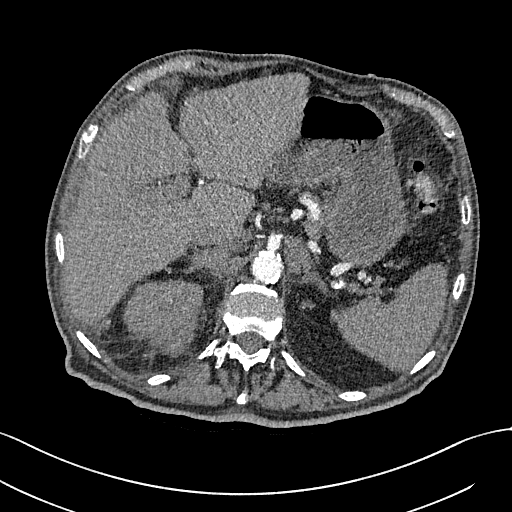
[im 81/387  lung]
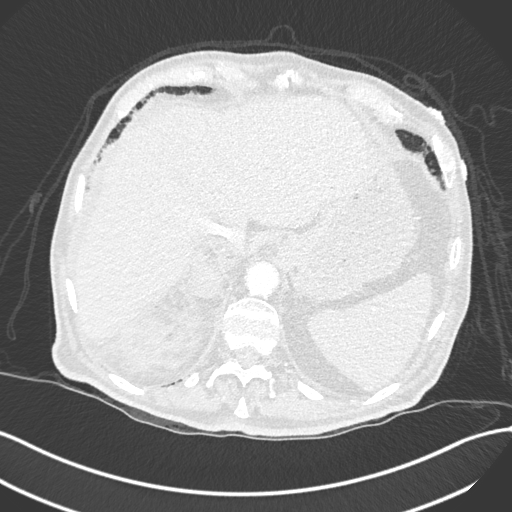
[im 113/387  soft-tissue]
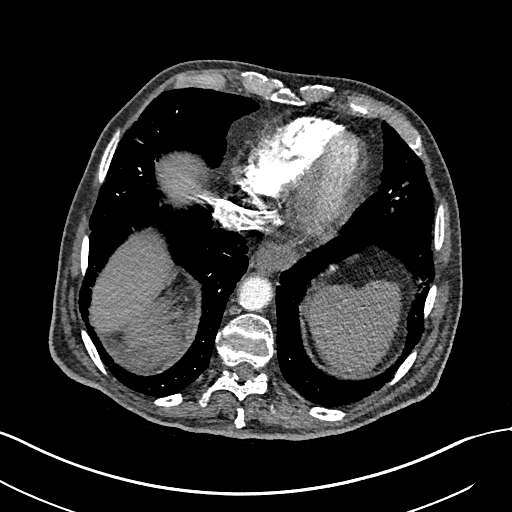
[im 145/387  lung]
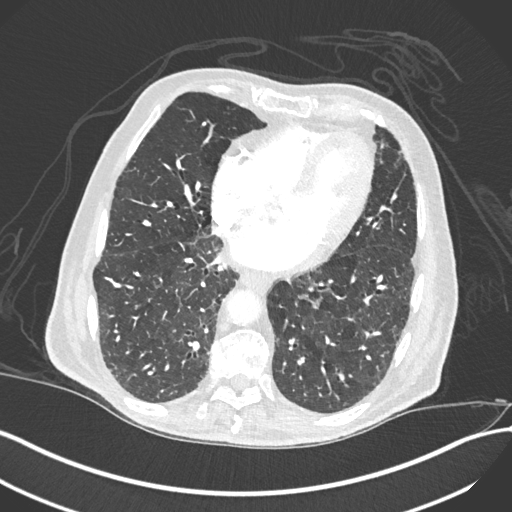
[im 177/387  soft-tissue]
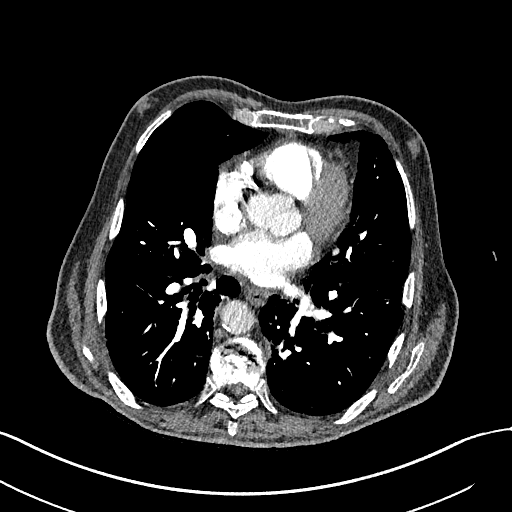
[im 210/387  lung]
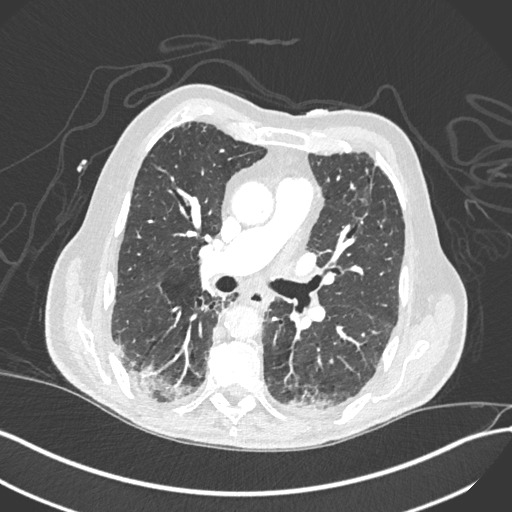
[im 242/387  soft-tissue]
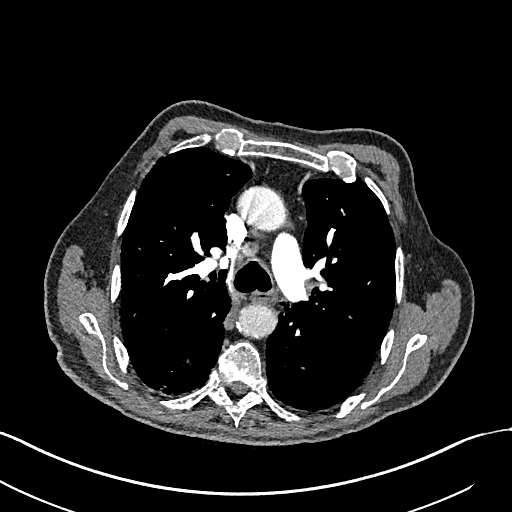
[im 274/387  lung]
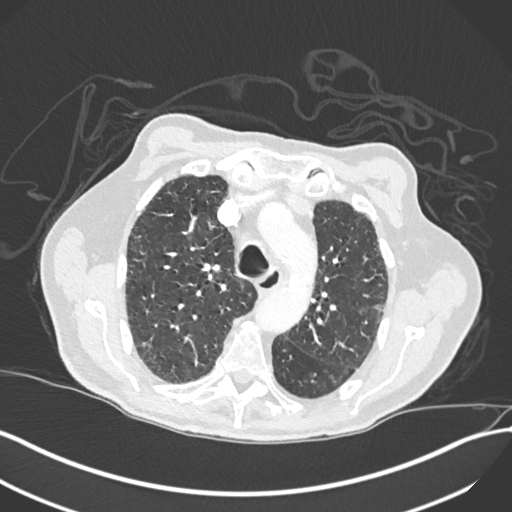
[im 306/387  soft-tissue]
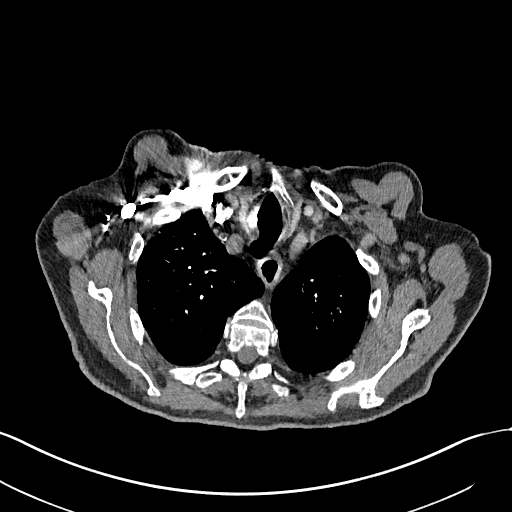
[im 338/387  lung]
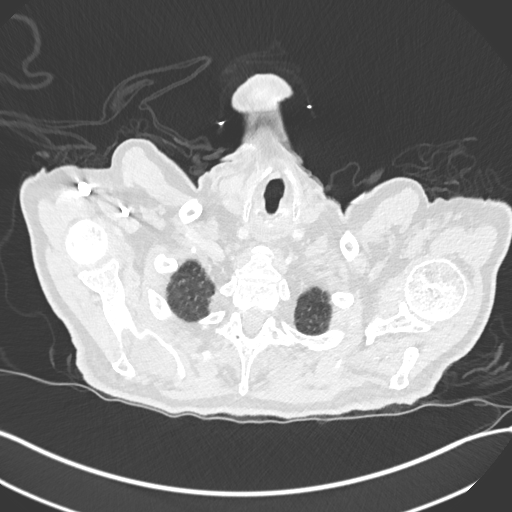
[im 370/387  soft-tissue]
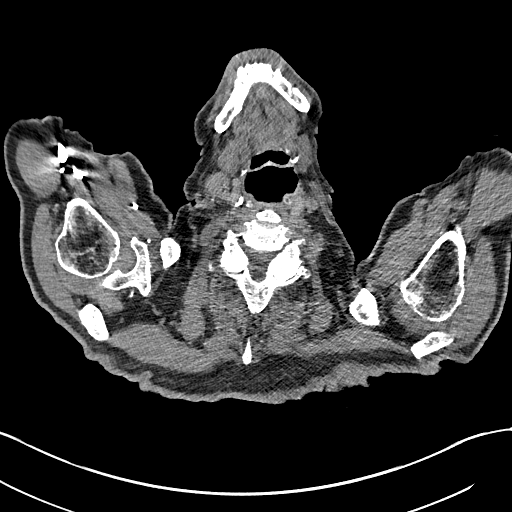

[Series 7: cor soft · coronal · 0.62mm/px · 3 of 166 slices shown]
[im 42/166  soft-tissue]
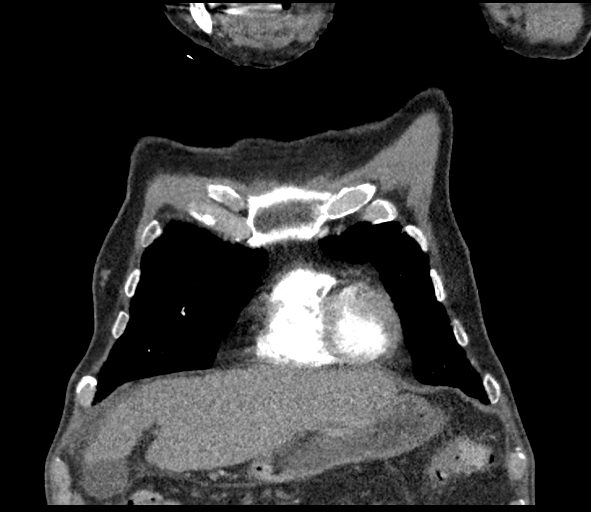
[im 83/166  soft-tissue]
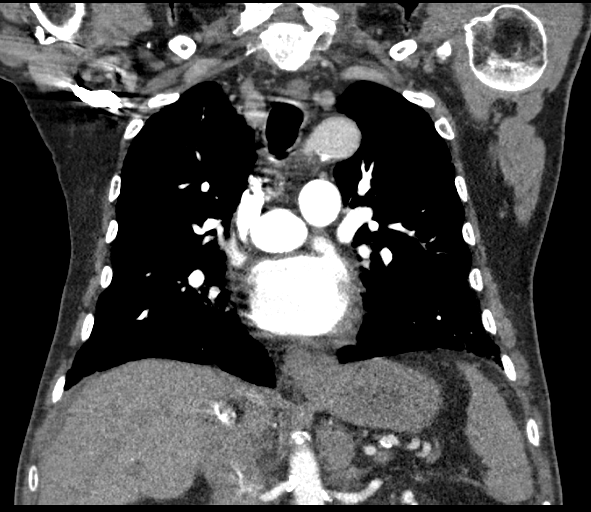
[im 124/166  soft-tissue]
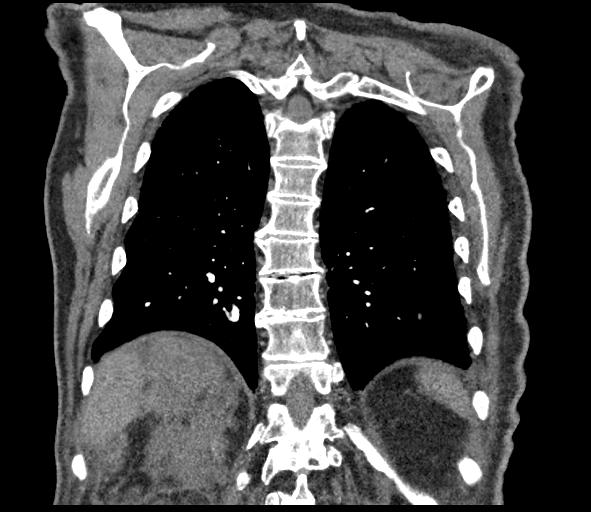

[15 of 46 positions shown; findings below may reference images not displayed]

FINDINGS: Cardiovascular: Satisfactory opacification of the pulmonary arteries
to the segmental level. No evidence of pulmonary embolism. Normal
heart size. Three-vessel coronary artery calcifications and/or
stents. No pericardial effusion. Aortic atherosclerosis.

Mediastinum/Nodes: No enlarged mediastinal, hilar, or axillary lymph
nodes. Small hiatal hernia. Thyroid gland, trachea, and esophagus
demonstrate no significant findings.

Lungs/Pleura: Moderate centrilobular emphysema. Scattered and
dependent bilateral ground-glass airspace opacity. Irregular
peripheral interstitial opacity interlobular septal thickening, most
conspicuous at the bilateral lung bases. No pleural effusion or
pneumothorax.

Upper Abdomen: Coarse, nodular cirrhotic morphology of the liver.
Trace perihepatic ascites. Numerous gallstones in gallbladder. There
are new bilateral adrenal nodules, measuring 3.7 x 2.6 cm on the
right (series 5, image 301) and 3.1 x 3.0 cm (series 5, image 347).
There is extensive, ill-defined soft tissue stranding about the
right kidney and within the adjacent perinephric fat (series 5,
image 285, 387).

Musculoskeletal: No chest wall abnormality. No acute or significant
osseous findings.

Review of the MIP images confirms the above findings.
IMPRESSION: 1. Negative examination for pulmonary embolism.
2. Scattered and dependent bilateral ground-glass airspace opacity
with irregular peripheral interstitial opacity and interlobular
septal thickening, most conspicuous at the bilateral lung bases.
Findings are nonspecific and infectious or inflammatory, including
1OHC8-DW airspace disease if clinically suspected.
3. New bilateral adrenal nodules, concerning for metastatic disease.
There is there is no intrathoracic evidence of malignancy.
4. There is extensive, ill-defined soft tissue stranding about the
right kidney and within the adjacent perinephric fat. This is also
highly suspicious for malignancy, perhaps lymphomatous involvement
of the right kidney given appearance.
5. Recommend dedicated contrast enhanced imaging of the abdomen and
pelvis to further evaluate above described abdominal findings.
6. Cholelithiasis.
7. Emphysema.
8. Coronary artery disease.

Aortic Atherosclerosis (3HMQB-ZGW.W) and Emphysema (3HMQB-XKA.3).

ADDENDUM:
Addendum to correct original report: Examination is positive for two
foci of subsegmental embolus present in the lingula and left lower
lobe.

Findings were discussed by telephone with Dr. Rtoyota at [DATE],
11/22/2020.

*** End of Addendum ***
FINDINGS: Cardiovascular: Satisfactory opacification of the pulmonary arteries
to the segmental level. No evidence of pulmonary embolism. Normal
heart size. Three-vessel coronary artery calcifications and/or
stents. No pericardial effusion. Aortic atherosclerosis.

Mediastinum/Nodes: No enlarged mediastinal, hilar, or axillary lymph
nodes. Small hiatal hernia. Thyroid gland, trachea, and esophagus
demonstrate no significant findings.

Lungs/Pleura: Moderate centrilobular emphysema. Scattered and
dependent bilateral ground-glass airspace opacity. Irregular
peripheral interstitial opacity interlobular septal thickening, most
conspicuous at the bilateral lung bases. No pleural effusion or
pneumothorax.

Upper Abdomen: Coarse, nodular cirrhotic morphology of the liver.
Trace perihepatic ascites. Numerous gallstones in gallbladder. There
are new bilateral adrenal nodules, measuring 3.7 x 2.6 cm on the
right (series 5, image 301) and 3.1 x 3.0 cm (series 5, image 347).
There is extensive, ill-defined soft tissue stranding about the
right kidney and within the adjacent perinephric fat (series 5,
image 285, 387).

Musculoskeletal: No chest wall abnormality. No acute or significant
osseous findings.

Review of the MIP images confirms the above findings.
IMPRESSION: 1. Negative examination for pulmonary embolism.
2. Scattered and dependent bilateral ground-glass airspace opacity
with irregular peripheral interstitial opacity and interlobular
septal thickening, most conspicuous at the bilateral lung bases.
Findings are nonspecific and infectious or inflammatory, including
1OHC8-DW airspace disease if clinically suspected.
3. New bilateral adrenal nodules, concerning for metastatic disease.
There is there is no intrathoracic evidence of malignancy.
4. There is extensive, ill-defined soft tissue stranding about the
right kidney and within the adjacent perinephric fat. This is also
highly suspicious for malignancy, perhaps lymphomatous involvement
of the right kidney given appearance.
5. Recommend dedicated contrast enhanced imaging of the abdomen and
pelvis to further evaluate above described abdominal findings.
6. Cholelithiasis.
7. Emphysema.
8. Coronary artery disease.

Aortic Atherosclerosis (3HMQB-ZGW.W) and Emphysema (3HMQB-XKA.3).

## 2022-08-29 IMAGING — CT CT ABD-PELV W/ CM
2 of 5 series · 15 of 46 positions shown, 17 images · IV contrast (APPLIED)
Comparison: 09/26/2017.

CLINICAL DATA: Primary Cancer Type: Rectal
TECHNIQUE: Multidetector CT imaging of the abdomen and pelvis was performed
using the standard protocol following bolus administration of
intravenous contrast.

CONTRAST:  80mL OMNIPAQUE IOHEXOL 300 MG/ML  SOLN

[Series 2: abd pel w · axial · 0.72mm/px · z∈[+975,+1365]mm · 12 of 88 slices shown, 14 images]
[im 5/88  soft-tissue]
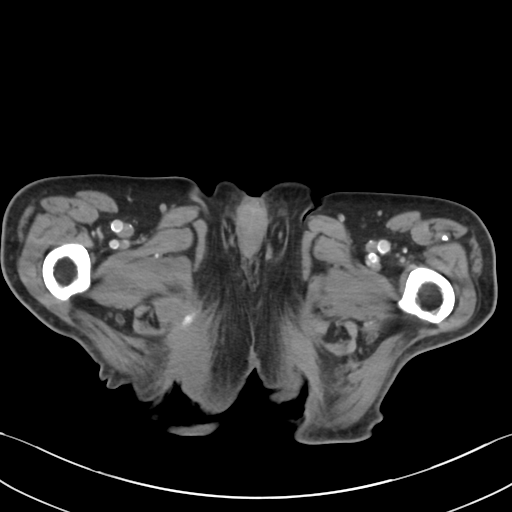
[im 5/88  bone]
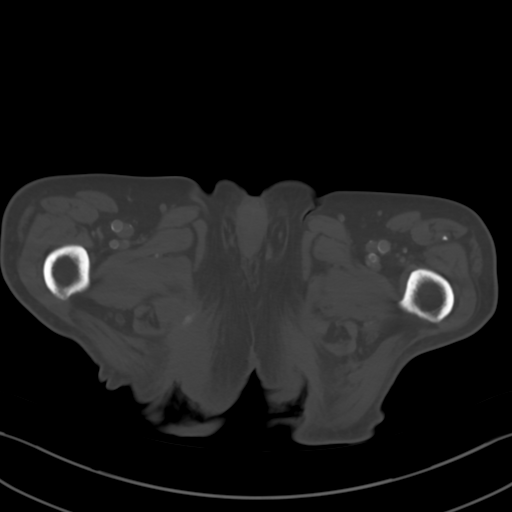
[im 14/88  soft-tissue]
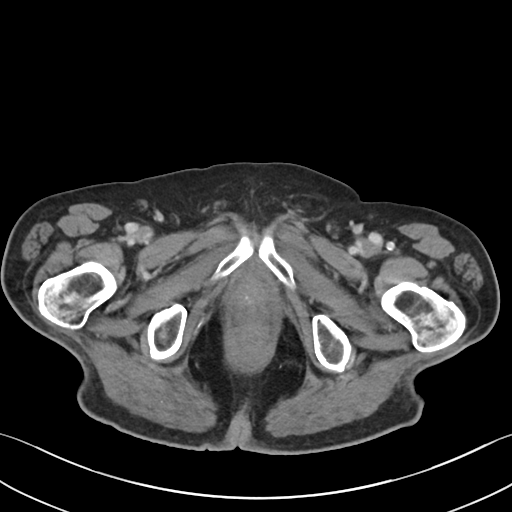
[im 19/88  soft-tissue]
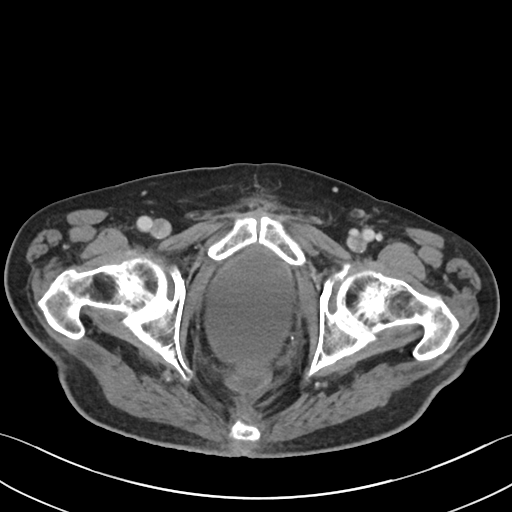
[im 28/88  soft-tissue]
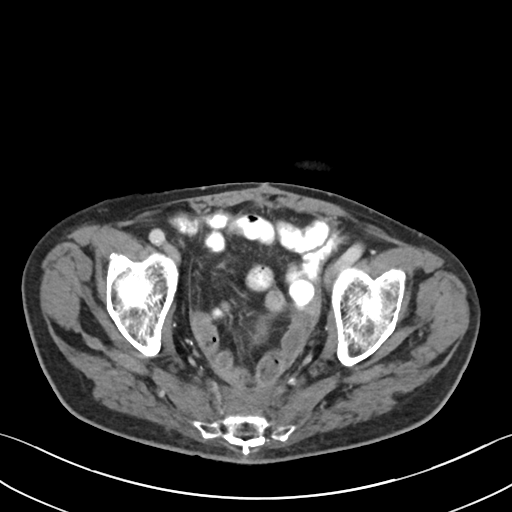
[im 33/88  soft-tissue]
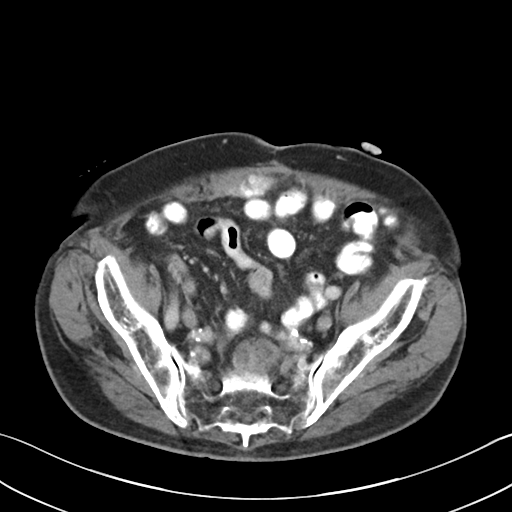
[im 42/88  soft-tissue]
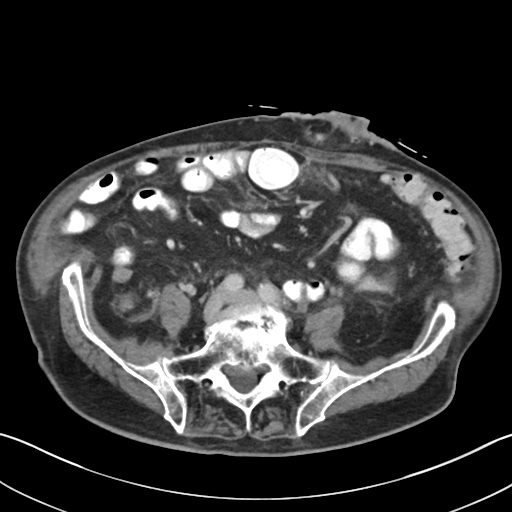
[im 46/88  soft-tissue]
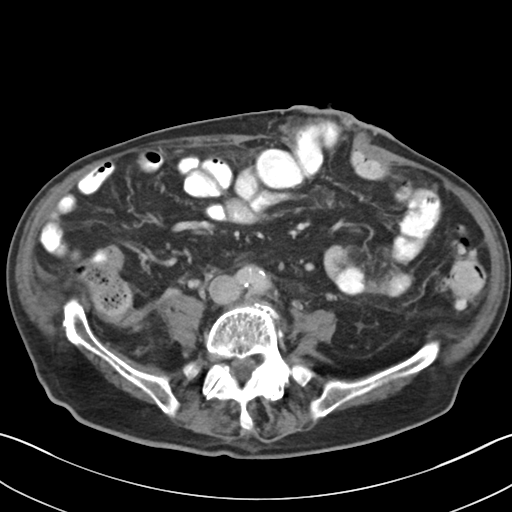
[im 55/88  soft-tissue]
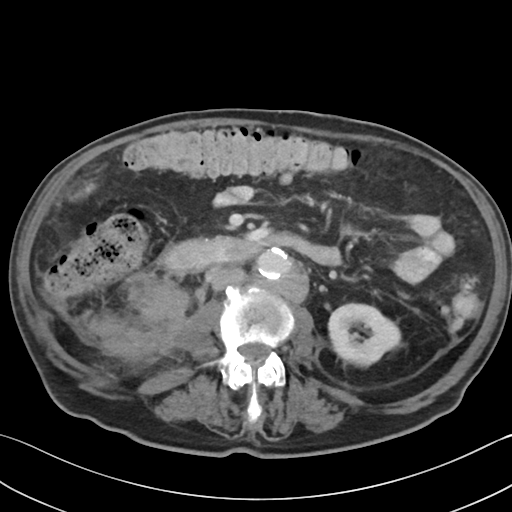
[im 60/88  soft-tissue]
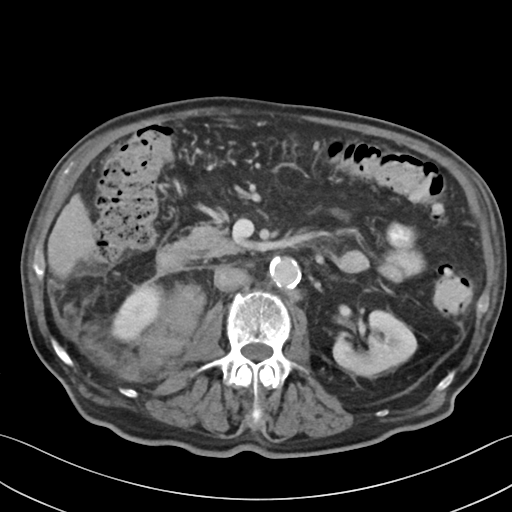
[im 60/88  bone]
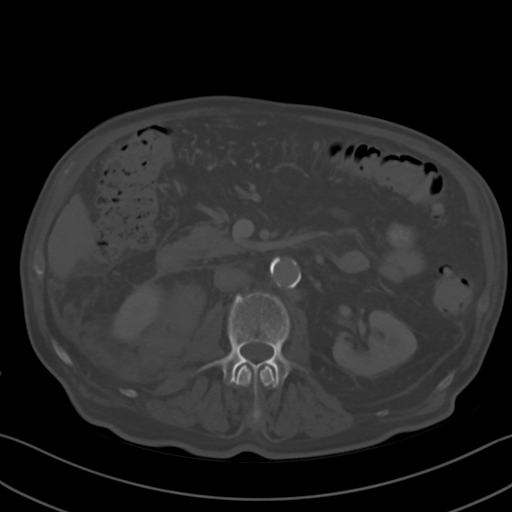
[im 69/88  soft-tissue]
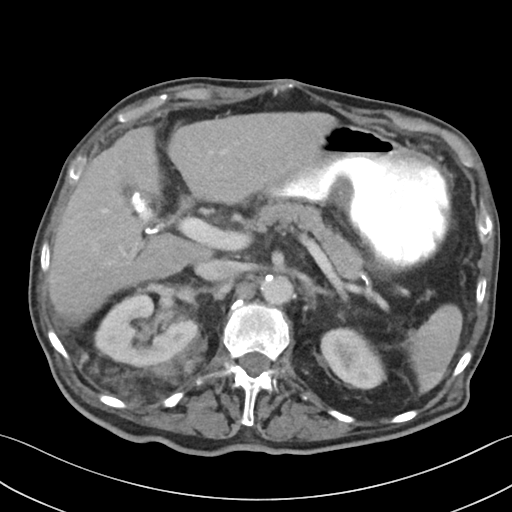
[im 74/88  soft-tissue]
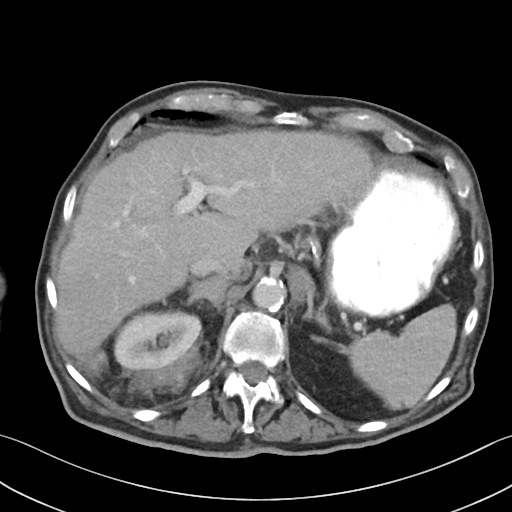
[im 83/88  soft-tissue]
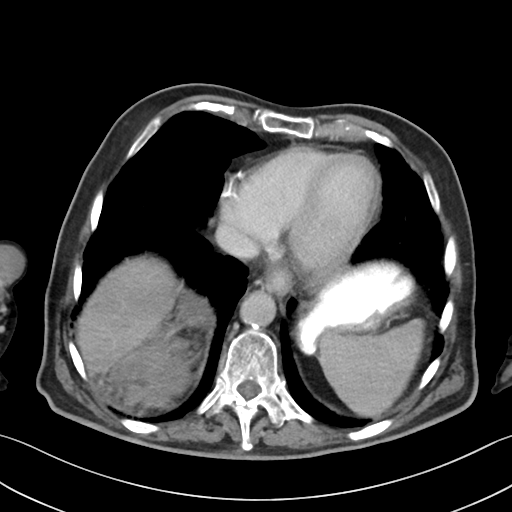

[Series 5: coronal · coronal · 0.81mm/px · 3 of 99 slices shown]
[im 33/99  soft-tissue]
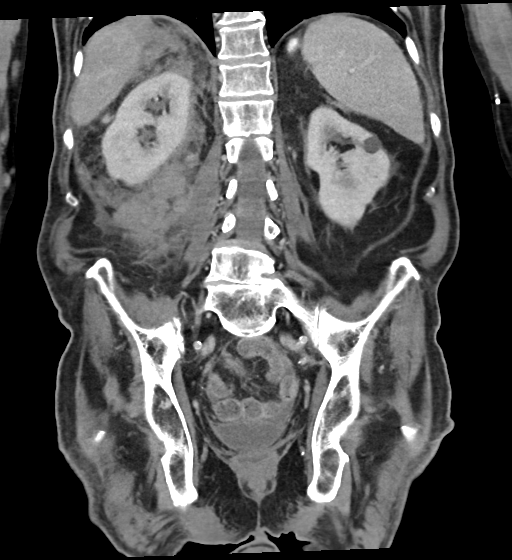
[im 44/99  soft-tissue]
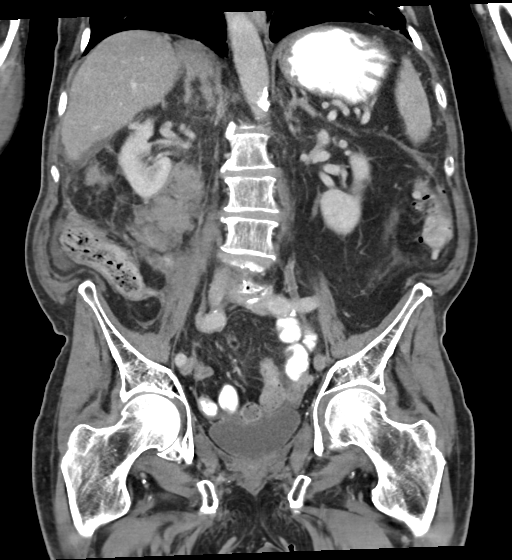
[im 55/99  soft-tissue]
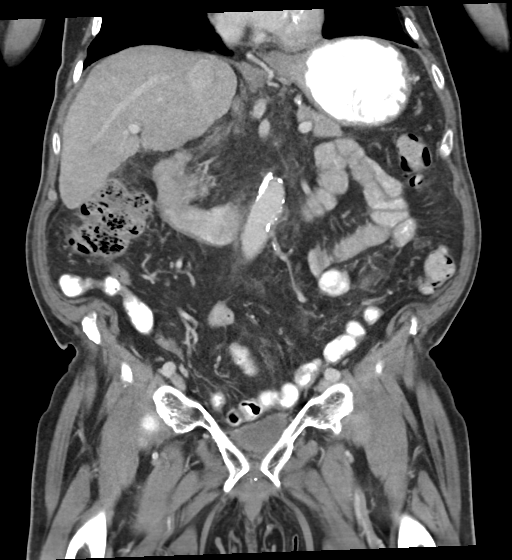

[15 of 46 positions shown; findings below may reference images not displayed]

Imaging Indication: Restaging for new clinical signs or symptoms

Interval therapy since last imaging? No

Initial Cancer Diagnosis

Date: 05/15/2015; established by: Biopsy-proven

Detailed Pathology: Stage III rectal cancer. Moderate to poorly
differentiated adenocarcinoma.

Primary Tumor location: Mass at 10 cm from the anus. New metastatic
disease involving the adrenal glands and perinephric fat.

Surgeries: Low anterior resection [DATE]. Descending colostomy and
Hartmann's pouch.

Chemotherapy: Yes; Ongoing?  No; Most recent administration: 6251

Immunotherapy? No

Radiation therapy?  Yes; Date Range: 5223

Other Cancer Therapies: Prostate cancer; prostatectomy 8448.

EXAM:
CT ABDOMEN AND PELVIS WITH CONTRAST
FINDINGS: Lower chest: No acute findings.

Hepatobiliary: The liver shows decreased steatosis but increased
capsular nodularity and caudate hypertrophy, highly suspicious for
cirrhosis. No hepatic masses are identified. Multiple calcified
gallstones are seen, however there is no evidence of cholecystitis
or biliary ductal dilatation.

Pancreas:  No mass or inflammatory changes.

Spleen:  Within normal limits in size and appearance.

Adrenals/Urinary Tract: New left adrenal mass is seen measuring
x 1.6 cm. New bilobed right adrenal mass is also seen with area
involving the body of the gland measuring 2.6 x 1 point 8 cm on
image [DATE]. No renal parenchymal masses are identified, however
there is bulky multinodular mass-like soft tissue density seen
throughout the right perinephric space and renal sinus, new since
prior exam. No evidence of ureteral calculi or hydronephrosis.
Unremarkable unopacified urinary bladder.

Stomach/Bowel: Postop changes are seen from previous low anterior
resection with left lower quadrant colostomy. A small parastomal
hernia is seen containing a small bowel loop, and a 2nd small
midline paraumbilical hernia is seen also containing a single small
bowel loop.

Presacral soft tissue density remains stable, consistent with post
treatment changes. Diverticulosis is again seen involving the
transverse and descending colon, without evidence of diverticulitis.
No evidence of bowel obstruction, focal inflammatory process, or
abscess.

Vascular/Lymphatic: New retroperitoneal lymphadenopathy is seen in
the left paraaortic region measuring 2.3 cm short axis. No pelvic
lymphadenopathy identified. No acute vascular findings. Aortic
atherosclerotic calcification noted.

Other:  Minimal ascites seen in right paracolic gutter.

Musculoskeletal:  No suspicious bone lesions identified.
IMPRESSION: New bilateral adrenal masses, consistent with metastatic disease.

Bulky multinodular mass-like soft tissue density throughout the
right perinephric space and renal sinus, consistent with malignancy.
Differential diagnosis includes metastatic disease and lymphoma.

New left paraaortic retroperitoneal lymphadenopathy, consistent with
malignancy. Differential diagnosis includes metastatic disease and
lymphoma.

Cirrhosis.  No evidence of hepatic neoplasm.

Two small ventral abdominal wall hernias, as described above.

Cholelithiasis. No radiographic evidence of cholecystitis.

Colonic diverticulosis, without radiographic evidence of
diverticulitis.

Aortic Atherosclerosis (6814E-IRJ.J).

## 2022-09-05 IMAGING — DX DG CHEST 1V PORT
1 series · 1 of 1 positions shown · non-contrast
Comparison: 11/22/2020

CLINICAL DATA: 89 y.o. male with history of rectal cancer last
treated on 4878-experiencing increasing weakness and poor appetite.
Patient was diagnosed with COVID infection about 2 weeks ago was
treated with antiviral and p.o. prednisone.

EXAM:
PORTABLE CHEST - 1 VIEW

[chest ap]
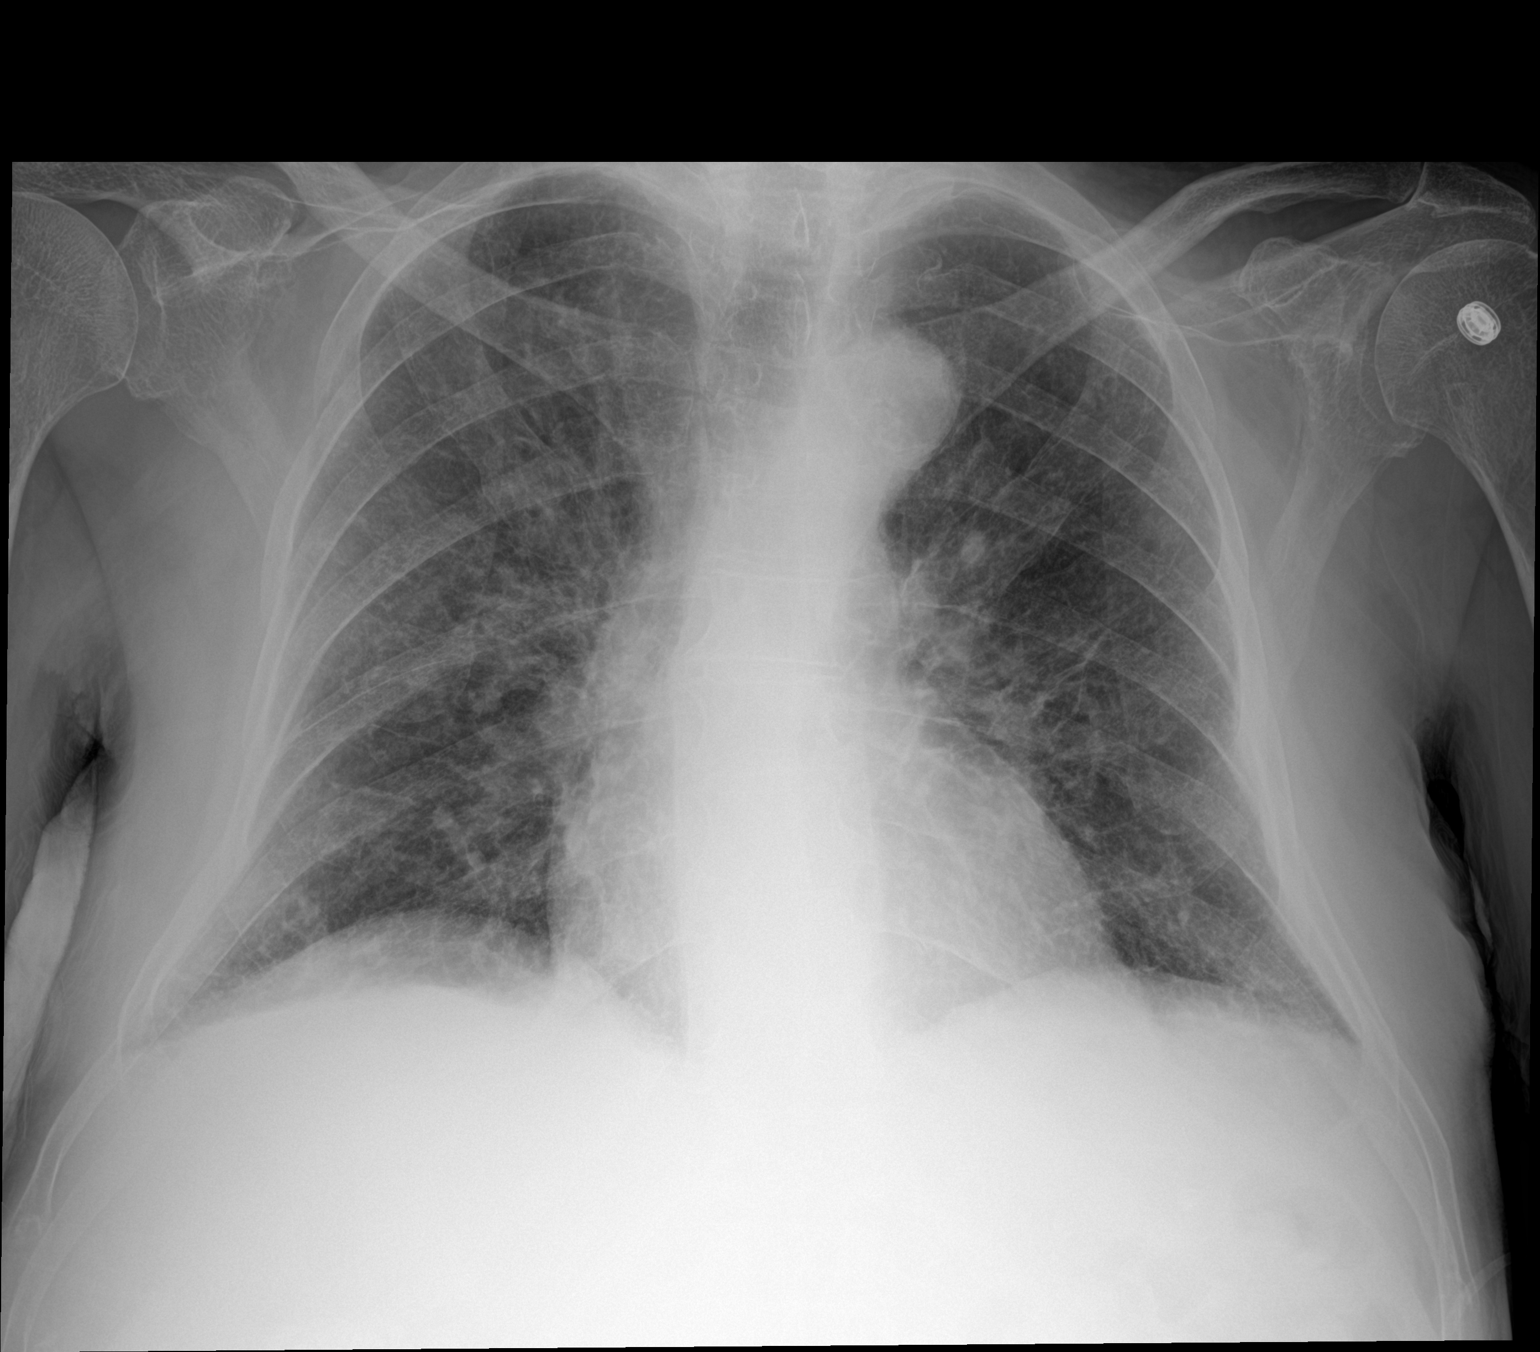

[1 of 1 positions shown; findings below may reference images not displayed]

FINDINGS: Worsening right mid lung and suprahilar interstitial opacities.
Coarse peripheral bronchovascular markings bilaterally as before.

Heart size and mediastinal contours are within normal limits. Aortic
Atherosclerosis ([id number]-170.0).

No effusion.  No pneumothorax.

Visualized bones unremarkable.
IMPRESSION: Interval worsening of right mid lung and suprahilar pulmonary
opacities since prior study
